# Patient Record
Sex: Female | Born: 1935 | Race: Black or African American | Hispanic: No | Marital: Married | Smoking: Never smoker
Health system: Southern US, Community
[De-identification: ages and names within clinical notes are randomized; demographics above are authoritative.]

## PROBLEM LIST (undated history)

## (undated) DIAGNOSIS — F411 Generalized anxiety disorder: Secondary | ICD-10-CM

## (undated) DIAGNOSIS — Z9889 Other specified postprocedural states: Secondary | ICD-10-CM

## (undated) DIAGNOSIS — K219 Gastro-esophageal reflux disease without esophagitis: Secondary | ICD-10-CM

## (undated) DIAGNOSIS — M1990 Unspecified osteoarthritis, unspecified site: Secondary | ICD-10-CM

## (undated) DIAGNOSIS — I4891 Unspecified atrial fibrillation: Secondary | ICD-10-CM

## (undated) DIAGNOSIS — F329 Major depressive disorder, single episode, unspecified: Secondary | ICD-10-CM

## (undated) DIAGNOSIS — M545 Low back pain, unspecified: Secondary | ICD-10-CM

## (undated) DIAGNOSIS — R112 Nausea with vomiting, unspecified: Secondary | ICD-10-CM

## (undated) DIAGNOSIS — I10 Essential (primary) hypertension: Secondary | ICD-10-CM

## (undated) DIAGNOSIS — E079 Disorder of thyroid, unspecified: Secondary | ICD-10-CM

## (undated) DIAGNOSIS — E7800 Pure hypercholesterolemia, unspecified: Secondary | ICD-10-CM

## (undated) DIAGNOSIS — R1310 Dysphagia, unspecified: Secondary | ICD-10-CM

## (undated) DIAGNOSIS — H9190 Unspecified hearing loss, unspecified ear: Secondary | ICD-10-CM

## (undated) DIAGNOSIS — F419 Anxiety disorder, unspecified: Secondary | ICD-10-CM

## (undated) DIAGNOSIS — M25551 Pain in right hip: Secondary | ICD-10-CM

## (undated) DIAGNOSIS — F32A Depression, unspecified: Secondary | ICD-10-CM

## (undated) DIAGNOSIS — G8929 Other chronic pain: Secondary | ICD-10-CM

## (undated) HISTORY — PX: KNEE ARTHROSCOPY: SHX127

## (undated) HISTORY — PX: ELBOW SURGERY: SHX618

## (undated) HISTORY — PX: ABDOMINAL HYSTERECTOMY: SHX81

---

## 1996-10-27 HISTORY — PX: BREAST LUMPECTOMY: SHX2

## 2001-03-20 ENCOUNTER — Emergency Department (HOSPITAL_COMMUNITY): Admission: EM | Admit: 2001-03-20 | Discharge: 2001-03-20 | Payer: Self-pay | Admitting: Emergency Medicine

## 2001-03-20 ENCOUNTER — Encounter: Payer: Self-pay | Admitting: Emergency Medicine

## 2002-01-13 ENCOUNTER — Encounter: Payer: Self-pay | Admitting: Internal Medicine

## 2002-01-13 ENCOUNTER — Ambulatory Visit (HOSPITAL_COMMUNITY): Admission: RE | Admit: 2002-01-13 | Discharge: 2002-01-13 | Payer: Self-pay | Admitting: Internal Medicine

## 2002-01-18 ENCOUNTER — Ambulatory Visit (HOSPITAL_COMMUNITY): Admission: RE | Admit: 2002-01-18 | Discharge: 2002-01-18 | Payer: Self-pay | Admitting: Internal Medicine

## 2002-01-18 ENCOUNTER — Encounter: Payer: Self-pay | Admitting: Internal Medicine

## 2002-04-13 ENCOUNTER — Ambulatory Visit (HOSPITAL_COMMUNITY): Admission: RE | Admit: 2002-04-13 | Discharge: 2002-04-13 | Payer: Self-pay | Admitting: General Surgery

## 2002-11-09 ENCOUNTER — Ambulatory Visit (HOSPITAL_COMMUNITY): Admission: RE | Admit: 2002-11-09 | Discharge: 2002-11-09 | Payer: Self-pay | Admitting: Internal Medicine

## 2002-11-09 ENCOUNTER — Encounter: Payer: Self-pay | Admitting: Internal Medicine

## 2004-06-18 ENCOUNTER — Ambulatory Visit (HOSPITAL_COMMUNITY): Admission: RE | Admit: 2004-06-18 | Discharge: 2004-06-18 | Payer: Self-pay | Admitting: General Surgery

## 2005-07-01 ENCOUNTER — Ambulatory Visit (HOSPITAL_COMMUNITY): Admission: RE | Admit: 2005-07-01 | Discharge: 2005-07-01 | Payer: Self-pay | Admitting: General Surgery

## 2006-07-27 ENCOUNTER — Ambulatory Visit (HOSPITAL_COMMUNITY): Admission: RE | Admit: 2006-07-27 | Discharge: 2006-07-27 | Payer: Self-pay | Admitting: Internal Medicine

## 2006-10-27 HISTORY — PX: TOTAL KNEE ARTHROPLASTY: SHX125

## 2007-08-06 ENCOUNTER — Ambulatory Visit (HOSPITAL_COMMUNITY): Admission: RE | Admit: 2007-08-06 | Discharge: 2007-08-06 | Payer: Self-pay | Admitting: Internal Medicine

## 2008-07-21 ENCOUNTER — Ambulatory Visit (HOSPITAL_COMMUNITY): Admission: RE | Admit: 2008-07-21 | Discharge: 2008-07-21 | Payer: Self-pay | Admitting: Internal Medicine

## 2008-09-06 ENCOUNTER — Ambulatory Visit (HOSPITAL_COMMUNITY): Admission: RE | Admit: 2008-09-06 | Discharge: 2008-09-06 | Payer: Self-pay | Admitting: Internal Medicine

## 2009-02-01 ENCOUNTER — Inpatient Hospital Stay (HOSPITAL_COMMUNITY): Admission: RE | Admit: 2009-02-01 | Discharge: 2009-02-05 | Payer: Self-pay | Admitting: Orthopedic Surgery

## 2009-02-22 ENCOUNTER — Emergency Department (HOSPITAL_COMMUNITY): Admission: EM | Admit: 2009-02-22 | Discharge: 2009-02-22 | Payer: Self-pay | Admitting: Emergency Medicine

## 2009-04-13 ENCOUNTER — Ambulatory Visit (HOSPITAL_COMMUNITY): Admission: RE | Admit: 2009-04-13 | Discharge: 2009-04-13 | Payer: Self-pay | Admitting: Internal Medicine

## 2009-09-25 ENCOUNTER — Ambulatory Visit (HOSPITAL_COMMUNITY): Admission: RE | Admit: 2009-09-25 | Discharge: 2009-09-25 | Payer: Self-pay | Admitting: Internal Medicine

## 2010-09-23 ENCOUNTER — Ambulatory Visit (HOSPITAL_COMMUNITY)
Admission: RE | Admit: 2010-09-23 | Discharge: 2010-09-23 | Payer: Self-pay | Source: Home / Self Care | Admitting: Internal Medicine

## 2010-11-12 ENCOUNTER — Ambulatory Visit (HOSPITAL_COMMUNITY)
Admission: RE | Admit: 2010-11-12 | Discharge: 2010-11-12 | Payer: Self-pay | Source: Home / Self Care | Attending: Orthopedic Surgery | Admitting: Orthopedic Surgery

## 2010-11-21 ENCOUNTER — Ambulatory Visit (HOSPITAL_COMMUNITY)
Admission: RE | Admit: 2010-11-21 | Discharge: 2010-11-21 | Payer: Self-pay | Source: Home / Self Care | Attending: Orthopedic Surgery | Admitting: Orthopedic Surgery

## 2010-11-21 LAB — BUN: BUN: 24 mg/dL — ABNORMAL HIGH (ref 6–23)

## 2010-11-21 LAB — CREATININE, SERUM
GFR calc Af Amer: 53 mL/min — ABNORMAL LOW (ref >60)
GFR calc non Af Amer: 44 mL/min — ABNORMAL LOW (ref >60)

## 2010-12-04 ENCOUNTER — Ambulatory Visit (HOSPITAL_COMMUNITY)
Admission: RE | Admit: 2010-12-04 | Discharge: 2010-12-04 | Disposition: A | Discharge status: Home / Self Care | Payer: MEDICARE | Source: Ambulatory Visit | Attending: Orthopedic Surgery | Admitting: Orthopedic Surgery

## 2010-12-04 DIAGNOSIS — M6281 Muscle weakness (generalized): Secondary | ICD-10-CM | POA: Insufficient documentation

## 2010-12-04 DIAGNOSIS — M545 Low back pain, unspecified: Secondary | ICD-10-CM | POA: Insufficient documentation

## 2010-12-04 DIAGNOSIS — IMO0001 Reserved for inherently not codable concepts without codable children: Secondary | ICD-10-CM | POA: Insufficient documentation

## 2010-12-04 DIAGNOSIS — R262 Difficulty in walking, not elsewhere classified: Secondary | ICD-10-CM | POA: Insufficient documentation

## 2010-12-04 DIAGNOSIS — I10 Essential (primary) hypertension: Secondary | ICD-10-CM | POA: Insufficient documentation

## 2010-12-10 ENCOUNTER — Ambulatory Visit (HOSPITAL_COMMUNITY)
Admission: RE | Admit: 2010-12-10 | Discharge: 2010-12-10 | Disposition: A | Discharge status: Home / Self Care | Payer: MEDICARE | Source: Ambulatory Visit | Attending: Orthopedic Surgery | Admitting: Orthopedic Surgery

## 2010-12-10 DIAGNOSIS — IMO0001 Reserved for inherently not codable concepts without codable children: Secondary | ICD-10-CM | POA: Insufficient documentation

## 2010-12-10 DIAGNOSIS — M545 Low back pain, unspecified: Secondary | ICD-10-CM | POA: Insufficient documentation

## 2010-12-10 DIAGNOSIS — M6281 Muscle weakness (generalized): Secondary | ICD-10-CM | POA: Insufficient documentation

## 2010-12-12 ENCOUNTER — Ambulatory Visit (HOSPITAL_COMMUNITY)
Admission: RE | Admit: 2010-12-12 | Discharge: 2010-12-12 | Disposition: A | Discharge status: Home / Self Care | Payer: MEDICARE | Source: Ambulatory Visit | Admitting: Physical Therapy

## 2011-01-09 ENCOUNTER — Ambulatory Visit (HOSPITAL_COMMUNITY)
Admission: RE | Admit: 2011-01-09 | Discharge: 2011-01-09 | Disposition: A | Discharge status: Home / Self Care | Payer: MEDICARE | Source: Ambulatory Visit | Attending: Orthopedic Surgery | Admitting: Orthopedic Surgery

## 2011-01-09 DIAGNOSIS — M6281 Muscle weakness (generalized): Secondary | ICD-10-CM | POA: Insufficient documentation

## 2011-01-09 DIAGNOSIS — M545 Low back pain, unspecified: Secondary | ICD-10-CM | POA: Insufficient documentation

## 2011-01-09 DIAGNOSIS — I10 Essential (primary) hypertension: Secondary | ICD-10-CM | POA: Insufficient documentation

## 2011-01-09 DIAGNOSIS — IMO0001 Reserved for inherently not codable concepts without codable children: Secondary | ICD-10-CM | POA: Insufficient documentation

## 2011-01-14 ENCOUNTER — Ambulatory Visit (HOSPITAL_COMMUNITY)
Admission: RE | Admit: 2011-01-14 | Discharge: 2011-01-14 | Disposition: A | Discharge status: Home / Self Care | Payer: MEDICARE | Source: Ambulatory Visit | Attending: *Deleted | Admitting: *Deleted

## 2011-01-16 ENCOUNTER — Ambulatory Visit (HOSPITAL_COMMUNITY)
Admission: RE | Admit: 2011-01-16 | Discharge: 2011-01-16 | Disposition: A | Discharge status: Home / Self Care | Payer: MEDICARE | Source: Ambulatory Visit | Attending: *Deleted | Admitting: *Deleted

## 2011-01-21 ENCOUNTER — Ambulatory Visit (HOSPITAL_COMMUNITY): Payer: Self-pay

## 2011-01-23 ENCOUNTER — Ambulatory Visit (HOSPITAL_COMMUNITY): Payer: Self-pay

## 2011-02-05 LAB — URINALYSIS, ROUTINE W REFLEX MICROSCOPIC
Hgb urine dipstick: NEGATIVE
Ketones, ur: NEGATIVE mg/dL
Nitrite: NEGATIVE
Specific Gravity, Urine: 1.021 (ref 1.005–1.030)
pH: 5.5 (ref 5.0–8.0)

## 2011-02-05 LAB — CROSSMATCH: ABO/RH(D): A POS

## 2011-02-05 LAB — COMPREHENSIVE METABOLIC PANEL
ALT: 9 U/L (ref 0–35)
Alkaline Phosphatase: 114 U/L (ref 39–117)
CO2: 24 mEq/L (ref 19–32)
Calcium: 8.9 mg/dL (ref 8.4–10.5)
Creatinine, Ser: 1.43 mg/dL — ABNORMAL HIGH (ref 0.4–1.2)
GFR calc Af Amer: 42 mL/min — ABNORMAL LOW (ref >60)
Glucose, Bld: 96 mg/dL (ref 70–99)

## 2011-02-05 LAB — CBC
Hemoglobin: 8 g/dL — ABNORMAL LOW (ref 12.0–15.0)
MCHC: 33.5 g/dL (ref 30.0–36.0)
MCHC: 33.5 g/dL (ref 30.0–36.0)
MCV: 90.5 fL (ref 78.0–100.0)
MCV: 90.6 fL (ref 78.0–100.0)
MCV: 92 fL (ref 78.0–100.0)
Platelets: 125 10*3/uL — ABNORMAL LOW (ref 150–400)
Platelets: 194 10*3/uL (ref 150–400)
RBC: 2.22 MIL/uL — ABNORMAL LOW (ref 3.87–5.11)
RBC: 2.6 MIL/uL — ABNORMAL LOW (ref 3.87–5.11)
RDW: 16.2 % — ABNORMAL HIGH (ref 11.5–15.5)
RDW: 16.2 % — ABNORMAL HIGH (ref 11.5–15.5)
WBC: 13.1 10*3/uL — ABNORMAL HIGH (ref 4.0–10.5)
WBC: 13.4 10*3/uL — ABNORMAL HIGH (ref 4.0–10.5)
WBC: 16.5 10*3/uL — ABNORMAL HIGH (ref 4.0–10.5)

## 2011-02-05 LAB — APTT: aPTT: 29 seconds (ref 24–37)

## 2011-02-05 LAB — PROTIME-INR
INR: 1 (ref 0.00–1.49)
INR: 1.7 — ABNORMAL HIGH (ref 0.00–1.49)
INR: 2 — ABNORMAL HIGH (ref 0.00–1.49)
Prothrombin Time: 15.4 seconds — ABNORMAL HIGH (ref 11.6–15.2)
Prothrombin Time: 21.1 seconds — ABNORMAL HIGH (ref 11.6–15.2)

## 2011-02-05 LAB — ABO/RH: ABO/RH(D): A POS

## 2011-02-05 LAB — GLUCOSE, CAPILLARY: Glucose-Capillary: 122 mg/dL — ABNORMAL HIGH (ref 70–99)

## 2011-02-05 LAB — TYPE AND SCREEN

## 2011-03-11 NOTE — Op Note (Signed)
NAME:  BEUNA, BOLDING NO.:  [id number]   MEDICAL RECORD NO.:  [id number]          PATIENT TYPE:  INP   LOCATION:  5002                         FACILITY:  [facility]   PHYSICIAN:  Myrtie Neither, MD      DATE OF BIRTH:  03-01-1921   DATE OF PROCEDURE:  02/01/2009  DATE OF DISCHARGE:                               OPERATIVE REPORT   PREOPERATIVE DIAGNOSIS:  Degenerative joint disease, left knee.   POSTOPERATIVE DIAGNOSIS:  Degenerative joint disease, left knee.   SURGEON:  Myrtie Neither, MD   ANESTHESIA:  General.   PROCEDURE:  Left total knee arthroplasty, Biomet implant.   The patient was taken to the operating room.  After given adequate preop  medications, given general anesthesia and intubated.  Left lower  extremity was prepped with DuraPrep and draped in sterile manner.  Tourniquet and Bovie was used for hemostasis.  Anterior midline incision  made over the left knee going on the tibial tubercle to the quadriceps.  Sharp and blunt dissection was made both medial and laterally down to  the capsule and medial paramedian incision was made into the capsule.  Patella was reflected laterally.  Extensive degenerative joint changes  were noted.  Osteophytes about the patella, femur, and tibia were  resected.  Soft tissue resection was also done and soft tissue release  medially due to her varus deformity.  With the knee in a flexed  position, tibial cutting jig was put in place and tibial plateau soft  surface resected.  Reaming down the femoral canal was then done, and  distal femoral cutting jig was put in place and distal femoral cut was  made.  Sizing of the femur was that of 70 mm.  Appropriate cutting block  was put in place.  Anterior and posterior femoral cuts as well as  chamfer cuts were made.  Other soft tissue resected about the femur was  also done.  Trial component was put in place and found to fit very snug.  Attention was turned back to the tibia.   Tibial surface was measured at  75 mm.  Appropriate cutting jig was put in place and appropriate cut for  the tibia was made.  With the femur and tibial trial components in  place, 2 more millimeters of the tibial surface was necessary to be  resected.  Finally with the femoral trial in place and 75 mm tibial tray  in place with a 12-mm poly, full extension, full flexion, good medial  and lateral stability.  Attention was turned to the patella, which was  sized large, 37 mm but very thin.  Appropriate cutting jig was put in  place.  Trial put in place.  Range of motion, full flexion, full  extension with subluxation of the patella laterally.  Lateral release of  the patella was necessary in order to get the patella to proper track.  All 3 trial components were removed.  Copious irrigation with irrigator  was done.  Cement was mixed.  Tibia and patellar components were  cemented, and the femoral component was press fitted.  Again  after the  cement had fit and excess methyl methacrylate removed, range of motion,  full flexion, full extension, good medial and lateral stability, and  good tracking of the patella.  Final polyethylene was put into place and  locked, which was 12 mm.  Tourniquet was let down, was released.  Hemostasis was obtained with a Bovie.  Further irrigation was done.  Wound closure was then done with some effort to put into  tightening the medial capsule.  A 0 Vicryl for the capsule, 2-0 for the  subcutaneous, skin staples for the skin.  Compressive dressing was  applied.  The patient tolerated the procedure quite well, went to  recovery room in stable and satisfactory condition.      Myrtie Neither, MD  Electronically Signed     Myrtie Neither, MD  Electronically Signed    AC/MEDQ  D:  02/01/2009  T:  02/02/2009  Job:  [id number]

## 2011-03-14 NOTE — Discharge Summary (Signed)
Michaela Tyler, SCHEIDERER NO.:  [id number]   MEDICAL RECORD NO.:  [id number]          PATIENT TYPE:  INP   LOCATION:  5002                         FACILITY:  [facility]   PHYSICIAN:  Myrtie Neither, MD      DATE OF BIRTH:  Oct 17, 1921   DATE OF ADMISSION:  02/01/2009  DATE OF DISCHARGE:  02/05/2009                               DISCHARGE SUMMARY   ADMITTING DIAGNOSIS:  Degenerative joint disease, left knee.   DISCHARGE DIAGNOSIS:  Degenerative joint disease, left knee.   COMPLICATIONS:  None.   INFECTIONS:  None.   OPERATION:  Left total knee arthroplasty done on February 01, 2009.   PERTINENT HISTORY:  This is an 77 year old female followed in the office  for severe degenerative joint disease involving her left knee, treated  with therapeutic injections, anti-inflammatories, pain medication, and  use of cane.  The patient's stability of the left knee progressively  worsened leading to increased loss of function.  Examination of the left  knee genu valgum +2 effusion, crepitus medial and lateral compartment  with some gross instability and valgus laxity.  Negative Homans test.  Neurovascular status was intact.  X-rays revealed slight subluxation of  the femur off of the tibia, sclerosis, osteophytes, and both medial and  lateral compartments.   HOSPITAL COURSE:  The patient underwent preop laboratory of CBC, EKG,  chest x-ray, PT/PTT, UA, and CMET.  The patient's laboratory was found  to be stable enough to undergo surgery.  The patient also had preop  medical evaluation by primary care doctor.   HOSPITAL COURSE:  The patient underwent left total knee arthroplasty on  February 01, 2009, tolerated procedure quite well.  Postop course, the  patient did develop acute blood loss anemia, was given transfusion and  started with physical therapy, partial weightbearing on the left side,  use of CPM, OT therapy, and case management evaluation.  The patient's  pain was brought under  control with the use of Percocet 1-2 q.4 p.r.n.  for pain, partial weightbearing on the left side with the use of walker.  The patient became stable enough to be discharged and was discharged  with home health and PT arranged, INR's.   DISCHARGE MEDICATIONS:  1. Continued on Coumadin 6 mg daily.  2. Robaxin 500 mg b.i.d.  3. Percocet 1-2 q.6 h. p.r.n. and to return to the office in 1 week.   DISCHARGE CONDITION:  The patient is discharged in stable and  satisfactory condition.      Myrtie Neither, MD  Electronically Signed     Myrtie Neither, MD  Electronically Signed    AC/MEDQ  D:  03/14/2009  T:  03/14/2009  Job:  [id number]

## 2011-03-14 NOTE — H&P (Signed)
[facility]  Patient:    Michaela Tyler, Michaela Tyler Visit Number: [id number] MRN: [id number]          Service Type: Attending:  Elpidio Anis, M.D. Dictated by:   Elpidio Anis, M.D. Adm. Date:  04/13/02                           History and Physical  HISTORY OF PRESENT ILLNESS:  The patient is a 77 year old female, with a history of colon polyps in the past.  She has no recent change in bowel habits, no rectal bleeding.  Hemoccults were negative last year but none have been done during this year.  No family history of colon cancer.  The patient is scheduled for colonoscopy.  PAST MEDICAL HISTORY:  1. Hypertension.  2. Gastroesophageal reflux disease.  3. Osteoarthritis.  4. Chronic anxiety disorder.  5. Hyperlipidemia.  MEDICATIONS:  1. Aciphex 20 mg q.d.  2. DynaCirc CR 10 mg q.d.  3. Alprazolam 0.5 mg t.i.d.  4. Arthrotec 75 mg q.d.  5. Zoloft 50 mg 1/2 tablet q.d.  6. Lipitor 10 mg q.d.  7. Lozol 1.25 mg q.d.  PAST SURGICAL HISTORY:  1. Hysterectomy.  2. Knee surgery.  3. Elbow surgery.  PHYSICAL EXAMINATION:  VITAL SIGNS:  Blood pressure 140/88, pulse 92, respirations 20.  Weight 293 pounds.  HEENT:  Unremarkable.  NECK:  Supple without JVD or bruits.  CHEST:  Clear to auscultation.  HEART:  Regular rate and rhythm without murmur, gallop, or rub.  ABDOMEN:  Soft, nontender, obese.  No masses.  RECTAL:  Previously normal, not repeated.  EXTREMITIES:  Decreased range of motion of both knees with crepitus bilaterally.  No effusion.  NEUROLOGIC:  No motor, sensory, or cerebellar deficit.  IMPRESSION:  History of colon polyps.  PLAN:  Colonoscopy. Dictated by:   Elpidio Anis, M.D. Attending:  Elpidio Anis, M.D. DD:  04/13/02 TD:  04/13/02 Job: [id number]

## 2012-02-07 ENCOUNTER — Emergency Department (HOSPITAL_COMMUNITY)
Admission: EM | Admit: 2012-02-07 | Discharge: 2012-02-07 | Disposition: A | Discharge status: Home / Self Care | Payer: Medicare Other | Attending: Emergency Medicine | Admitting: Emergency Medicine

## 2012-02-07 ENCOUNTER — Emergency Department (HOSPITAL_COMMUNITY): Payer: Medicare Other

## 2012-02-07 ENCOUNTER — Encounter (HOSPITAL_COMMUNITY): Payer: Self-pay | Admitting: *Deleted

## 2012-02-07 DIAGNOSIS — IMO0002 Reserved for concepts with insufficient information to code with codable children: Secondary | ICD-10-CM | POA: Insufficient documentation

## 2012-02-07 DIAGNOSIS — W010XXA Fall on same level from slipping, tripping and stumbling without subsequent striking against object, initial encounter: Secondary | ICD-10-CM | POA: Insufficient documentation

## 2012-02-07 DIAGNOSIS — S0083XA Contusion of other part of head, initial encounter: Secondary | ICD-10-CM

## 2012-02-07 DIAGNOSIS — Z23 Encounter for immunization: Secondary | ICD-10-CM | POA: Insufficient documentation

## 2012-02-07 DIAGNOSIS — E7800 Pure hypercholesterolemia, unspecified: Secondary | ICD-10-CM | POA: Insufficient documentation

## 2012-02-07 DIAGNOSIS — R51 Headache: Secondary | ICD-10-CM | POA: Insufficient documentation

## 2012-02-07 DIAGNOSIS — I10 Essential (primary) hypertension: Secondary | ICD-10-CM | POA: Insufficient documentation

## 2012-02-07 DIAGNOSIS — S0003XA Contusion of scalp, initial encounter: Secondary | ICD-10-CM | POA: Insufficient documentation

## 2012-02-07 DIAGNOSIS — W19XXXA Unspecified fall, initial encounter: Secondary | ICD-10-CM

## 2012-02-07 HISTORY — DX: Pure hypercholesterolemia, unspecified: E78.00

## 2012-02-07 HISTORY — DX: Essential (primary) hypertension: I10

## 2012-02-07 MED ORDER — TETANUS-DIPHTH-ACELL PERTUSSIS 5-2.5-18.5 LF-MCG/0.5 IM SUSP
0.5000 mL | Freq: Once | INTRAMUSCULAR | Status: AC
  Administered 2012-02-07: 0.5 mL via INTRAMUSCULAR
  Filled 2012-02-07: qty 0.5

## 2012-02-07 NOTE — ED Notes (Signed)
Pt to CT scan via stretcher.

## 2012-02-07 NOTE — ED Notes (Signed)
Pt states that she hit her foot on the side walk and it caused her to fall. She hit her face on her cheekbone and beside her right eye. Pt has abrasion to the right side of her eye. Pt denies loss of consciousness.

## 2012-02-07 NOTE — ED Provider Notes (Addendum)
History   This chart was scribed for Michaela Jakes, MD by Michaela Tyler. The patient was seen in room APA14/APA14 and the patient's care was started at 4:43 PM     CSN: [id number]  Arrival date & time 02/07/12  1417   First MD Initiated Contact with Patient 02/07/12 1623      Chief Complaint  Patient presents with  . Fall    (Consider location/radiation/quality/duration/timing/severity/associated sxs/prior treatment) HPI  Michaela Tyler is a 78 y.o. female who presents to the Emergency Department complaining of moderate, episodic fall onset today with associated symptoms of headache. The pt states "she fell, broke her glasses, while impacting the ground with the right side of her face". Pt has a hx of allergies, taking Lipitor. Pt denies LOC, neck pain, back pain, abd pain, fever, chills, sore throat, congestion, cough, SOB, nausea, vomiting, diarrhea, dysuria, visual changes since the fall, rash, bleeding problems, taking blood thinners.  PCP is Michaela Tyler.   Past Medical History  Diagnosis Date  . Hypertension   . High cholesterol     Past Surgical History  Procedure Date  . Abdominal hysterectomy   . Total knee arthroplasty   . Elbow surgery       History  Substance Use Topics  . Smoking status: Never Smoker   . Smokeless tobacco: Not on file  . Alcohol Use: No    OB History    Grav Para Term Preterm Abortions TAB SAB Ect Mult Living                  Review of Systems  All other systems reviewed and are negative.    10 Systems reviewed and all are negative for acute change except as noted in the HPI.    Allergies  Review of patient's allergies indicates no known allergies.  Home Medications  No current outpatient prescriptions on file.  BP 182/85  Pulse 74  Temp(Src) 97.6 F (36.4 C) (Oral)  Resp 20  Ht 5\' 9"  (1.753 m)  Wt 250 lb (113.399 kg)  BMI 36.92 kg/m2  SpO2 99%  Physical Exam  Nursing note and vitals reviewed. Constitutional:  She is oriented to person, place, and time. She appears well-developed and well-nourished.  HENT:  Right Ear: Tympanic membrane and external ear normal.  Left Ear: Tympanic membrane and external ear normal.  Nose: Nose normal.  Mouth/Throat: Oropharynx is clear and moist.       Brushing, abrasion 4 CM on the right cheek and lateral to the right eye. No hyphema, Sclera clear. Eyes track well.   Eyes: EOM are normal. Pupils are equal, round, and reactive to light.  Neck: Normal range of motion.  Cardiovascular: Normal rate, regular rhythm and normal heart sounds.  Exam reveals no gallop and no friction rub.   No murmur heard. Pulmonary/Chest: Effort normal and breath sounds normal. She has no wheezes. She has no rales.  Abdominal: Soft. Bowel sounds are normal.  Musculoskeletal: Normal range of motion. She exhibits no edema.  Lymphadenopathy:    She has no cervical adenopathy.  Neurological: She is alert and oriented to person, place, and time.  Skin: Skin is warm and dry.  Psychiatric: She has a normal mood and affect. Her behavior is normal.    ED Course  Procedures (including critical care time)  DIAGNOSTIC STUDIES: Oxygen Saturation is 99% on room air, normal by my interpretation.    COORDINATION OF CARE:     Labs Reviewed - No  data to display Ct Head Wo Contrast  02/07/2012  *RADIOLOGY REPORT*  Clinical Data:  Fall, hit right periorbital area  CT HEAD WITHOUT CONTRAST CT MAXILLOFACIAL WITHOUT CONTRAST  Technique:  Multidetector CT imaging of the head and maxillofacial structures were performed using the standard protocol without intravenous contrast. Multiplanar CT image reconstructions of the maxillofacial structures were also generated.  Comparison:  CT head dated 01/18/2002  CT HEAD  Findings: No evidence of parenchymal hemorrhage or extra-axial fluid collection. No mass lesion, mass effect, or midline shift.  No CT evidence of acute infarction.  Mild subcortical white matter  and periventricular small vessel ischemic changes.  Intracranial atherosclerosis.  Cerebral volume is age appropriate.  No ventriculomegaly.  The visualized paranasal sinuses are essentially clear. The mastoid air cells are unopacified.  No evidence of calvarial fracture.  IMPRESSION: No evidence of acute intracranial abnormality.  Mild small vessel ischemic changes with intracranial atherosclerosis.  CT MAXILLOFACIAL  Findings:   Soft tissue swelling lateral to the right orbit/zygoma (series 4/image 21).  No evidence of maxillofacial fracture.  The bilateral orbits, including the globes and retroconal soft tissues, are within normal limits.  Mild mucosal thickening in the bilateral maxillary sinuses.  The visualized paranasal sinuses and mastoid air cells are otherwise clear.  The cervical spine appears intact to C6-7.  Moderate multilevel degenerative changes.  IMPRESSION:  Soft tissue swelling lateral to the right orbit/zygoma.  Underlying globe/orbit is within normal limits.  No evidence of maxillofacial fracture.  Original Report Authenticated By: Charline Bills, M.D.   Ct Maxillofacial Wo Cm  02/07/2012  *RADIOLOGY REPORT*  Clinical Data:  Fall, hit right periorbital area  CT HEAD WITHOUT CONTRAST CT MAXILLOFACIAL WITHOUT CONTRAST  Technique:  Multidetector CT imaging of the head and maxillofacial structures were performed using the standard protocol without intravenous contrast. Multiplanar CT image reconstructions of the maxillofacial structures were also generated.  Comparison:  CT head dated 01/18/2002  CT HEAD  Findings: No evidence of parenchymal hemorrhage or extra-axial fluid collection. No mass lesion, mass effect, or midline shift.  No CT evidence of acute infarction.  Mild subcortical white matter and periventricular small vessel ischemic changes.  Intracranial atherosclerosis.  Cerebral volume is age appropriate.  No ventriculomegaly.  The visualized paranasal sinuses are essentially clear.  The mastoid air cells are unopacified.  No evidence of calvarial fracture.  IMPRESSION: No evidence of acute intracranial abnormality.  Mild small vessel ischemic changes with intracranial atherosclerosis.  CT MAXILLOFACIAL  Findings:   Soft tissue swelling lateral to the right orbit/zygoma (series 4/image 21).  No evidence of maxillofacial fracture.  The bilateral orbits, including the globes and retroconal soft tissues, are within normal limits.  Mild mucosal thickening in the bilateral maxillary sinuses.  The visualized paranasal sinuses and mastoid air cells are otherwise clear.  The cervical spine appears intact to C6-7.  Moderate multilevel degenerative changes.  IMPRESSION:  Soft tissue swelling lateral to the right orbit/zygoma.  Underlying globe/orbit is within normal limits.  No evidence of maxillofacial fracture.  Original Report Authenticated By: Charline Bills, M.D.     1. Fall   2. Facial contusion     4:50PM- EDP at bedside discusses treatment plan.   MDM   Fall resulting in contusion to right cheek right lateral part of I CT scans of head and face without any significant abnormalities. No other complaints of injury. No loss of consciousness.patient's tetanus is up-to-date.      I personally performed the services described in  this documentation, which was scribed in my presence. The recorded information has been reviewed and considered.     Michaela Jakes, MD 02/07/12 1810  Michaela Jakes, MD 02/07/12 [phone]

## 2012-02-07 NOTE — ED Notes (Signed)
Pt presents with swelling, pain and redness to right cheek bone. Per pt she fell and hit face on the ground. Pt denies LOC. No bleeding noted. Small laceration noted to right cheek bone just below eye. Pt a/ox4. Resp even and unlabored. NAD at this time. Pt sitting supine in stretcher. Stretcher in low locked position. Side rail up for pt safety. Call light within reach. Education on plan of care provided. Pt verbalized understanding. Awaiting MD evaluation and orders. Will continue to monitor.

## 2012-02-07 NOTE — Discharge Instructions (Signed)
Return to the emergency apartment as needed. And wash wounds with soap and water. Return for any evidence of developing infection. Followup with her primary care doctor as needed. CT scans today were all negative.

## 2012-03-08 ENCOUNTER — Encounter (HOSPITAL_COMMUNITY): Payer: Self-pay | Admitting: Pharmacist

## 2012-03-12 MED ORDER — FENTANYL CITRATE 0.05 MG/ML IJ SOLN
INTRAMUSCULAR | Status: AC
  Filled 2012-03-12: qty 2

## 2012-03-16 ENCOUNTER — Encounter (HOSPITAL_COMMUNITY): Payer: Self-pay

## 2012-03-16 ENCOUNTER — Encounter (HOSPITAL_COMMUNITY)
Admission: RE | Admit: 2012-03-16 | Discharge: 2012-03-16 | Disposition: A | Discharge status: Home / Self Care | Payer: Medicare Other | Source: Ambulatory Visit | Attending: Ophthalmology | Admitting: Ophthalmology

## 2012-03-16 HISTORY — DX: Nausea with vomiting, unspecified: R11.2

## 2012-03-16 HISTORY — DX: Depression, unspecified: F32.A

## 2012-03-16 HISTORY — DX: Major depressive disorder, single episode, unspecified: F32.9

## 2012-03-16 HISTORY — DX: Anxiety disorder, unspecified: F41.9

## 2012-03-16 HISTORY — DX: Gastro-esophageal reflux disease without esophagitis: K21.9

## 2012-03-16 HISTORY — DX: Unspecified osteoarthritis, unspecified site: M19.90

## 2012-03-16 HISTORY — DX: Unspecified hearing loss, unspecified ear: H91.90

## 2012-03-16 HISTORY — DX: Nausea with vomiting, unspecified: Z98.890

## 2012-03-16 LAB — BASIC METABOLIC PANEL
GFR calc Af Amer: 41 mL/min — ABNORMAL LOW (ref >90)
GFR calc non Af Amer: 36 mL/min — ABNORMAL LOW (ref >90)
Potassium: 4.3 mEq/L (ref 3.5–5.1)
Sodium: 138 mEq/L (ref 135–145)

## 2012-03-16 LAB — HEMOGLOBIN AND HEMATOCRIT, BLOOD: HCT: 33.2 % — ABNORMAL LOW (ref 36.0–46.0)

## 2012-03-16 MED ORDER — FLURBIPROFEN SODIUM 0.03 % OP SOLN: 1.0000 [drp] | OPHTHALMIC | Status: DC

## 2012-03-16 NOTE — Patient Instructions (Signed)
Your procedure is scheduled on:  Tuesday, 03/23/12  Report to [facility] at   0700 AM.  Call this number if you have problems the morning of surgery: [phone]   Remember:   Do not eat or drink   After Midnight.  Take these medicines the morning of surgery with A SIP OF WATER: Lotrel, xanax, and pain pill if needed   Do not wear jewelry, make-up or nail polish.  Do not wear lotions, powders, or perfumes. You may wear deodorant.  Do not bring valuables to the hospital.  Contacts, dentures or bridgework may not be worn into surgery.     Patients discharged the day of surgery will not be allowed to drive home.  Name and phone number of your driver: driver  Special Instructions: Use eye drops as directed.   Please read over the following fact sheets that you were given: Pain Booklet, Anesthesia Post-op Instructions and Care and Recovery After Surgery    Cataract Surgery  A cataract is a clouding of the lens of the eye. When a lens becomes cloudy, vision is reduced based on the degree and nature of the clouding. Surgery may be needed to improve vision. Surgery removes the cloudy lens and usually replaces it with a substitute lens (intraocular lens, IOL). LET YOUR EYE DOCTOR KNOW ABOUT:  Allergies to food or medicine.   Medicines taken including herbs, eyedrops, over-the-counter medicines, and creams.   Use of steroids (by mouth or creams).   Previous problems with anesthetics or numbing medicine.   History of bleeding problems or blood clots.   Previous surgery.   Other health problems, including diabetes and kidney problems.   Possibility of pregnancy, if this applies.  RISKS AND COMPLICATIONS  Infection.   Inflammation of the eyeball (endophthalmitis) that can spread to both eyes (sympathetic ophthalmia).   Poor wound healing.   If an IOL is inserted, it can later fall out of proper position. This is very uncommon.   Clouding of the part of your eye that holds an IOL in  place. This is called an "after-cataract." These are uncommon, but easily treated.  BEFORE THE PROCEDURE  Do not eat or drink anything except small amounts of water for 8 to 12 before your surgery, or as directed by your caregiver.   Unless you are told otherwise, continue any eyedrops you have been prescribed.   Talk to your primary caregiver about all other medicines that you take (both prescription and non-prescription). In some cases, you may need to stop or change medicines near the time of your surgery. This is most important if you are taking blood-thinning medicine.Do not stop medicines unless you are told to do so.   Arrange for someone to drive you to and from the procedure.   Do not put contact lenses in either eye on the day of your surgery.  PROCEDURE There is more than one method for safely removing a cataract. Your doctor can explain the differences and help determine which is best for you. Phacoemulsification surgery is the most common form of cataract surgery.  An injection is given behind the eye or eyedrops are given to make this a painless procedure.   A small cut (incision) is made on the edge of the clear, dome-shaped surface that covers the front of the eye (cornea).   A tiny probe is painlessly inserted into the eye. This device gives off ultrasound waves that soften and break up the cloudy center of the lens. This  makes it easier for the cloudy lens to be removed by suction.   An IOL may be implanted.   The normal lens of the eye is covered by a clear capsule. Part of that capsule is intentionally left in the eye to support the IOL.   Your surgeon may or may not use stitches to close the incision.  There are other forms of cataract surgery that require a larger incision and stiches to close the eye. This approach is taken in cases where the doctor feels that the cataract cannot be easily removed using phacoemulsification. AFTER THE PROCEDURE  When an IOL is  implanted, it does not need care. It becomes a permanent part of your eye and cannot be seen or felt.   Your doctor will schedule follow-up exams to check on your progress.   Review your other medicines with your doctor to see which can be resumed after surgery.   Use eyedrops or take medicine as prescribed by your doctor.  Document Released: 10/02/2011 Document Reviewed: 09/29/2011 [facility] Patient Information 2012 [place], [place].  PATIENT INSTRUCTIONS POST-ANESTHESIA  IMMEDIATELY FOLLOWING SURGERY:  Do not drive or operate machinery for the first twenty four hours after surgery.  Do not make any important decisions for twenty four hours after surgery or while taking narcotic pain medications or sedatives.  If you develop intractable nausea and vomiting or a severe headache please notify your doctor immediately.  FOLLOW-UP:  Please make an appointment with your surgeon as instructed. You do not need to follow up with anesthesia unless specifically instructed to do so.  WOUND CARE INSTRUCTIONS (if applicable):  Keep a dry clean dressing on the anesthesia/puncture wound site if there is drainage.  Once the wound has quit draining you may leave it open to air.  Generally you should leave the bandage intact for twenty four hours unless there is drainage.  If the epidural site drains for more than 36-48 hours please call the anesthesia department.  QUESTIONS?:  Please feel free to call your physician or the hospital operator if you have any questions, and they will be happy to assist you.

## 2012-03-23 ENCOUNTER — Encounter (HOSPITAL_COMMUNITY): Payer: Self-pay | Admitting: Anesthesiology

## 2012-03-23 ENCOUNTER — Encounter (HOSPITAL_COMMUNITY)
Admission: RE | Disposition: A | Discharge status: Home / Self Care | Payer: Self-pay | Source: Ambulatory Visit | Attending: Ophthalmology

## 2012-03-23 ENCOUNTER — Ambulatory Visit (HOSPITAL_COMMUNITY): Payer: Medicare Other | Admitting: Anesthesiology

## 2012-03-23 ENCOUNTER — Ambulatory Visit (HOSPITAL_COMMUNITY)
Admission: RE | Admit: 2012-03-23 | Discharge: 2012-03-23 | Disposition: A | Discharge status: Home / Self Care | Payer: Medicare Other | Source: Ambulatory Visit | Attending: Ophthalmology | Admitting: Ophthalmology

## 2012-03-23 ENCOUNTER — Encounter (HOSPITAL_COMMUNITY): Payer: Self-pay | Admitting: *Deleted

## 2012-03-23 DIAGNOSIS — Z79899 Other long term (current) drug therapy: Secondary | ICD-10-CM | POA: Insufficient documentation

## 2012-03-23 DIAGNOSIS — I10 Essential (primary) hypertension: Secondary | ICD-10-CM | POA: Insufficient documentation

## 2012-03-23 DIAGNOSIS — H2510 Age-related nuclear cataract, unspecified eye: Secondary | ICD-10-CM | POA: Insufficient documentation

## 2012-03-23 DIAGNOSIS — Z01812 Encounter for preprocedural laboratory examination: Secondary | ICD-10-CM | POA: Insufficient documentation

## 2012-03-23 HISTORY — PX: CATARACT EXTRACTION W/PHACO: SHX586

## 2012-03-23 SURGERY — PHACOEMULSIFICATION, CATARACT, WITH IOL INSERTION
Anesthesia: Monitor Anesthesia Care | Site: Eye | Laterality: Right | Wound class: Clean

## 2012-03-23 MED ORDER — LACTATED RINGERS IV SOLN: INTRAVENOUS | Status: DC

## 2012-03-23 MED ORDER — FLURBIPROFEN SODIUM 0.03 % OP SOLN
OPHTHALMIC | Status: AC
  Administered 2012-03-23: 1 [drp] via OPHTHALMIC
  Filled 2012-03-23: qty 2.5

## 2012-03-23 MED ORDER — CYCLOPENTOLATE-PHENYLEPHRINE 0.2-1 % OP SOLN
1.0000 [drp] | OPHTHALMIC | Status: AC
  Administered 2012-03-23 (×3): 1 [drp] via OPHTHALMIC

## 2012-03-23 MED ORDER — ONDANSETRON HCL 4 MG/2ML IJ SOLN: 4.0000 mg | Freq: Once | INTRAMUSCULAR | Status: DC

## 2012-03-23 MED ORDER — PHENYLEPHRINE HCL 2.5 % OP SOLN
OPHTHALMIC | Status: AC
  Administered 2012-03-23: 1 [drp] via OPHTHALMIC
  Filled 2012-03-23: qty 2

## 2012-03-23 MED ORDER — TETRACAINE 0.5 % OP SOLN OPTIME - NO CHARGE
OPHTHALMIC | Status: DC | PRN
  Administered 2012-03-23: 2 [drp] via OPHTHALMIC

## 2012-03-23 MED ORDER — MIDAZOLAM HCL 2 MG/2ML IJ SOLN
INTRAMUSCULAR | Status: AC
  Administered 2012-03-23: 2 mg via INTRAVENOUS
  Filled 2012-03-23: qty 2

## 2012-03-23 MED ORDER — LACTATED RINGERS IV SOLN
INTRAVENOUS | Status: DC
  Administered 2012-03-23: 08:00:00 via INTRAVENOUS

## 2012-03-23 MED ORDER — PHENYLEPHRINE HCL 2.5 % OP SOLN
1.0000 [drp] | OPHTHALMIC | Status: AC
  Administered 2012-03-23 (×3): 1 [drp] via OPHTHALMIC

## 2012-03-23 MED ORDER — CYCLOPENTOLATE-PHENYLEPHRINE 0.2-1 % OP SOLN
OPHTHALMIC | Status: AC
  Administered 2012-03-23: 1 [drp] via OPHTHALMIC
  Filled 2012-03-23: qty 2

## 2012-03-23 MED ORDER — ONDANSETRON HCL 4 MG/2ML IJ SOLN
INTRAMUSCULAR | Status: AC
  Filled 2012-03-23: qty 2

## 2012-03-23 MED ORDER — BSS IO SOLN
INTRAOCULAR | Status: DC | PRN
  Administered 2012-03-23: 15 mL via INTRAOCULAR

## 2012-03-23 MED ORDER — FLURBIPROFEN SODIUM 0.03 % OP SOLN
1.0000 [drp] | OPHTHALMIC | Status: AC
  Administered 2012-03-23 (×3): 1 [drp] via OPHTHALMIC

## 2012-03-23 MED ORDER — TETRACAINE HCL 0.5 % OP SOLN
1.0000 [drp] | OPHTHALMIC | Status: AC
  Administered 2012-03-23 (×3): 1 [drp] via OPHTHALMIC

## 2012-03-23 MED ORDER — ONDANSETRON HCL 4 MG/2ML IJ SOLN
4.0000 mg | Freq: Once | INTRAMUSCULAR | Status: AC
  Administered 2012-03-23: 4 mg via INTRAVENOUS

## 2012-03-23 MED ORDER — TETRACAINE HCL 0.5 % OP SOLN
OPHTHALMIC | Status: AC
  Administered 2012-03-23: 1 [drp] via OPHTHALMIC
  Filled 2012-03-23: qty 2

## 2012-03-23 MED ORDER — MIDAZOLAM HCL 2 MG/2ML IJ SOLN: 1.0000 mg | INTRAMUSCULAR | Status: DC | PRN

## 2012-03-23 MED ORDER — EPINEPHRINE HCL 1 MG/ML IJ SOLN
INTRAOCULAR | Status: DC | PRN
  Administered 2012-03-23: 08:00:00

## 2012-03-23 MED ORDER — PROVISC 10 MG/ML IO SOLN
INTRAOCULAR | Status: DC | PRN
  Administered 2012-03-23: 8.5 mg via INTRAOCULAR

## 2012-03-23 MED ORDER — MIDAZOLAM HCL 2 MG/2ML IJ SOLN
1.0000 mg | INTRAMUSCULAR | Status: AC | PRN
  Administered 2012-03-23: 2 mg via INTRAVENOUS
  Administered 2012-03-23: 1 mg via INTRAVENOUS
  Administered 2012-03-23: 2 mg via INTRAVENOUS

## 2012-03-23 SURGICAL SUPPLY — 22 items
CAPSULAR TENSION RING-AMO (OPHTHALMIC RELATED) IMPLANT
CLOTH BEACON ORANGE TIMEOUT ST (SAFETY) ×1 IMPLANT
EYE SHIELD UNIVERSAL CLEAR (GAUZE/BANDAGES/DRESSINGS) ×2 IMPLANT
GLOVE BIO SURGEON STRL SZ 6.5 (GLOVE) ×1 IMPLANT
GLOVE ECLIPSE 6.5 STRL STRAW (GLOVE) IMPLANT
GLOVE ECLIPSE 7.0 STRL STRAW (GLOVE) IMPLANT
GLOVE EXAM NITRILE LRG STRL (GLOVE) IMPLANT
GLOVE EXAM NITRILE MD LF STRL (GLOVE) ×1 IMPLANT
GLOVE SKINSENSE NS SZ6.5 (GLOVE)
GLOVE SKINSENSE STRL SZ6.5 (GLOVE) IMPLANT
HEALON 5 0.6 ML (INTRAOCULAR LENS) IMPLANT
KIT VITRECTOMY (OPHTHALMIC RELATED) IMPLANT
PAD ARMBOARD 7.5X6 YLW CONV (MISCELLANEOUS) ×1 IMPLANT
PROC W NO LENS (INTRAOCULAR LENS)
PROC W SPEC LENS (INTRAOCULAR LENS)
PROCESS W NO LENS (INTRAOCULAR LENS) IMPLANT
PROCESS W SPEC LENS (INTRAOCULAR LENS) IMPLANT
RING MALYGIN (MISCELLANEOUS) IMPLANT
SIGHTPATH CAT PROC W REG LENS (Ophthalmic Related) ×2 IMPLANT
TAPE CLOTH SOFT 2X10 (GAUZE/BANDAGES/DRESSINGS) ×1 IMPLANT
VISCOELASTIC ADDITIONAL (OPHTHALMIC RELATED) IMPLANT
WATER STERILE IRR 250ML POUR (IV SOLUTION) ×1 IMPLANT

## 2012-03-23 NOTE — Anesthesia Postprocedure Evaluation (Signed)
  Anesthesia Post-op Note  Patient: Michaela Tyler  Procedure(s) Performed: Procedure(s) (LRB): CATARACT EXTRACTION PHACO AND INTRAOCULAR LENS PLACEMENT (IOC) (Right)  Patient Location: Short Stay  Anesthesia Type: MAC  Level of Consciousness: awake  Airway and Oxygen Therapy: Patient Spontanous Breathing  Post-op Pain: none  Post-op Assessment: Post-op Vital signs reviewed, Patient's Cardiovascular Status Stable, Respiratory Function Stable, Patent Airway and No signs of Nausea or vomiting  Post-op Vital Signs: Reviewed and stable  Complications: No apparent anesthesia complications

## 2012-03-23 NOTE — Transfer of Care (Signed)
Immediate Anesthesia Transfer of Care Note  Patient: Michaela Tyler  Procedure(s) Performed: Procedure(s) (LRB): CATARACT EXTRACTION PHACO AND INTRAOCULAR LENS PLACEMENT (IOC) (Right)  Patient Location: PACU and Short Stay  Anesthesia Type: MAC  Level of Consciousness: awake, alert  and oriented  Airway & Oxygen Therapy: Patient Spontanous Breathing  Post-op Assessment: Report given to PACU RN  Post vital signs: Reviewed and stable  Complications: No apparent anesthesia complications

## 2012-03-23 NOTE — Anesthesia Preprocedure Evaluation (Signed)
Anesthesia Evaluation  Patient identified by MRN, date of birth, ID band Patient awake    Reviewed: Allergy & Precautions, H&P , NPO status , Patient's Chart, lab work & pertinent test results  History of Anesthesia Complications (+) PONV  Airway Mallampati: II      Dental  (+) Edentulous Upper and Edentulous Lower   Pulmonary neg pulmonary ROS,  breath sounds clear to auscultation        Cardiovascular hypertension, Pt. on medications Rhythm:Regular     Neuro/Psych PSYCHIATRIC DISORDERS Anxiety Depression    GI/Hepatic GERD-  Medicated and Controlled,  Endo/Other    Renal/GU      Musculoskeletal   Abdominal   Peds  Hematology   Anesthesia Other Findings   Reproductive/Obstetrics                           Anesthesia Physical Anesthesia Plan  ASA: II  Anesthesia Plan: MAC   Post-op Pain Management:    Induction: Intravenous  Airway Management Planned: Nasal Cannula  Additional Equipment:   Intra-op Plan:   Post-operative Plan:   Informed Consent: I have reviewed the patients History and Physical, chart, labs and discussed the procedure including the risks, benefits and alternatives for the proposed anesthesia with the patient or authorized representative who has indicated his/her understanding and acceptance.     Plan Discussed with:   Anesthesia Plan Comments:         Anesthesia Quick Evaluation  

## 2012-03-23 NOTE — Discharge Instructions (Signed)
Michaela Tyler  03/23/2012           [facility] Instructions [address]  [address]      1. Avoid closing eyes tightly. One often closes the eye tightly when laughing, talking, sneezing, coughing or if they feel irritated. At these times, you should be careful not to close your eyes tightly.  2. Instill eye drops as instructed. To instill drops in your eye, open it, look up and have someone gently pull the lower lid down and instill a couple of drops inside the lower lid.  3. Do not touch upper lid.  4. Take Advil or Tylenol for pain.  5. You may use either eye for near work, such as reading or sewing and you may watch television.  6. You may have your hair done at the beauty parlor at any time.  7. Wear dark glasses with or without your own glasses if you are in bright light.  8. Call our office at [phone] or [phone] if you have sharp pain in your eye or unusual symptoms.  9. Do not be concerned because vision in the operative eye is not good. It will not be good, no matter how successful the operation, until you get a special lens for it. Your old glasses will not be suited to the new eye that was operated on and you will not be ready for a new lens for about a month.  10. Follow up at the [facility] office today @ 2:30-3:00pm.    I have received a copy of the above instructions and will follow them.

## 2012-03-23 NOTE — Op Note (Signed)
Patient brought to the operating room and prepped and draped in the usual manner.  Lid speculum inserted in right eye.  Stab incision made at the twelve o'clock position.  Provisc instilled in the anterior chamber.   A 2.4 mm. Stab incision was made temporally.  An anterior capsulotomy was done with a bent 25 gauge needle.  The nucleus was hydrodissected.  The Phaco tip was inserted in the anterior chamber and the nucleus was emulsified.  CDE was 12.73.  The cortical material was then removed with the I and A tip.  Posterior capsule was the polished.  The anterior chamber was deepened with Provisc.  A 19.5 Diopter Rayner 570C IOL was then inserted in the capsular bag.  Provisc was then removed with the I and A tip.  The wound was then hydrated.  Patient sent to the Recovery Room in good condition with follow up in my office.

## 2012-03-23 NOTE — Brief Op Note (Signed)
The patient was re examined and there is no change in the patients condition since the original H and P. 

## 2012-03-25 ENCOUNTER — Encounter (HOSPITAL_COMMUNITY): Payer: Self-pay | Admitting: Ophthalmology

## 2012-04-16 ENCOUNTER — Encounter (HOSPITAL_COMMUNITY): Payer: Self-pay | Admitting: Pharmacy Technician

## 2012-04-16 ENCOUNTER — Encounter (HOSPITAL_COMMUNITY)
Admission: RE | Admit: 2012-04-16 | Discharge: 2012-04-16 | Payer: Medicare Other | Source: Ambulatory Visit | Admitting: Ophthalmology

## 2012-04-19 MED ORDER — ONDANSETRON HCL 4 MG/2ML IJ SOLN: 4.0000 mg | Freq: Once | INTRAMUSCULAR | Status: AC | PRN

## 2012-04-19 MED ORDER — FENTANYL CITRATE 0.05 MG/ML IJ SOLN: 25.0000 ug | INTRAMUSCULAR | Status: DC | PRN

## 2012-04-20 ENCOUNTER — Ambulatory Visit (HOSPITAL_COMMUNITY): Payer: Medicare Other | Admitting: Anesthesiology

## 2012-04-20 ENCOUNTER — Encounter (HOSPITAL_COMMUNITY): Payer: Self-pay | Admitting: Anesthesiology

## 2012-04-20 ENCOUNTER — Ambulatory Visit (HOSPITAL_COMMUNITY)
Admission: RE | Admit: 2012-04-20 | Discharge: 2012-04-20 | Disposition: A | Discharge status: Home / Self Care | Payer: Medicare Other | Source: Ambulatory Visit | Attending: Ophthalmology | Admitting: Ophthalmology

## 2012-04-20 ENCOUNTER — Encounter (HOSPITAL_COMMUNITY): Payer: Self-pay | Admitting: *Deleted

## 2012-04-20 ENCOUNTER — Encounter (HOSPITAL_COMMUNITY)
Admission: RE | Disposition: A | Discharge status: Home / Self Care | Payer: Self-pay | Source: Ambulatory Visit | Attending: Ophthalmology

## 2012-04-20 DIAGNOSIS — I10 Essential (primary) hypertension: Secondary | ICD-10-CM | POA: Insufficient documentation

## 2012-04-20 DIAGNOSIS — H2510 Age-related nuclear cataract, unspecified eye: Secondary | ICD-10-CM | POA: Insufficient documentation

## 2012-04-20 DIAGNOSIS — Z79899 Other long term (current) drug therapy: Secondary | ICD-10-CM | POA: Insufficient documentation

## 2012-04-20 HISTORY — PX: CATARACT EXTRACTION W/PHACO: SHX586

## 2012-04-20 SURGERY — PHACOEMULSIFICATION, CATARACT, WITH IOL INSERTION
Anesthesia: Monitor Anesthesia Care | Site: Eye | Laterality: Left | Wound class: Clean

## 2012-04-20 MED ORDER — EPINEPHRINE HCL 1 MG/ML IJ SOLN
INTRAOCULAR | Status: DC | PRN
  Administered 2012-04-20: 09:00:00

## 2012-04-20 MED ORDER — LACTATED RINGERS IV SOLN
INTRAVENOUS | Status: DC
  Administered 2012-04-20: 08:00:00 via INTRAVENOUS

## 2012-04-20 MED ORDER — PHENYLEPHRINE HCL 2.5 % OP SOLN
OPHTHALMIC | Status: AC
  Administered 2012-04-20: 1 [drp] via OPHTHALMIC
  Filled 2012-04-20: qty 2

## 2012-04-20 MED ORDER — MIDAZOLAM HCL 2 MG/2ML IJ SOLN
INTRAMUSCULAR | Status: AC
  Administered 2012-04-20: 2 mg via INTRAVENOUS
  Filled 2012-04-20: qty 2

## 2012-04-20 MED ORDER — PHENYLEPHRINE HCL 2.5 % OP SOLN
1.0000 [drp] | OPHTHALMIC | Status: AC
  Administered 2012-04-20 (×3): 1 [drp] via OPHTHALMIC

## 2012-04-20 MED ORDER — TETRACAINE HCL 0.5 % OP SOLN
OPHTHALMIC | Status: AC
  Administered 2012-04-20: 1 [drp] via OPHTHALMIC
  Filled 2012-04-20: qty 2

## 2012-04-20 MED ORDER — PROVISC 10 MG/ML IO SOLN
INTRAOCULAR | Status: DC | PRN
  Administered 2012-04-20: 8.5 mg via INTRAOCULAR

## 2012-04-20 MED ORDER — FLURBIPROFEN SODIUM 0.03 % OP SOLN
1.0000 [drp] | OPHTHALMIC | Status: AC | PRN
  Administered 2012-04-20 (×3): 1 [drp] via OPHTHALMIC

## 2012-04-20 MED ORDER — MIDAZOLAM HCL 2 MG/2ML IJ SOLN
INTRAMUSCULAR | Status: AC
  Filled 2012-04-20: qty 2

## 2012-04-20 MED ORDER — BSS IO SOLN
INTRAOCULAR | Status: DC | PRN
  Administered 2012-04-20: 15 mL via INTRAOCULAR

## 2012-04-20 MED ORDER — FLURBIPROFEN SODIUM 0.03 % OP SOLN
OPHTHALMIC | Status: AC
  Administered 2012-04-20: 1 [drp] via OPHTHALMIC
  Filled 2012-04-20: qty 2.5

## 2012-04-20 MED ORDER — CYCLOPENTOLATE-PHENYLEPHRINE 0.2-1 % OP SOLN
1.0000 [drp] | OPHTHALMIC | Status: AC
  Administered 2012-04-20 (×3): 1 [drp] via OPHTHALMIC

## 2012-04-20 MED ORDER — EPINEPHRINE HCL 1 MG/ML IJ SOLN
INTRAMUSCULAR | Status: AC
  Filled 2012-04-20: qty 1

## 2012-04-20 MED ORDER — MIDAZOLAM HCL 2 MG/2ML IJ SOLN
1.0000 mg | INTRAMUSCULAR | Status: DC | PRN
  Administered 2012-04-20 (×2): 2 mg via INTRAVENOUS

## 2012-04-20 MED ORDER — TETRACAINE HCL 0.5 % OP SOLN
1.0000 [drp] | OPHTHALMIC | Status: AC
  Administered 2012-04-20 (×3): 1 [drp] via OPHTHALMIC

## 2012-04-20 MED ORDER — CYCLOPENTOLATE-PHENYLEPHRINE 0.2-1 % OP SOLN
OPHTHALMIC | Status: AC
  Administered 2012-04-20: 1 [drp] via OPHTHALMIC
  Filled 2012-04-20: qty 2

## 2012-04-20 SURGICAL SUPPLY — 23 items

## 2012-04-20 NOTE — Discharge Instructions (Signed)
Michaela Tyler  04/20/2012           [facility] Instructions [address]- [place] [address]      1. Avoid closing eyes tightly. One often closes the eye tightly when laughing, talking, sneezing, coughing or if they feel irritated. At these times, you should be careful not to close your eyes tightly.  2. Instill eye drops as instructed. To instill drops in your eye, open it, look up and have someone gently pull the lower lid down and instill a couple of drops inside the lower lid.  3. Do not touch upper lid.  4. Take Advil or Tylenol for pain.  5. You may use either eye for near work, such as reading or sewing and you may watch television.  6. You may have your hair done at the beauty parlor at any time.  7. Wear dark glasses with or without your own glasses if you are in bright light.  8. Call our office at [phone] or [phone] if you have sharp pain in your eye or unusual symptoms.  9. Do not be concerned because vision in the operative eye is not good. It will not be good, no matter how successful the operation, until you get a special lens for it. Your old glasses will not be suited to the new eye that was operated on and you will not be ready for a new lens for about a month.  10. Follow up at the [facility] office.    I have received a copy of the above instructions and will follow them.

## 2012-04-20 NOTE — Op Note (Signed)
Patient brought to the operating room and prepped and draped in the usual manner.  Lid speculum inserted in left eye.  Stab incision made at the twelve o'clock position.  Provisc instilled in the anterior chamber.   A 2.4 mm. Stab incision was made temporally.  An anterior capsulotomy was done with a bent 25 gauge needle.  The nucleus was hydrodissected.  The Phaco tip was inserted in the anterior chamber and the nucleus was emulsified.  CDE was 7.70.  The cortical material was then removed with the I and A tip.  Posterior capsule was the polished.  The anterior chamber was deepened with Provisc.  A 21.0 Diopter Rayner 570C IOL was then inserted in the capsular bag.  Provisc was then removed with the I and A tip.  The wound was then hydrated.  Patient sent to the Recovery Room in good condition with follow up in my office.

## 2012-04-20 NOTE — Anesthesia Postprocedure Evaluation (Signed)
  Anesthesia Post-op Note  Patient: Michaela Tyler  Procedure(s) Performed: Procedure(s) (LRB): CATARACT EXTRACTION PHACO AND INTRAOCULAR LENS PLACEMENT (IOC) (Left)  Patient Location: PACU and Short Stay  Anesthesia Type: MAC  Level of Consciousness: awake, alert , oriented and patient cooperative  Airway and Oxygen Therapy: Patient Spontanous Breathing  Post-op Pain: none  Post-op Assessment: Post-op Vital signs reviewed, Patient's Cardiovascular Status Stable, Respiratory Function Stable, Patent Airway, No signs of Nausea or vomiting and Pain level controlled  Post-op Vital Signs: Reviewed and stable  Complications: No apparent anesthesia complications

## 2012-04-20 NOTE — Transfer of Care (Signed)
Immediate Anesthesia Transfer of Care Note  Patient: Michaela Tyler  Procedure(s) Performed: Procedure(s) (LRB): CATARACT EXTRACTION PHACO AND INTRAOCULAR LENS PLACEMENT (IOC) (Left)  Patient Location: PACU and Short Stay  Anesthesia Type: MAC  Level of Consciousness: awake, alert , oriented and patient cooperative  Airway & Oxygen Therapy: Patient Spontanous Breathing  Post-op Assessment: Report given to PACU RN, Post -op Vital signs reviewed and stable and Patient moving all extremities  Post vital signs: Reviewed and stable  Complications: No apparent anesthesia complications

## 2012-04-20 NOTE — H&P (Signed)
The patient was re examined and there is no change in the patients condition since the original H and P. 

## 2012-04-20 NOTE — Anesthesia Preprocedure Evaluation (Signed)
Anesthesia Evaluation  Patient identified by MRN, date of birth, ID band Patient awake    Reviewed: Allergy & Precautions, H&P , NPO status , Patient's Chart, lab work & pertinent test results  History of Anesthesia Complications (+) PONV  Airway Mallampati: II      Dental  (+) Edentulous Upper and Edentulous Lower   Pulmonary neg pulmonary ROS,  breath sounds clear to auscultation        Cardiovascular hypertension, Pt. on medications Rhythm:Regular     Neuro/Psych PSYCHIATRIC DISORDERS Anxiety Depression    GI/Hepatic GERD-  Medicated and Controlled,  Endo/Other    Renal/GU      Musculoskeletal   Abdominal   Peds  Hematology   Anesthesia Other Findings   Reproductive/Obstetrics                           Anesthesia Physical Anesthesia Plan  ASA: II  Anesthesia Plan: MAC   Post-op Pain Management:    Induction: Intravenous  Airway Management Planned: Nasal Cannula  Additional Equipment:   Intra-op Plan:   Post-operative Plan:   Informed Consent: I have reviewed the patients History and Physical, chart, labs and discussed the procedure including the risks, benefits and alternatives for the proposed anesthesia with the patient or authorized representative who has indicated his/her understanding and acceptance.     Plan Discussed with:   Anesthesia Plan Comments:         Anesthesia Quick Evaluation

## 2012-04-21 ENCOUNTER — Encounter (HOSPITAL_COMMUNITY): Payer: Self-pay | Admitting: Ophthalmology

## 2012-07-23 ENCOUNTER — Other Ambulatory Visit (HOSPITAL_COMMUNITY): Payer: Self-pay | Admitting: Internal Medicine

## 2012-07-23 DIAGNOSIS — Z139 Encounter for screening, unspecified: Secondary | ICD-10-CM

## 2012-07-27 ENCOUNTER — Ambulatory Visit (HOSPITAL_COMMUNITY)
Admission: RE | Admit: 2012-07-27 | Discharge: 2012-07-27 | Disposition: A | Discharge status: Home / Self Care | Payer: Medicare Other | Source: Ambulatory Visit | Attending: Internal Medicine | Admitting: Internal Medicine

## 2012-07-27 DIAGNOSIS — Z139 Encounter for screening, unspecified: Secondary | ICD-10-CM

## 2012-07-27 DIAGNOSIS — Z1231 Encounter for screening mammogram for malignant neoplasm of breast: Secondary | ICD-10-CM | POA: Insufficient documentation

## 2012-12-24 ENCOUNTER — Other Ambulatory Visit: Payer: Self-pay

## 2012-12-24 ENCOUNTER — Ambulatory Visit (HOSPITAL_COMMUNITY)
Admission: RE | Admit: 2012-12-24 | Discharge: 2012-12-24 | Disposition: A | Discharge status: Home / Self Care | Payer: Medicare Other | Source: Ambulatory Visit | Attending: Internal Medicine | Admitting: Internal Medicine

## 2013-01-12 ENCOUNTER — Encounter: Payer: Self-pay | Admitting: Cardiology

## 2013-01-12 ENCOUNTER — Ambulatory Visit (INDEPENDENT_AMBULATORY_CARE_PROVIDER_SITE_OTHER): Payer: Medicare Other | Admitting: Cardiology

## 2013-01-12 VITALS — BP 138/80 | HR 93 | Ht 68.0 in | Wt 260.0 lb

## 2013-01-12 DIAGNOSIS — I4891 Unspecified atrial fibrillation: Secondary | ICD-10-CM

## 2013-01-12 DIAGNOSIS — I447 Left bundle-branch block, unspecified: Secondary | ICD-10-CM

## 2013-01-12 DIAGNOSIS — Z9181 History of falling: Secondary | ICD-10-CM | POA: Insufficient documentation

## 2013-01-12 DIAGNOSIS — I482 Chronic atrial fibrillation, unspecified: Secondary | ICD-10-CM | POA: Insufficient documentation

## 2013-01-12 NOTE — Progress Notes (Signed)
HPI Michaela Tyler is a very pleasant yet anxious 79 year old married black female referred today by Dr. Felecia Shelling for atrial fibrillation,  palpitations, and left bundle-branch block.  He states that she will get anxious sometimes at night. She is not sure what triggers it. She will take a Xanax which seems to help. The anxiety is associated with a feeling that she can't get a good breath and her heart is speeding up. She denies any chest pain, presyncope or syncope. She has fallen 13 times in the past year. She is not on Coumadin. She is a left bundle branch block on her last couple EKGs.  She uses a cane. She lives with her husband independently.  Past Medical History  Diagnosis Date  . Hypertension   . High cholesterol   . GERD (gastroesophageal reflux disease)   . Arthritis   . Anxiety   . Depression   . Cataract   . Hearing reduced   . PONV (postoperative nausea and vomiting)   . H/O partial mastectomy   . Cancer     left breast    Current Outpatient Prescriptions  Medication Sig Dispense Refill  . ALPRAZolam (XANAX) 0.5 MG tablet Take 0.5 mg by mouth 3 (three) times daily as needed. For anxiety      . amLODipine (NORVASC) 5 MG tablet Take 5 mg by mouth daily.      Marland Kitchen aspirin EC 81 MG tablet Take 81 mg by mouth daily.      Marland Kitchen atorvastatin (LIPITOR) 40 MG tablet Take 40 mg by mouth daily.      . ferrous sulfate 325 (65 FE) MG tablet Take 325 mg by mouth daily with breakfast.      . HYDROcodone-acetaminophen (VICODIN) 5-500 MG per tablet Take 1 tablet by mouth daily. For arthritis pain       . losartan-hydrochlorothiazide (HYZAAR) 50-12.5 MG per tablet Take 1 tablet by mouth daily.       No current facility-administered medications for this visit.    No Known Allergies  No family history on file.  History   Social History  . Marital Status: Married    Spouse Name: N/A    Number of Children: N/A  . Years of Education: N/A   Occupational History  . Not on file.   Social  History Main Topics  . Smoking status: Never Smoker   . Smokeless tobacco: Not on file  . Alcohol Use: No  . Drug Use: No  . Sexually Active:    Other Topics Concern  . Not on file   Social History Narrative  . No narrative on file    ROS ALL NEGATIVE EXCEPT THOSE NOTED IN HPI  PE  General Appearance: well developed, well nourished in no acute distress, morbidly obese HEENT: symmetrical face, PERRLA, dentures Neck: no JVD, thyromegaly, or adenopathy, trachea midline Chest: symmetric without deformity Cardiac: PMI non-displaced, irregular rate and rhythm, normal S1, S2, no gallop or murmur Lung: clear to ausculation and percussion Vascular: Decreased pulses in lower extremities Abdominal: nondistended, nontender, good bowel sounds, no HSM, no bruits Extremities: no cyanosis, clubbing or edema, no sign of DVT, no varicosities  Skin: normal color, no rashes Neuro: alert and oriented x 3, non-focal Pysch: Anxious  EKG outside EKG reviewed from February 28 which is a left bundle branch block and a heart rate of 100-105.  BMET    Component Value Date/Time   NA 138 03/16/2012 1030   K 4.3 03/16/2012 1030   CL  103 03/16/2012 1030   CO2 23 03/16/2012 1030   GLUCOSE 109* 03/16/2012 1030   BUN 21 03/16/2012 1030   CREATININE 1.29* 03/16/2012 1030   CALCIUM 9.8 03/16/2012 1030   GFRNONAA 36* 03/16/2012 1030   GFRAA 41* 03/16/2012 1030    Lipid Panel  No results found for this basename: chol, trig, hdl, cholhdl, vldl, ldlcalc    CBC    Component Value Date/Time   WBC 16.5* 02/03/2009 0500   RBC 2.22* 02/03/2009 0500   HGB 10.5* 03/16/2012 1030   HCT 33.2* 03/16/2012 1030   PLT 114* 02/03/2009 0500   MCV 90.5 02/03/2009 0500   MCHC 35.1 02/03/2009 0500   RDW 16.2* 02/03/2009 0500

## 2013-01-12 NOTE — Patient Instructions (Addendum)
Your physician recommends that you schedule a follow-up appointment in: PRN  YOUR PHYSICIAN RECOMMENDS THAT YOU DO NOT GO TO THE EMERGENCY ROOM FOR ANXIETY

## 2013-01-12 NOTE — Assessment & Plan Note (Signed)
Her rate today is in the 80s. Because of her left bundle and at age, I do not want to put her on A-V node blocking drugs. I've encouraged her to take Xanax as needed for her anxiety episodes. This seems to help her symptoms of palpitations and heart speeding up. She is at high risk of falls and has fallen numerous times in the past. She is not a Coumadin candidate. Continue aspirin.  I've encouraged her not to go the emergency room or call 911 for her anxiety spells.

## 2013-11-03 ENCOUNTER — Other Ambulatory Visit (HOSPITAL_COMMUNITY): Payer: Self-pay | Admitting: Internal Medicine

## 2013-11-03 DIAGNOSIS — Z139 Encounter for screening, unspecified: Secondary | ICD-10-CM

## 2013-11-08 ENCOUNTER — Ambulatory Visit (HOSPITAL_COMMUNITY): Payer: Medicare Other

## 2013-11-11 ENCOUNTER — Ambulatory Visit (HOSPITAL_COMMUNITY)
Admission: RE | Admit: 2013-11-11 | Discharge: 2013-11-11 | Disposition: A | Discharge status: Home / Self Care | Payer: Medicare Other | Source: Ambulatory Visit | Attending: Internal Medicine | Admitting: Internal Medicine

## 2013-11-11 DIAGNOSIS — Z1231 Encounter for screening mammogram for malignant neoplasm of breast: Secondary | ICD-10-CM | POA: Insufficient documentation

## 2013-11-11 DIAGNOSIS — Z139 Encounter for screening, unspecified: Secondary | ICD-10-CM

## 2014-10-31 DIAGNOSIS — Z961 Presence of intraocular lens: Secondary | ICD-10-CM | POA: Diagnosis not present

## 2014-10-31 DIAGNOSIS — H26493 Other secondary cataract, bilateral: Secondary | ICD-10-CM | POA: Diagnosis not present

## 2014-10-31 DIAGNOSIS — H3531 Nonexudative age-related macular degeneration: Secondary | ICD-10-CM | POA: Diagnosis not present

## 2014-11-07 ENCOUNTER — Ambulatory Visit (HOSPITAL_COMMUNITY)
Admission: RE | Admit: 2014-11-07 | Discharge: 2014-11-07 | Disposition: A | Discharge status: Home / Self Care | Payer: Medicare Other | Source: Ambulatory Visit | Attending: Ophthalmology | Admitting: Ophthalmology

## 2014-11-07 ENCOUNTER — Encounter (HOSPITAL_COMMUNITY): Payer: Self-pay | Admitting: *Deleted

## 2014-11-07 ENCOUNTER — Encounter (HOSPITAL_COMMUNITY)
Admission: RE | Disposition: A | Discharge status: Home / Self Care | Payer: Self-pay | Source: Ambulatory Visit | Attending: Ophthalmology

## 2014-11-07 DIAGNOSIS — H2640 Unspecified secondary cataract: Secondary | ICD-10-CM | POA: Insufficient documentation

## 2014-11-07 DIAGNOSIS — H26492 Other secondary cataract, left eye: Secondary | ICD-10-CM | POA: Diagnosis not present

## 2014-11-07 HISTORY — PX: YAG LASER APPLICATION: SHX6189

## 2014-11-07 SURGERY — TREATMENT, USING YAG LASER
Anesthesia: LOCAL | Laterality: Left

## 2014-11-07 MED ORDER — TROPICAMIDE 1 % OP SOLN
OPHTHALMIC | Status: AC
  Filled 2014-11-07: qty 3

## 2014-11-07 MED ORDER — TROPICAMIDE 1 % OP SOLN
1.0000 [drp] | OPHTHALMIC | Status: AC
  Administered 2014-11-07 (×3): 1 [drp] via OPHTHALMIC

## 2014-11-07 NOTE — H&P (Signed)
The patient was re examined and there is no change in the patients condition since the original H and P. 

## 2014-11-07 NOTE — Discharge Instructions (Signed)
Michaela Tyler  11/07/2014     Instructions    Activity: No Restrictions.   Diet: Resume Diet you were on at home.   Pain Medication: Tylenol if Needed.   CONTACT YOUR DOCTOR IF YOU HAVE PAIN, REDNESS IN YOUR EYE, OR DECREASED VISION.   Follow-up: today between 1:00-2:00  with Michaela LericheMark [name], MD.   Dr. Nile Tyler: [phone]       If you find that you cannot contact your physician, but feel that your signs and   Symptoms warrant a physician's attention, call the Emergency Room at   [phone] ext.[phone].

## 2014-11-07 NOTE — Op Note (Signed)
Michaela Tyler [name], MD  Procedure: Yag Capsulotomy  Yag Laser Self Test Completedyes. Procedure: Posterior Capsulotomy, Eye Protection Worn by Staff yes. Laser In Use Sign on Door yes.  Laser: Nd:YAG Spot Size: Fixed Burst Mode: III Power Setting: 3.4 mJ/burst Number of shots: 35 Total energy delivered: 117 mJ   The patient tolerated the procedure without difficulty. No complications were encountered.   The patient was discharged home with the instructions to continue all her current glaucoma medications, if any.   Patient instructed to go to office at 0100 for intraocular pressure check.  Patient verbalizes understanding of discharge instructions Yes.  .    Pre-Operative Diagnosis: After-Cataract, obscuring vision, 366.53 OS Post-Operative Diagnosis: After-Cataract, obscuring vision, 366.53 OS

## 2014-11-08 ENCOUNTER — Encounter (HOSPITAL_COMMUNITY): Payer: Self-pay | Admitting: Ophthalmology

## 2014-11-14 DIAGNOSIS — F419 Anxiety disorder, unspecified: Secondary | ICD-10-CM | POA: Diagnosis not present

## 2014-11-14 DIAGNOSIS — E039 Hypothyroidism, unspecified: Secondary | ICD-10-CM | POA: Diagnosis not present

## 2014-11-14 DIAGNOSIS — I10 Essential (primary) hypertension: Secondary | ICD-10-CM | POA: Diagnosis not present

## 2014-11-14 DIAGNOSIS — K219 Gastro-esophageal reflux disease without esophagitis: Secondary | ICD-10-CM | POA: Diagnosis not present

## 2014-11-15 DIAGNOSIS — I10 Essential (primary) hypertension: Secondary | ICD-10-CM | POA: Diagnosis not present

## 2014-11-15 DIAGNOSIS — F419 Anxiety disorder, unspecified: Secondary | ICD-10-CM | POA: Diagnosis not present

## 2014-11-15 DIAGNOSIS — N182 Chronic kidney disease, stage 2 (mild): Secondary | ICD-10-CM | POA: Diagnosis not present

## 2014-11-20 NOTE — Discharge Instructions (Signed)
Michaela Tyler  11/20/2014     Instructions    Activity: No Restrictions.   Diet: Resume Diet you were on at home.   Pain Medication: Tylenol if Needed.   CONTACT YOUR DOCTOR IF YOU HAVE PAIN, REDNESS IN YOUR EYE, OR DECREASED VISION.   Follow-up: today between 2:00-3:00 with [name], MD.   Dr. Nile RiggsShapiro: [phone]      If you find that you cannot contact your physician, but feel that your signs and   Symptoms warrant a physician's attention, call the Emergency Room at   [phone] [phone].

## 2014-11-21 ENCOUNTER — Ambulatory Visit (HOSPITAL_COMMUNITY)
Admission: RE | Admit: 2014-11-21 | Discharge: 2014-11-21 | Disposition: A | Discharge status: Home / Self Care | Payer: Medicare Other | Source: Ambulatory Visit | Attending: Ophthalmology | Admitting: Ophthalmology

## 2014-11-21 ENCOUNTER — Encounter (HOSPITAL_COMMUNITY): Payer: Self-pay | Admitting: *Deleted

## 2014-11-21 ENCOUNTER — Encounter (HOSPITAL_COMMUNITY)
Admission: RE | Disposition: A | Discharge status: Home / Self Care | Payer: Self-pay | Source: Ambulatory Visit | Attending: Ophthalmology

## 2014-11-21 DIAGNOSIS — H26491 Other secondary cataract, right eye: Secondary | ICD-10-CM | POA: Insufficient documentation

## 2014-11-21 HISTORY — PX: YAG LASER APPLICATION: SHX6189

## 2014-11-21 SURGERY — TREATMENT, USING YAG LASER
Anesthesia: LOCAL | Laterality: Right

## 2014-11-21 MED ORDER — TROPICAMIDE 1 % OP SOLN
1.0000 [drp] | OPHTHALMIC | Status: AC
  Administered 2014-11-21 (×3): 1 [drp] via OPHTHALMIC

## 2014-11-21 MED ORDER — TROPICAMIDE 1 % OP SOLN
OPHTHALMIC | Status: AC
  Filled 2014-11-21: qty 3

## 2014-11-21 NOTE — H&P (Signed)
The patient was re examined and there is no change in the patients condition since the original H and P. 

## 2014-11-21 NOTE — Op Note (Signed)
Jacolyn Joaquin [name], MD  Procedure: Yag Capsulotomy  Yag Laser Self Test Completedyes. Procedure: Posterior Capsulotomy, Eye Protection Worn by Staff yes. Laser In Use Sign on Door yes.  Laser: Nd:YAG Spot Size: Fixed Burst Mode: III Power Setting: 3.4 mJ/burst Number of shots: 24 Total energy delivered: 79.95 mJ   The patient tolerated the procedure without difficulty. No complications were encountered.   The patient was discharged home with the instructions to continue all her current glaucoma medications, if any.   Patient instructed to go to office at 0100 for intraocular pressure check.  Patient verbalizes understanding of discharge instructions Yes.  .    Pre-Operative Diagnosis: After-Cataract, obscuring vision, 366.53 OD Post-Operative Diagnosis: After-Cataract, obscuring vision, 366.53 OD

## 2015-01-02 ENCOUNTER — Other Ambulatory Visit (HOSPITAL_COMMUNITY): Payer: Self-pay | Admitting: Internal Medicine

## 2015-01-02 DIAGNOSIS — Z1231 Encounter for screening mammogram for malignant neoplasm of breast: Secondary | ICD-10-CM

## 2015-01-08 ENCOUNTER — Ambulatory Visit (HOSPITAL_COMMUNITY)
Admission: RE | Admit: 2015-01-08 | Discharge: 2015-01-08 | Disposition: A | Discharge status: Home / Self Care | Payer: Medicare Other | Source: Ambulatory Visit | Attending: Internal Medicine | Admitting: Internal Medicine

## 2015-01-08 DIAGNOSIS — Z1231 Encounter for screening mammogram for malignant neoplasm of breast: Secondary | ICD-10-CM

## 2015-01-11 DIAGNOSIS — F419 Anxiety disorder, unspecified: Secondary | ICD-10-CM | POA: Diagnosis not present

## 2015-01-11 DIAGNOSIS — I10 Essential (primary) hypertension: Secondary | ICD-10-CM | POA: Diagnosis not present

## 2015-01-11 DIAGNOSIS — H6090 Unspecified otitis externa, unspecified ear: Secondary | ICD-10-CM | POA: Diagnosis not present

## 2015-01-11 DIAGNOSIS — J309 Allergic rhinitis, unspecified: Secondary | ICD-10-CM | POA: Diagnosis not present

## 2015-02-13 DIAGNOSIS — F329 Major depressive disorder, single episode, unspecified: Secondary | ICD-10-CM | POA: Diagnosis not present

## 2015-02-13 DIAGNOSIS — E039 Hypothyroidism, unspecified: Secondary | ICD-10-CM | POA: Diagnosis not present

## 2015-02-13 DIAGNOSIS — I10 Essential (primary) hypertension: Secondary | ICD-10-CM | POA: Diagnosis not present

## 2015-03-22 DIAGNOSIS — E669 Obesity, unspecified: Secondary | ICD-10-CM | POA: Diagnosis not present

## 2015-03-22 DIAGNOSIS — Z0001 Encounter for general adult medical examination with abnormal findings: Secondary | ICD-10-CM | POA: Diagnosis not present

## 2015-03-22 DIAGNOSIS — N182 Chronic kidney disease, stage 2 (mild): Secondary | ICD-10-CM | POA: Diagnosis not present

## 2015-03-22 DIAGNOSIS — I10 Essential (primary) hypertension: Secondary | ICD-10-CM | POA: Diagnosis not present

## 2015-04-04 DIAGNOSIS — I10 Essential (primary) hypertension: Secondary | ICD-10-CM | POA: Diagnosis not present

## 2015-04-04 DIAGNOSIS — F419 Anxiety disorder, unspecified: Secondary | ICD-10-CM | POA: Diagnosis not present

## 2015-04-04 DIAGNOSIS — N182 Chronic kidney disease, stage 2 (mild): Secondary | ICD-10-CM | POA: Diagnosis not present

## 2015-05-01 DIAGNOSIS — I10 Essential (primary) hypertension: Secondary | ICD-10-CM | POA: Diagnosis not present

## 2015-05-01 DIAGNOSIS — F329 Major depressive disorder, single episode, unspecified: Secondary | ICD-10-CM | POA: Diagnosis not present

## 2015-05-01 DIAGNOSIS — E039 Hypothyroidism, unspecified: Secondary | ICD-10-CM | POA: Diagnosis not present

## 2015-05-31 ENCOUNTER — Ambulatory Visit (HOSPITAL_COMMUNITY)
Admission: RE | Admit: 2015-05-31 | Discharge: 2015-05-31 | Disposition: A | Discharge status: Home / Self Care | Payer: Medicare Other | Source: Ambulatory Visit | Attending: Internal Medicine | Admitting: Internal Medicine

## 2015-05-31 ENCOUNTER — Other Ambulatory Visit (HOSPITAL_COMMUNITY): Payer: Self-pay | Admitting: Internal Medicine

## 2015-05-31 DIAGNOSIS — Z1231 Encounter for screening mammogram for malignant neoplasm of breast: Secondary | ICD-10-CM

## 2015-07-03 DIAGNOSIS — N182 Chronic kidney disease, stage 2 (mild): Secondary | ICD-10-CM | POA: Diagnosis not present

## 2015-07-03 DIAGNOSIS — Z23 Encounter for immunization: Secondary | ICD-10-CM | POA: Diagnosis not present

## 2015-07-03 DIAGNOSIS — M1990 Unspecified osteoarthritis, unspecified site: Secondary | ICD-10-CM | POA: Diagnosis not present

## 2015-07-03 DIAGNOSIS — I10 Essential (primary) hypertension: Secondary | ICD-10-CM | POA: Diagnosis not present

## 2015-07-13 ENCOUNTER — Encounter (HOSPITAL_COMMUNITY): Payer: Self-pay | Admitting: Emergency Medicine

## 2015-07-13 ENCOUNTER — Emergency Department (HOSPITAL_COMMUNITY): Payer: Medicare Other

## 2015-07-13 ENCOUNTER — Observation Stay (HOSPITAL_COMMUNITY)
Admission: EM | Admit: 2015-07-13 | Discharge: 2015-07-15 | Disposition: A | Discharge status: Home / Self Care | Payer: Medicare Other | Attending: Internal Medicine | Admitting: Internal Medicine

## 2015-07-13 DIAGNOSIS — R0989 Other specified symptoms and signs involving the circulatory and respiratory systems: Secondary | ICD-10-CM | POA: Diagnosis not present

## 2015-07-13 DIAGNOSIS — I4891 Unspecified atrial fibrillation: Secondary | ICD-10-CM | POA: Diagnosis not present

## 2015-07-13 DIAGNOSIS — E780 Pure hypercholesterolemia: Secondary | ICD-10-CM | POA: Diagnosis not present

## 2015-07-13 DIAGNOSIS — I482 Chronic atrial fibrillation, unspecified: Secondary | ICD-10-CM

## 2015-07-13 DIAGNOSIS — K219 Gastro-esophageal reflux disease without esophagitis: Secondary | ICD-10-CM | POA: Diagnosis not present

## 2015-07-13 DIAGNOSIS — F419 Anxiety disorder, unspecified: Secondary | ICD-10-CM | POA: Insufficient documentation

## 2015-07-13 DIAGNOSIS — M1990 Unspecified osteoarthritis, unspecified site: Secondary | ICD-10-CM | POA: Diagnosis not present

## 2015-07-13 DIAGNOSIS — F329 Major depressive disorder, single episode, unspecified: Secondary | ICD-10-CM | POA: Insufficient documentation

## 2015-07-13 DIAGNOSIS — H9190 Unspecified hearing loss, unspecified ear: Secondary | ICD-10-CM | POA: Diagnosis not present

## 2015-07-13 DIAGNOSIS — Z79899 Other long term (current) drug therapy: Secondary | ICD-10-CM | POA: Insufficient documentation

## 2015-07-13 DIAGNOSIS — Z7982 Long term (current) use of aspirin: Secondary | ICD-10-CM | POA: Diagnosis not present

## 2015-07-13 DIAGNOSIS — R0602 Shortness of breath: Secondary | ICD-10-CM | POA: Diagnosis not present

## 2015-07-13 LAB — CBC WITH DIFFERENTIAL/PLATELET
BASOS PCT: 0 %
Basophils Absolute: 0 10*3/uL (ref 0.0–0.1)
Eosinophils Absolute: 0.1 10*3/uL (ref 0.0–0.7)
Eosinophils Relative: 3 %
HCT: 32 % — ABNORMAL LOW (ref 36.0–46.0)
HEMOGLOBIN: 10.2 g/dL — AB (ref 12.0–15.0)
Lymphocytes Relative: 16 %
Lymphs Abs: 0.8 10*3/uL (ref 0.7–4.0)
MCH: 29.1 pg (ref 26.0–34.0)
MCHC: 31.9 g/dL (ref 30.0–36.0)
MCV: 91.2 fL (ref 78.0–100.0)
MONOS PCT: 7 %
Monocytes Absolute: 0.3 10*3/uL (ref 0.1–1.0)
NEUTROS ABS: 3.9 10*3/uL (ref 1.7–7.7)
NEUTROS PCT: 74 %
Platelets: 180 10*3/uL (ref 150–400)
RBC: 3.51 MIL/uL — AB (ref 3.87–5.11)
RDW: 14.7 % (ref 11.5–15.5)
WBC: 5.2 10*3/uL (ref 4.0–10.5)

## 2015-07-13 LAB — BASIC METABOLIC PANEL
ANION GAP: 6 (ref 5–15)
BUN: 18 mg/dL (ref 6–20)
CALCIUM: 8.5 mg/dL — AB (ref 8.9–10.3)
CO2: 22 mmol/L (ref 22–32)
Chloride: 106 mmol/L (ref 101–111)
Creatinine, Ser: 1.45 mg/dL — ABNORMAL HIGH (ref 0.44–1.00)
GFR calc non Af Amer: 30 mL/min — ABNORMAL LOW (ref >60)
GFR, EST AFRICAN AMERICAN: 35 mL/min — AB (ref >60)
Glucose, Bld: 106 mg/dL — ABNORMAL HIGH (ref 65–99)
Potassium: 3.9 mmol/L (ref 3.5–5.1)
Sodium: 134 mmol/L — ABNORMAL LOW (ref 135–145)

## 2015-07-13 LAB — I-STAT TROPONIN, ED
TROPONIN I, POC: 0.02 ng/mL (ref 0.00–0.08)
Troponin i, poc: 0.01 ng/mL (ref 0.00–0.08)

## 2015-07-13 LAB — D-DIMER, QUANTITATIVE (NOT AT ARMC): D-Dimer, Quant: 12.85 ug/mL-FEU — ABNORMAL HIGH (ref 0.00–0.48)

## 2015-07-13 LAB — TSH: TSH: 2.136 u[IU]/mL (ref 0.350–4.500)

## 2015-07-13 MED ORDER — ALUM & MAG HYDROXIDE-SIMETH 200-200-20 MG/5ML PO SUSP: 30.0000 mL | Freq: Four times a day (QID) | ORAL | Status: DC | PRN

## 2015-07-13 MED ORDER — HYDROCODONE-ACETAMINOPHEN 5-325 MG PO TABS
1.0000 | ORAL_TABLET | Freq: Two times a day (BID) | ORAL | Status: DC | PRN
  Administered 2015-07-14: 1 via ORAL
  Filled 2015-07-13: qty 1

## 2015-07-13 MED ORDER — METOPROLOL SUCCINATE ER 50 MG PO TB24
50.0000 mg | ORAL_TABLET | Freq: Every day | ORAL | Status: DC
  Administered 2015-07-14 – 2015-07-15 (×2): 50 mg via ORAL
  Filled 2015-07-13 (×2): qty 1

## 2015-07-13 MED ORDER — ALPRAZOLAM 0.5 MG PO TABS
0.5000 mg | ORAL_TABLET | Freq: Two times a day (BID) | ORAL | Status: DC
  Administered 2015-07-13 – 2015-07-15 (×4): 0.5 mg via ORAL
  Filled 2015-07-13 (×4): qty 1

## 2015-07-13 MED ORDER — ACETAMINOPHEN 650 MG RE SUPP: 650.0000 mg | Freq: Four times a day (QID) | RECTAL | Status: DC | PRN

## 2015-07-13 MED ORDER — IOHEXOL 350 MG/ML SOLN
80.0000 mL | Freq: Once | INTRAVENOUS | Status: AC | PRN
  Administered 2015-07-13: 100 mL via INTRAVENOUS

## 2015-07-13 MED ORDER — HYDROCHLOROTHIAZIDE 12.5 MG PO CAPS
12.5000 mg | ORAL_CAPSULE | Freq: Every day | ORAL | Status: DC
  Administered 2015-07-14 – 2015-07-15 (×2): 12.5 mg via ORAL
  Filled 2015-07-13 (×2): qty 1

## 2015-07-13 MED ORDER — DILTIAZEM HCL 25 MG/5ML IV SOLN
INTRAVENOUS | Status: AC
  Filled 2015-07-13: qty 5

## 2015-07-13 MED ORDER — SODIUM CHLORIDE 0.9 % IJ SOLN
3.0000 mL | Freq: Two times a day (BID) | INTRAMUSCULAR | Status: DC
  Administered 2015-07-13 – 2015-07-15 (×3): 3 mL via INTRAVENOUS

## 2015-07-13 MED ORDER — ENOXAPARIN SODIUM 40 MG/0.4ML ~~LOC~~ SOLN
40.0000 mg | SUBCUTANEOUS | Status: DC
  Administered 2015-07-13: 40 mg via SUBCUTANEOUS
  Filled 2015-07-13: qty 0.4

## 2015-07-13 MED ORDER — LOSARTAN POTASSIUM-HCTZ 50-12.5 MG PO TABS
1.0000 | ORAL_TABLET | Freq: Every day | ORAL | Status: DC
Start: 2015-07-13 — End: 2015-07-13

## 2015-07-13 MED ORDER — ASPIRIN EC 81 MG PO TBEC
81.0000 mg | DELAYED_RELEASE_TABLET | Freq: Every day | ORAL | Status: DC
  Administered 2015-07-14 – 2015-07-15 (×2): 81 mg via ORAL
  Filled 2015-07-13 (×2): qty 1

## 2015-07-13 MED ORDER — LEVOTHYROXINE SODIUM 88 MCG PO TABS
88.0000 ug | ORAL_TABLET | Freq: Every day | ORAL | Status: DC
  Administered 2015-07-14 – 2015-07-15 (×2): 88 ug via ORAL
  Filled 2015-07-13 (×2): qty 1

## 2015-07-13 MED ORDER — DILTIAZEM HCL 25 MG/5ML IV SOLN
10.0000 mg | Freq: Once | INTRAVENOUS | Status: AC
  Administered 2015-07-13: 10 mg via INTRAVENOUS

## 2015-07-13 MED ORDER — LOSARTAN POTASSIUM 50 MG PO TABS
50.0000 mg | ORAL_TABLET | Freq: Every day | ORAL | Status: DC
  Administered 2015-07-14 – 2015-07-15 (×2): 50 mg via ORAL
  Filled 2015-07-13 (×2): qty 1

## 2015-07-13 MED ORDER — ATORVASTATIN CALCIUM 40 MG PO TABS
40.0000 mg | ORAL_TABLET | Freq: Every day | ORAL | Status: DC
  Administered 2015-07-14: 40 mg via ORAL
  Filled 2015-07-13: qty 1

## 2015-07-13 MED ORDER — ONDANSETRON HCL 4 MG/2ML IJ SOLN: 4.0000 mg | Freq: Four times a day (QID) | INTRAMUSCULAR | Status: DC | PRN

## 2015-07-13 MED ORDER — PANTOPRAZOLE SODIUM 40 MG PO TBEC
40.0000 mg | DELAYED_RELEASE_TABLET | Freq: Every day | ORAL | Status: DC
  Administered 2015-07-14 – 2015-07-15 (×2): 40 mg via ORAL
  Filled 2015-07-13 (×2): qty 1

## 2015-07-13 MED ORDER — ONDANSETRON HCL 4 MG PO TABS: 4.0000 mg | ORAL_TABLET | Freq: Four times a day (QID) | ORAL | Status: DC | PRN

## 2015-07-13 MED ORDER — CITALOPRAM HYDROBROMIDE 20 MG PO TABS
10.0000 mg | ORAL_TABLET | Freq: Every day | ORAL | Status: DC
  Administered 2015-07-14 – 2015-07-15 (×2): via ORAL
  Filled 2015-07-13 (×2): qty 1

## 2015-07-13 MED ORDER — ACETAMINOPHEN 325 MG PO TABS: 650.0000 mg | ORAL_TABLET | Freq: Four times a day (QID) | ORAL | Status: DC | PRN

## 2015-07-13 NOTE — Discharge Instructions (Signed)
You were found to have normal lungs on CT and xray.  Lieklyt the shortness of breath was because your heart rate

## 2015-07-13 NOTE — ED Provider Notes (Signed)
CSN: [id number]     Arrival date & time 07/13/15  1441 History   First MD Initiated Contact with Patient 07/13/15 1502     Chief Complaint  Patient presents with  . Shortness of Breath     (Consider location/radiation/quality/duration/timing/severity/associated sxs/prior Treatment) HPI  Patient is a very pleasant 81 year old female with history of anxiety hypertension depression presenting today with 1-3 weeks of occasional shortness of breath. Patient went to primary care office today to be evaluated for the shortness of breath was found to have a heart rate in the 140s. On arrival here her heart rate is normal. Patient appears to have A. fib on her most recent EKG. Patient is on metoprolol home. She states that she is occasionally having these episodes of shortness of breath no palpitations no chest pain.  Patient has no recent fevers, has been feeling in her normal state of health otherwise.  Past Medical History  Diagnosis Date  . Hypertension   . High cholesterol   . GERD (gastroesophageal reflux disease)   . Arthritis   . Anxiety   . Depression   . Cataract   . Hearing reduced   . PONV (postoperative nausea and vomiting)   . H/O partial mastectomy   . Cancer     left breast   Past Surgical History  Procedure Laterality Date  . Total knee arthroplasty  2008    left, [facility]  . Elbow surgery      left  . Abdominal hysterectomy    . Knee arthroscopy      right  . Mastectomy, partial  1998    left  . Cataract extraction w/phaco  03/23/2012    Procedure: CATARACT EXTRACTION PHACO AND INTRAOCULAR LENS PLACEMENT (IOC);  Surgeon: Loraine Leriche T. Nile Riggs, MD;  Location: [facility];  Service: Ophthalmology;  Laterality: Right;  CDE:12.73  . Cataract extraction w/phaco  04/20/2012    Procedure: CATARACT EXTRACTION PHACO AND INTRAOCULAR LENS PLACEMENT (IOC);  Surgeon: Loraine Leriche T. Nile Riggs, MD;  Location: [facility];  Service: Ophthalmology;  Laterality: Left;  CDE:7.70  . Yag laser application Left  11/07/2014    Procedure: YAG LASER APPLICATION;  Surgeon: Loraine Leriche T. Nile Riggs, MD;  Location: [facility];  Service: Ophthalmology;  Laterality: Left;  left  . Yag laser application Right 11/21/2014    Procedure: YAG LASER APPLICATION;  Surgeon: Loraine Leriche T. Nile Riggs, MD;  Location: [facility];  Service: Ophthalmology;  Laterality: Right;  right   History reviewed. No pertinent family history. Social History  Substance Use Topics  . Smoking status: Never Smoker   . Smokeless tobacco: None  . Alcohol Use: No   OB History    No data available     Review of Systems  Constitutional: Negative for activity change and fatigue.  HENT: Negative for congestion and drooling.   Eyes: Negative for discharge.  Respiratory: Positive for shortness of breath. Negative for cough and chest tightness.   Cardiovascular: Negative for chest pain and palpitations.  Gastrointestinal: Negative for abdominal distention.  Genitourinary: Negative for dysuria and difficulty urinating.  Musculoskeletal: Negative for joint swelling.  Skin: Negative for rash.  Allergic/Immunologic: Negative for immunocompromised state.  Neurological: Negative for seizures and speech difficulty.  Psychiatric/Behavioral: Negative for behavioral problems and agitation.      Allergies  Review of patient's allergies indicates no known allergies.  Home Medications   Prior to Admission medications   Medication Sig Start Date End Date Taking? Authorizing Provider  ALPRAZolam Prudy Feeler) 0.5 MG tablet Take 0.5 mg  by mouth 3 (three) times daily as needed. For anxiety    Historical Provider, MD  amLODipine (NORVASC) 5 MG tablet Take 5 mg by mouth daily.    Historical Provider, MD  aspirin EC 81 MG tablet Take 81 mg by mouth daily.    Historical Provider, MD  atorvastatin (LIPITOR) 40 MG tablet Take 40 mg by mouth daily.    Historical Provider, MD  citalopram (CELEXA) 10 MG tablet Take 10 mg by mouth daily. 09/15/14   Historical Provider, MD  ferrous  sulfate 325 (65 FE) MG tablet Take 325 mg by mouth daily with breakfast.    Historical Provider, MD  HYDROcodone-acetaminophen (NORCO/VICODIN) 5-325 MG per tablet Take 1 tablet by mouth 2 (two) times daily as needed for moderate pain.  09/28/14   Historical Provider, MD  HYDROcodone-acetaminophen (VICODIN) 5-500 MG per tablet Take 1 tablet by mouth daily. For arthritis pain     Historical Provider, MD  levothyroxine (SYNTHROID, LEVOTHROID) 75 MCG tablet Take 75 mcg by mouth daily. 10/24/14   Historical Provider, MD  losartan-hydrochlorothiazide (HYZAAR) 50-12.5 MG per tablet Take 1 tablet by mouth daily.    Historical Provider, MD  metoprolol succinate (TOPROL-XL) 25 MG 24 hr tablet Take 25 mg by mouth daily. 10/24/14   Historical Provider, MD  omeprazole (PRILOSEC) 20 MG capsule Take 20 mg by mouth daily. 10/07/14   Historical Provider, MD   BP 156/93 mmHg  Pulse 108  Temp(Src) 97.7 F (36.5 C) (Oral)  Resp 20  Ht  (1.727 m)  Wt 240 lb (108.863 kg)  BMI 36.50 kg/m2  SpO2 100% Physical Exam  Constitutional: She is oriented to person, place, and time. She appears well-developed and well-nourished.  HENT:  Head: Normocephalic and atraumatic.  Eyes: Conjunctivae are normal. Right eye exhibits no discharge.  Neck: Neck supple.  Cardiovascular: Normal rate and normal heart sounds.   No murmur heard. Pulmonary/Chest: Effort normal and breath sounds normal. She has no wheezes. She has no rales.  Abdominal: Soft. She exhibits no distension. There is no tenderness.  Musculoskeletal: Normal range of motion. She exhibits edema.  Neurological: She is oriented to person, place, and time. No cranial nerve deficit.  Skin: Skin is warm and dry. No rash noted. She is not diaphoretic.  Psychiatric: She has a normal mood and affect. Her behavior is normal.  Nursing note and vitals reviewed.   ED Course  Procedures (including critical care time) Labs Review Labs Reviewed  BASIC METABOLIC PANEL   CBC WITH DIFFERENTIAL/PLATELET  D-DIMER, QUANTITATIVE (NOT AT [facility])  I-STAT TROPOININ, ED    Imaging Review No results found. I have personally reviewed and evaluated these images and lab results as part of my medical decision-making.   EKG Interpretation   Date/Time:  Friday July 13 2015 14:51:58 EDT Ventricular Rate:  105 PR Interval:    QRS Duration: 153 QT Interval:  377 QTC Calculation: 498 R Axis:   23 Text Interpretation:  Atrial flutter with predominant 3:1 AV block LVH  with secondary repolarization abnormality Borderline prolonged QT interval  LBBB neg scarbossa Atrial fibrillation Confirmed by Kandis Mannan  [phone]) on 07/13/2015 3:15:38 PM      MDM   Final diagnoses:  None    Patient is a 81 year old female presenting today with shortness of breath occasionally episodically for the last 1-3 weeks. He should seen by primary care office prior to arrival and had a heart rate measured in the 140s. On arrival here she's had a normal  heart rate. Patient is in A. fib. Since last visit here she is also in A. fib. Patient is on metoprolol home. I would not increase the dose of metoprolol this time. We'll get a chest x-ray to ensure he does not have a concomitant pneumonia. D-dimer is pending.  I think the interventions breath may be due to her going in and out of A. fib with RVR. However would not want to change her dose of metoprolol without consulting her primary care office.   If negative troponin and workup otherwise negative,will hve her foll;ow up with PCP.  3:36 PM According to recent no by Dr. Margo Aye, patient is in chronic A. fib and not a candidate for any more AV nodal blockers nor Coumadin due to recent falls. Therefore would not alter patient's medication rather have her follow up with primary care in the Monday. According to recent notes she's had recent episodes of short of breath which has been characterized as anxiety spells. Question whether in light  of the fact that she had the same thing today with an elevated heart rate that these were intermittent episodes of A. fib with RVR.  Alecxander Mainwaring Randall An, MD 07/13/15 1537

## 2015-07-13 NOTE — ED Notes (Signed)
Report called to [facility], on Dept 300. All questions answered.

## 2015-07-13 NOTE — ED Notes (Signed)
Pt HR remains 140's, Cardizem  given per order

## 2015-07-13 NOTE — H&P (Addendum)
History and Physical  Michaela Tyler WUJ:[id number] DOB: 1921-07-05 DOA: 07/13/2015  Referring physician: Dr Corlis Leak, ED physician PCP: Avon Gully, MD   Chief Complaint: Shortness of breath  HPI: Michaela Tyler is a 81 y.o. female  With a history of hypertension, chronic A. fib not anticoagulated, high cholesterol, arthritis, GERD. Patient presents to her PCPs office with shortness of breath and was found to have a heart rate in the 140s. The patient was directed here but was found to have a normal heart rate. The patient has had these episodes of shortness of breath intermittently and has had these episodes for quite some time. She is found that she's had increased number over the past several weeks. Being anxious or worried increases her episodes while rest decreases the episodes.  Review of Systems:   Pt denies any fevers, chills, nausea, vomiting, diarrhea, constipation, abdominal pain, dyspnea on exertion, orthopnea, cough, wheezing, palpitations, headache, vision changes, lightheadedness, dizziness, diarrhea, constipation, melena, rectal bleeding.  Review of systems are otherwise negative  Past Medical History  Diagnosis Date  . Hypertension   . High cholesterol   . GERD (gastroesophageal reflux disease)   . Arthritis   . Anxiety   . Depression   . Cataract   . Hearing reduced   . PONV (postoperative nausea and vomiting)   . H/O partial mastectomy   . Cancer     left breast   Past Surgical History  Procedure Laterality Date  . Total knee arthroplasty  2008    left, [facility]  . Elbow surgery      left  . Abdominal hysterectomy    . Knee arthroscopy      right  . Mastectomy, partial  1998    left  . Cataract extraction w/phaco  03/23/2012    Procedure: CATARACT EXTRACTION PHACO AND INTRAOCULAR LENS PLACEMENT (IOC);  Surgeon: Loraine Leriche T. Nile Riggs, MD;  Location: [facility];  Service: Ophthalmology;  Laterality: Right;  CDE:12.73  . Cataract extraction w/phaco  04/20/2012   Procedure: CATARACT EXTRACTION PHACO AND INTRAOCULAR LENS PLACEMENT (IOC);  Surgeon: Loraine Leriche T. Nile Riggs, MD;  Location: [facility];  Service: Ophthalmology;  Laterality: Left;  CDE:7.70  . Yag laser application Left 11/07/2014    Procedure: YAG LASER APPLICATION;  Surgeon: Loraine Leriche T. Nile Riggs, MD;  Location: [facility];  Service: Ophthalmology;  Laterality: Left;  left  . Yag laser application Right 11/21/2014    Procedure: YAG LASER APPLICATION;  Surgeon: Loraine Leriche T. Nile Riggs, MD;  Location: [facility];  Service: Ophthalmology;  Laterality: Right;  right   Social History:  reports that she has never smoked. She does not have any smokeless tobacco history on file. She reports that she does not drink alcohol or use illicit drugs. Patient lives at home & is able to participate in activities of daily living  No Known Allergies  Patient unaware of family history  Prior to Admission medications   Medication Sig Start Date End Date Taking? Authorizing Provider  ALPRAZolam Prudy Feeler) 0.5 MG tablet Take 0.5 mg by mouth 2 (two) times daily. For anxiety   Yes Historical Provider, MD  amLODipine (NORVASC) 5 MG tablet Take 5 mg by mouth daily.   Yes Historical Provider, MD  aspirin EC 81 MG tablet Take 81 mg by mouth daily.   Yes Historical Provider, MD  atorvastatin (LIPITOR) 40 MG tablet Take 40 mg by mouth daily.   Yes Historical Provider, MD  cetirizine (ZYRTEC) 10 MG tablet Take 10 mg by mouth daily.  Yes Historical Provider, MD  citalopram (CELEXA) 10 MG tablet Take 10 mg by mouth daily. 09/15/14  Yes Historical Provider, MD  ferrous sulfate 325 (65 FE) MG tablet Take 325 mg by mouth daily with breakfast.   Yes Historical Provider, MD  HYDROcodone-acetaminophen (NORCO/VICODIN) 5-325 MG per tablet Take 1 tablet by mouth 2 (two) times daily as needed for moderate pain.  09/28/14  Yes Historical Provider, MD  levothyroxine (SYNTHROID, LEVOTHROID) 88 MCG tablet Take 88 mcg by mouth daily. 07/09/15  Yes Historical Provider, MD    losartan-hydrochlorothiazide (HYZAAR) 50-12.5 MG per tablet Take 1 tablet by mouth daily.   Yes Historical Provider, MD  methocarbamol (ROBAXIN) 500 MG tablet Take 500 mg by mouth daily as needed for muscle spasms.   Yes Historical Provider, MD  metoprolol succinate (TOPROL-XL) 25 MG 24 hr tablet Take 25 mg by mouth daily. 10/24/14  Yes Historical Provider, MD  omeprazole (PRILOSEC) 20 MG capsule Take 20 mg by mouth daily. 10/07/14  Yes Historical Provider, MD    Physical Exam: BP 124/105 mmHg  Pulse 141  Temp(Src) 97.7 F (36.5 C) (Oral)  Resp 15  Ht  (1.727 m)  Wt 108.863 kg (240 lb)  BMI 36.50 kg/m2  SpO2 95%  General: Elderly black female. Awake and alert and oriented x3. No acute cardiopulmonary distress.  Eyes: Pupils equal, round, reactive to light. Extraocular muscles are intact. Sclerae anicteric and noninjected.  ENT:  Moist mucosal membranes. No mucosal lesions.  Neck: Neck supple without lymphadenopathy. No carotid bruits. No masses palpated.  Cardiovascular: Irregularly irregular rate with normal S1-S2 sounds. No murmurs, rubs, gallops auscultated. No JVD.  Respiratory: Good respiratory effort with no wheezes, rales, rhonchi. Lungs clear to auscultation bilaterally.  Abdomen: Soft, nontender, nondistended. Active bowel sounds. No masses or hepatosplenomegaly  Skin: Dry, warm to touch. 2+ dorsalis pedis and radial pulses. Musculoskeletal: No calf or leg pain. All major joints not erythematous nontender.  Psychiatric: Intact judgment and insight.  Neurologic: No focal neurological deficits. Cranial nerves II through XII are grossly intact.           Labs on Admission:  Basic Metabolic Panel:  Recent Labs Lab 07/13/15 1520  NA 134*  K 3.9  CL 106  CO2 22  GLUCOSE 106*  BUN 18  CREATININE 1.45*  CALCIUM 8.5*   Liver Function Tests: No results for input(s): AST, ALT, ALKPHOS, BILITOT, PROT, ALBUMIN in the last 168 hours. No results for input(s): LIPASE,  AMYLASE in the last 168 hours. No results for input(s): AMMONIA in the last 168 hours. CBC:  Recent Labs Lab 07/13/15 1520  WBC 5.2  NEUTROABS 3.9  HGB 10.2*  HCT 32.0*  MCV 91.2  PLT 180   Cardiac Enzymes: No results for input(s): CKTOTAL, CKMB, CKMBINDEX, TROPONINI in the last 168 hours.  BNP (last 3 results) No results for input(s): BNP in the last 8760 hours.  ProBNP (last 3 results) No results for input(s): PROBNP in the last 8760 hours.  CBG: No results for input(s): GLUCAP in the last 168 hours.  Radiological Exams on Admission: Dg Chest 2 View  07/13/2015   CLINICAL DATA:  Shortness of breath.  EXAM: CHEST  2 VIEW  COMPARISON:  April 13, 2009.  FINDINGS: Stable cardiomegaly and mild central pulmonary vascular congestion is noted. No pneumothorax is noted. Minimal bilateral pleural effusions are noted. Anterior osteophyte formation is noted in the mid thoracic spine. Possible mild bilateral perihilar edema is noted.  IMPRESSION: Stable mild  cardiomegaly. Mild central pulmonary vascular congestion and possible perihilar edema is noted.   Electronically Signed   By: Lupita Raider, M.D.   On: 07/13/2015 16:05   Ct Angio Chest Pe W/cm &/or Wo Cm  07/13/2015   CLINICAL DATA:  Shortness of breath for 10 days.  Elevated D-dimer.  EXAM: CT ANGIOGRAPHY CHEST WITH CONTRAST  TECHNIQUE: Multidetector CT imaging of the chest was performed using the standard protocol during bolus administration of intravenous contrast. Multiplanar CT image reconstructions and MIPs were obtained to evaluate the vascular anatomy.  CONTRAST:  OMNIPAQUE IOHEXOL 350 MG/ML SOLN  COMPARISON:  None.  FINDINGS: Chest wall: No chest wall mass, supraclavicular or axillary lymphadenopathy. The thyroid gland is grossly normal. The bony thorax is intact. No destructive bone lesions or spinal canal compromise. Moderate degenerative changes involving the thoracic spine.  Mediastinum: The heart is enlarged. Moderate  fluid in the pericardial recesses but no overt pericardial effusion. There is tortuosity, ectasia and calcification of the thoracic aorta but no obvious dissection. The maximum transverse diameter of the ascending aorta is 4.3 cm. Three-vessel coronary artery calcifications are noted the esophagus is grossly normal.  The pulmonary arterial tree is fairly well opacified. No filling defects to suggest pulmonary embolism. The pulmonary arteries are enlarged, consistent with pulmonary hypertension.  Lungs/ pleura: Patchy ground-glass opacity in the lungs is most likely edema. Other possibilities would include partial airspace filling process such as infection, inflammations/alveolitis. No worrisome pulmonary lesions. No pleural effusions. Moderate central vascular congestion.  Upper abdomen: Small left adrenal gland lesion is an indeterminate finding. It measures 28 Hounsfield units and could be a lipid poor adenoma. I do not think this needs further imaging evaluation given the patient's age.  Review of the MIP images confirms the above findings.  IMPRESSION: 1. No CT findings for pulmonary embolism. 2. Tortuosity and ectasia of the thoracic aorta. Recommend annual imaging followup by CTA or MRA. This recommendation follows 2010 ACCF/AHA/AATS/ACR/ASA/SCA/SCAI/SIR/STS/SVM Guidelines for the Diagnosis and Management of Patients with Thoracic Aortic Disease. Circulation. 2010; 121: Z610-R604 3. Patchy ground-glass opacity in the lungs likely reflecting mild edema or alveolitis. There is also central vascular congestion but no pleural effusion.   Electronically Signed   By: Rudie Meyer M.D.   On: 07/13/2015 17:58    EKG: Independently reviewed. Atrial fibrillation with RVR. Left bundle branch block  Assessment/Plan Present on Admission:  . Atrial fibrillation with RVR  This patient was discussed with the ED physician, including pertinent vitals, physical exam findings, labs, and imaging.  We also discussed care  given by the ED provider.  #1 atrial fibrillation with RVR  Currently rate controlled  Admit for observation to telemetry  Stop amlodipine and increase metoprolol XL to 50 daily - will need to be cautious with this medication given her left bundle branch block and cardiology's hesitation to prescribe AV nodal blocking agents, however these frequent and symptomatic episodes necessitating some rate control.  [place] vascular score of 4  Patient not a candidate for chronic anticoagulation. #2 hypertension  Change blood pressure medications as above #3 hypothyroidism  Will obtain TSH to ensure patient within parameters  DVT prophylaxis: Lovenox while in the hospital  Consultants: None  Code Status: Full code  Family Communication: None   Disposition Plan: Observation on telemetry   Levie Heritage, DO [facility] Pager [phone]

## 2015-07-13 NOTE — ED Notes (Signed)
Pt with c/o shortness of breath off and on for 1-2 weeks.

## 2015-07-13 NOTE — ED Notes (Signed)
After pulling IV (was told patient was going to be discharged) patient went into RVR with rate 140's for approximately 30-45 seconds. About 5 mins later patient went back into RVR rate 140's which Dr. Corlis Leak seen, pt remains in RVR at this time.

## 2015-07-14 DIAGNOSIS — I10 Essential (primary) hypertension: Secondary | ICD-10-CM | POA: Diagnosis not present

## 2015-07-14 DIAGNOSIS — I4891 Unspecified atrial fibrillation: Secondary | ICD-10-CM | POA: Diagnosis not present

## 2015-07-14 LAB — BASIC METABOLIC PANEL
ANION GAP: 7 (ref 5–15)
BUN: 18 mg/dL (ref 6–20)
CHLORIDE: 107 mmol/L (ref 101–111)
CO2: 22 mmol/L (ref 22–32)
Calcium: 8.5 mg/dL — ABNORMAL LOW (ref 8.9–10.3)
Creatinine, Ser: 1.42 mg/dL — ABNORMAL HIGH (ref 0.44–1.00)
GFR calc Af Amer: 36 mL/min — ABNORMAL LOW (ref >60)
GFR, EST NON AFRICAN AMERICAN: 31 mL/min — AB (ref >60)
GLUCOSE: 117 mg/dL — AB (ref 65–99)
POTASSIUM: 3.7 mmol/L (ref 3.5–5.1)
Sodium: 136 mmol/L (ref 135–145)

## 2015-07-14 MED ORDER — METOPROLOL TARTRATE 1 MG/ML IV SOLN
5.0000 mg | Freq: Once | INTRAVENOUS | Status: AC
  Administered 2015-07-14: 5 mg via INTRAVENOUS
  Filled 2015-07-14: qty 5

## 2015-07-14 MED ORDER — ENOXAPARIN SODIUM 30 MG/0.3ML ~~LOC~~ SOLN
30.0000 mg | SUBCUTANEOUS | Status: DC
  Administered 2015-07-14: 30 mg via SUBCUTANEOUS
  Filled 2015-07-14: qty 0.3

## 2015-07-14 NOTE — Progress Notes (Signed)
This morning patient's heart rate was sustaining in the 140's with increased blood pressure. Patient asymptomatic. MD notified. Order given for 5 mg of metoprolol IV. After receiving medication, HR is in 70's and BP is improved. Will continue to monitor.

## 2015-07-14 NOTE — Progress Notes (Signed)
Cardiology called for consult as ordered. Dr. Anne Fu stated Cardiology does not do Cardiology consults to [facility] on the weekends. Dr. Felecia Shelling paged with no return call. Consult re-entered into consult book.

## 2015-07-14 NOTE — Progress Notes (Signed)
Subjective: Patient was admitted due to shortness of breath secondary to atrial fibrillation with RVR. Patient is con Cardizem drip. She feels better today.  Objective: Vital signs in last 24 hours: Temp:  [97.7 F (36.5 C)-98.3 F (36.8 C)] 98.3 F (36.8 C) (09/17 0452) Pulse Rate:  [68-144] 77 (09/17 0537) Resp:  [15-29] 20 (09/17 0452) BP: (101-168)/(82-109) 130/82 mmHg (09/17 0537) SpO2:  [89 %-100 %] 96 % (09/17 0452) Weight:  [50.939 kg (112 lb 4.8 oz)-108.863 kg (240 lb)] 50.939 kg (112 lb 4.8 oz) (09/16 2230) Weight change:  Last BM Date: 07/13/15  Intake/Output from previous day:    PHYSICAL EXAM General appearance: alert and no distress Resp: clear to auscultation bilaterally Cardio: irregularly irregular rhythm GI: soft, non-tender; bowel sounds normal; no masses,  no organomegaly Extremities: extremities normal, atraumatic, no cyanosis or edema  Lab Results:  Results for orders placed or performed during the hospital encounter of 07/13/15 (from the past 48 hour(s))  Basic metabolic panel     Status: Abnormal   Collection Time: 07/13/15  3:20 PM  Result Value Ref Range   Sodium 134 (L) 135 - 145 mmol/L   Potassium 3.9 3.5 - 5.1 mmol/L   Chloride 106 101 - 111 mmol/L   CO2 22 22 - 32 mmol/L   Glucose, Bld 106 (H) 65 - 99 mg/dL   BUN 18 6 - 20 mg/dL   Creatinine, Ser 1.45 (H) 0.44 - 1.00 mg/dL   Calcium 8.5 (L) 8.9 - 10.3 mg/dL   GFR calc non Af Amer 30 (L) >60 mL/min   GFR calc Af Amer 35 (L) >60 mL/min    Comment: (NOTE) The eGFR has been calculated using the CKD EPI equation. This calculation has not been validated in all clinical situations. eGFR's persistently <60 mL/min signify possible Chronic Kidney Disease.    Anion gap 6 5 - 15  CBC with Differential/Platelet     Status: Abnormal   Collection Time: 07/13/15  3:20 PM  Result Value Ref Range   WBC 5.2 4.0 - 10.5 K/uL   RBC 3.51 (L) 3.87 - 5.11 MIL/uL   Hemoglobin 10.2 (L) 12.0 - 15.0 g/dL   HCT  32.0 (L) 36.0 - 46.0 %   MCV 91.2 78.0 - 100.0 fL   MCH 29.1 26.0 - 34.0 pg   MCHC 31.9 30.0 - 36.0 g/dL   RDW 14.7 11.5 - 15.5 %   Platelets 180 150 - 400 K/uL   Neutrophils Relative % 74 %   Neutro Abs 3.9 1.7 - 7.7 K/uL   Lymphocytes Relative 16 %   Lymphs Abs 0.8 0.7 - 4.0 K/uL   Monocytes Relative 7 %   Monocytes Absolute 0.3 0.1 - 1.0 K/uL   Eosinophils Relative 3 %   Eosinophils Absolute 0.1 0.0 - 0.7 K/uL   Basophils Relative 0 %   Basophils Absolute 0.0 0.0 - 0.1 K/uL  D-dimer, quantitative (not at [facility])     Status: Abnormal   Collection Time: 07/13/15  3:20 PM  Result Value Ref Range   D-Dimer, Quant 12.85 (H) 0.00 - 0.48 ug/mL-FEU    Comment:        AT THE INHOUSE ESTABLISHED CUTOFF VALUE OF 0.48 ug/mL FEU, THIS ASSAY HAS BEEN DOCUMENTED IN THE LITERATURE TO HAVE A SENSITIVITY AND NEGATIVE PREDICTIVE VALUE OF AT LEAST 98 TO 99%.  THE TEST RESULT SHOULD BE CORRELATED WITH AN ASSESSMENT OF THE CLINICAL PROBABILITY OF DVT / VTE.   TSH  Status: None   Collection Time: 07/13/15  3:20 PM  Result Value Ref Range   TSH 2.136 0.350 - 4.500 uIU/mL  I-stat troponin, ED     Status: None   Collection Time: 07/13/15  3:22 PM  Result Value Ref Range   Troponin i, poc 0.01 0.00 - 0.08 ng/mL   Comment 3            Comment: Due to the release kinetics of cTnI, a negative result within the first hours of the onset of symptoms does not rule out myocardial infarction with certainty. If myocardial infarction is still suspected, repeat the test at appropriate intervals.   I-Stat Troponin, ED (not at [facility])     Status: None   Collection Time: 07/13/15  6:41 PM  Result Value Ref Range   Troponin i, poc 0.02 0.00 - 0.08 ng/mL   Comment 3            Comment: Due to the release kinetics of cTnI, a negative result within the first hours of the onset of symptoms does not rule out myocardial infarction with certainty. If myocardial infarction is still suspected, repeat the test  at appropriate intervals.   Basic metabolic panel     Status: Abnormal   Collection Time: 07/14/15  5:40 AM  Result Value Ref Range   Sodium 136 135 - 145 mmol/L   Potassium 3.7 3.5 - 5.1 mmol/L   Chloride 107 101 - 111 mmol/L   CO2 22 22 - 32 mmol/L   Glucose, Bld 117 (H) 65 - 99 mg/dL   BUN 18 6 - 20 mg/dL   Creatinine, Ser 1.42 (H) 0.44 - 1.00 mg/dL   Calcium 8.5 (L) 8.9 - 10.3 mg/dL   GFR calc non Af Amer 31 (L) >60 mL/min   GFR calc Af Amer 36 (L) >60 mL/min    Comment: (NOTE) The eGFR has been calculated using the CKD EPI equation. This calculation has not been validated in all clinical situations. eGFR's persistently <60 mL/min signify possible Chronic Kidney Disease.    Anion gap 7 5 - 15    ABGS No results for input(s): PHART, PO2ART, TCO2, HCO3 in the last 72 hours.  Invalid input(s): PCO2 CULTURES No results found for this or any previous visit (from the past 240 hour(s)). Studies/Results: Dg Chest 2 View  07/13/2015   CLINICAL DATA:  Shortness of breath.  EXAM: CHEST  2 VIEW  COMPARISON:  April 13, 2009.  FINDINGS: Stable cardiomegaly and mild central pulmonary vascular congestion is noted. No pneumothorax is noted. Minimal bilateral pleural effusions are noted. Anterior osteophyte formation is noted in the mid thoracic spine. Possible mild bilateral perihilar edema is noted.  IMPRESSION: Stable mild cardiomegaly. Mild central pulmonary vascular congestion and possible perihilar edema is noted.   Electronically Signed   By: Marijo Conception, M.D.   On: 07/13/2015 16:05   Ct Angio Chest Pe W/cm &/or Wo Cm  07/13/2015   CLINICAL DATA:  Shortness of breath for 10 days.  Elevated D-dimer.  EXAM: CT ANGIOGRAPHY CHEST WITH CONTRAST  TECHNIQUE: Multidetector CT imaging of the chest was performed using the standard protocol during bolus administration of intravenous contrast. Multiplanar CT image reconstructions and MIPs were obtained to evaluate the vascular anatomy.  CONTRAST:   166m OMNIPAQUE IOHEXOL 350 MG/ML SOLN  COMPARISON:  None.  FINDINGS: Chest wall: No chest wall mass, supraclavicular or axillary lymphadenopathy. The thyroid gland is grossly normal. The bony thorax is intact. No  destructive bone lesions or spinal canal compromise. Moderate degenerative changes involving the thoracic spine.  Mediastinum: The heart is enlarged. Moderate fluid in the pericardial recesses but no overt pericardial effusion. There is tortuosity, ectasia and calcification of the thoracic aorta but no obvious dissection. The maximum transverse diameter of the ascending aorta is 4.3 cm. Three-vessel coronary artery calcifications are noted the esophagus is grossly normal.  The pulmonary arterial tree is fairly well opacified. No filling defects to suggest pulmonary embolism. The pulmonary arteries are enlarged, consistent with pulmonary hypertension.  Lungs/ pleura: Patchy ground-glass opacity in the lungs is most likely edema. Other possibilities would include partial airspace filling process such as infection, inflammations/alveolitis. No worrisome pulmonary lesions. No pleural effusions. Moderate central vascular congestion.  Upper abdomen: Small left adrenal gland lesion is an indeterminate finding. It measures 28 Hounsfield units and could be a lipid poor adenoma. I do not think this needs further imaging evaluation given the patient's age.  Review of the MIP images confirms the above findings.  IMPRESSION: 1. No CT findings for pulmonary embolism. 2. Tortuosity and ectasia of the thoracic aorta. Recommend annual imaging followup by CTA or MRA. This recommendation follows 2010 ACCF/AHA/AATS/ACR/ASA/SCA/SCAI/SIR/STS/SVM Guidelines for the Diagnosis and Management of Patients with Thoracic Aortic Disease. Circulation. 2010; 121: H704-Y925 3. Patchy ground-glass opacity in the lungs likely reflecting mild edema or alveolitis. There is also central vascular congestion but no pleural effusion.    Electronically Signed   By: Marijo Sanes M.D.   On: 07/13/2015 17:58    Medications: I have reviewed the patient's current medications.  Assesment:   Active Problems:   Atrial fibrillation with RVR    Plan:  Medications reviewed Continue cardizem drip Will follow TSH result Echo Cardiology consult      Camala Talwar 07/14/2015, 11:03 AM

## 2015-07-15 ENCOUNTER — Observation Stay (HOSPITAL_BASED_OUTPATIENT_CLINIC_OR_DEPARTMENT_OTHER): Payer: Medicare Other

## 2015-07-15 DIAGNOSIS — I10 Essential (primary) hypertension: Secondary | ICD-10-CM | POA: Diagnosis not present

## 2015-07-15 DIAGNOSIS — I4891 Unspecified atrial fibrillation: Secondary | ICD-10-CM | POA: Diagnosis not present

## 2015-07-15 MED ORDER — METOPROLOL SUCCINATE ER 50 MG PO TB24: 50.0000 mg | ORAL_TABLET | Freq: Every day | ORAL | Status: DC

## 2015-07-15 NOTE — Progress Notes (Signed)
Pt. discharged home today. Pt. Received discharge instructions, prescriptions and care notes. Pt. Verbalized understanding and has no questions or concerns at this time. Pt.'s IV removed with catheter intact,no bleeding or complications. Heart monitor removed and central tele notified. Pt. Left unit in stable condition in wheelchair with staff member. 

## 2015-07-15 NOTE — Discharge Summary (Signed)
Physician Discharge Summary  Patient ID: Michaela Tyler MRN: [id number] DOB/AGE: 79-May-1922 79 y.o. Primary Care Physician:FANTA,TESFAYE, MD Admit date: 07/13/2015 Discharge date: 07/15/2015    Discharge Diagnoses:   Active Problems:   Atrial fibrillation with RVR     Medication List    TAKE these medications        ALPRAZolam 0.5 MG tablet  Commonly known as:  XANAX  Take 0.5 mg by mouth 2 (two) times daily. For anxiety     amLODipine 5 MG tablet  Commonly known as:  NORVASC  Take 5 mg by mouth daily.     aspirin EC 81 MG tablet  Take 81 mg by mouth daily.     atorvastatin 40 MG tablet  Commonly known as:  LIPITOR  Take 40 mg by mouth daily.     cetirizine 10 MG tablet  Commonly known as:  ZYRTEC  Take 10 mg by mouth daily.     citalopram 10 MG tablet  Commonly known as:  CELEXA  Take 10 mg by mouth daily.     ferrous sulfate 325 (65 FE) MG tablet  Take 325 mg by mouth daily with breakfast.     HYDROcodone-acetaminophen 5-325 MG per tablet  Commonly known as:  NORCO/VICODIN  Take 1 tablet by mouth 2 (two) times daily as needed for moderate pain.     levothyroxine 88 MCG tablet  Commonly known as:  SYNTHROID, LEVOTHROID  Take 88 mcg by mouth daily.     losartan-hydrochlorothiazide 50-12.5 MG per tablet  Commonly known as:  HYZAAR  Take 1 tablet by mouth daily.     methocarbamol 500 MG tablet  Commonly known as:  ROBAXIN  Take 500 mg by mouth daily as needed for muscle spasms.     metoprolol succinate 50 MG 24 hr tablet  Commonly known as:  TOPROL-XL  Take 1 tablet (50 mg total) by mouth daily.     omeprazole 20 MG capsule  Commonly known as:  PRILOSEC  Take 20 mg by mouth daily.        Discharged Condition: improved    Consults: cardiology will consult in out patient  Significant Diagnostic Studies: Dg Chest 2 View  07/13/2015   CLINICAL DATA:  Shortness of breath.  EXAM: CHEST  2 VIEW  COMPARISON:  April 13, 2009.  FINDINGS: Stable  cardiomegaly and mild central pulmonary vascular congestion is noted. No pneumothorax is noted. Minimal bilateral pleural effusions are noted. Anterior osteophyte formation is noted in the mid thoracic spine. Possible mild bilateral perihilar edema is noted.  IMPRESSION: Stable mild cardiomegaly. Mild central pulmonary vascular congestion and possible perihilar edema is noted.   Electronically Signed   By: Lupita Raider, M.D.   On: 07/13/2015 16:05   Ct Angio Chest Pe W/cm &/or Wo Cm  07/13/2015   CLINICAL DATA:  Shortness of breath for 10 days.  Elevated D-dimer.  EXAM: CT ANGIOGRAPHY CHEST WITH CONTRAST  TECHNIQUE: Multidetector CT imaging of the chest was performed using the standard protocol during bolus administration of intravenous contrast. Multiplanar CT image reconstructions and MIPs were obtained to evaluate the vascular anatomy.  CONTRAST:  OMNIPAQUE IOHEXOL 350 MG/ML SOLN  COMPARISON:  None.  FINDINGS: Chest wall: No chest wall mass, supraclavicular or axillary lymphadenopathy. The thyroid gland is grossly normal. The bony thorax is intact. No destructive bone lesions or spinal canal compromise. Moderate degenerative changes involving the thoracic spine.  Mediastinum: The heart is enlarged. Moderate fluid in the pericardial  recesses but no overt pericardial effusion. There is tortuosity, ectasia and calcification of the thoracic aorta but no obvious dissection. The maximum transverse diameter of the ascending aorta is 4.3 cm. Three-vessel coronary artery calcifications are noted the esophagus is grossly normal.  The pulmonary arterial tree is fairly well opacified. No filling defects to suggest pulmonary embolism. The pulmonary arteries are enlarged, consistent with pulmonary hypertension.  Lungs/ pleura: Patchy ground-glass opacity in the lungs is most likely edema. Other possibilities would include partial airspace filling process such as infection, inflammations/alveolitis. No worrisome  pulmonary lesions. No pleural effusions. Moderate central vascular congestion.  Upper abdomen: Small left adrenal gland lesion is an indeterminate finding. It measures 28 Hounsfield units and could be a lipid poor adenoma. I do not think this needs further imaging evaluation given the patient's age.  Review of the MIP images confirms the above findings.  IMPRESSION: 1. No CT findings for pulmonary embolism. 2. Tortuosity and ectasia of the thoracic aorta. Recommend annual imaging followup by CTA or MRA. This recommendation follows 2010 ACCF/AHA/AATS/ACR/ASA/SCA/SCAI/SIR/STS/SVM Guidelines for the Diagnosis and Management of Patients with Thoracic Aortic Disease. Circulation. 2010; 121: E454-U981 3. Patchy ground-glass opacity in the lungs likely reflecting mild edema or alveolitis. There is also central vascular congestion but no pleural effusion.   Electronically Signed   By: Rudie Meyer M.D.   On: 07/13/2015 17:58    Lab Results: Basic Metabolic Panel:  Recent Labs  19/14/78 1520 07/14/15 0540  NA 134* 136  K 3.9 3.7  CL 106 107  CO2 22 22  GLUCOSE 106* 117*  BUN 18 18  CREATININE 1.45* 1.42*  CALCIUM 8.5* 8.5*   Liver Function Tests: No results for input(s): AST, ALT, ALKPHOS, BILITOT, PROT, ALBUMIN in the last 72 hours.   CBC:  Recent Labs  07/13/15 1520  WBC 5.2  NEUTROABS 3.9  HGB 10.2*  HCT 32.0*  MCV 91.2  PLT 180    No results found for this or any previous visit (from the past 240 hour(s)).   Hospital Course:   This is a 81 years old female with history of multiple medical illnesses was admitted due to atrial fibrillation with RVR. Patient received one dose bolus cardiazem and her rate was controlled. She was admitted under telemetry and was monitored. Her rate remained controlled. Echo was done. Her metoprolol was increased to 50 mg daily. Patient will be referred to cardiology in out patient.  Discharge Exam: Blood pressure 147/91, pulse 92, temperature 98 F  (36.7 C), temperature source Oral, resp. rate 20, height  (1.778 m), weight 50.939 kg (112 lb 4.8 oz), SpO2 97 %.   Disposition:  Home.        Follow-up Information    Follow up with [facility], MD. Schedule an appointment as soon as possible for a visit in 3 days.   Specialty:  Internal Medicine   Contact information:   [address] [place] [address] [phone]       Follow up with [facility], MD In 1 week.   Specialty:  Internal Medicine   Contact information:   [address] [place] [address] [phone]       Signed: Avon Gully   07/15/2015, 12:42 PM

## 2015-07-17 DIAGNOSIS — E039 Hypothyroidism, unspecified: Secondary | ICD-10-CM | POA: Diagnosis not present

## 2015-07-17 DIAGNOSIS — I4891 Unspecified atrial fibrillation: Secondary | ICD-10-CM | POA: Diagnosis not present

## 2015-07-17 DIAGNOSIS — I10 Essential (primary) hypertension: Secondary | ICD-10-CM | POA: Diagnosis not present

## 2015-09-03 DIAGNOSIS — M160 Bilateral primary osteoarthritis of hip: Secondary | ICD-10-CM | POA: Diagnosis not present

## 2015-09-03 DIAGNOSIS — E039 Hypothyroidism, unspecified: Secondary | ICD-10-CM | POA: Diagnosis not present

## 2015-09-03 DIAGNOSIS — I10 Essential (primary) hypertension: Secondary | ICD-10-CM | POA: Diagnosis not present

## 2015-10-04 IMAGING — CT CT ANGIO CHEST
2 of 6 series · 6 of 36 positions shown · IV contrast (Omnipaque 300)
Comparison: None.

CLINICAL DATA: Shortness of breath for 10 days.  Elevated D-dimer.

EXAM:
CT ANGIOGRAPHY CHEST WITH CONTRAST
TECHNIQUE: Multidetector CT imaging of the chest was performed using the
standard protocol during bolus administration of intravenous
contrast. Multiplanar CT image reconstructions and MIPs were
obtained to evaluate the vascular anatomy.
CONTRAST:  100mL OMNIPAQUE IOHEXOL 350 MG/ML SOLN

[Series 5: pe 3.0 b40f · axial · 0.59mm/px · z∈[-274,-72]mm · 5 of 101 slices shown]
[im 17/101  lung]
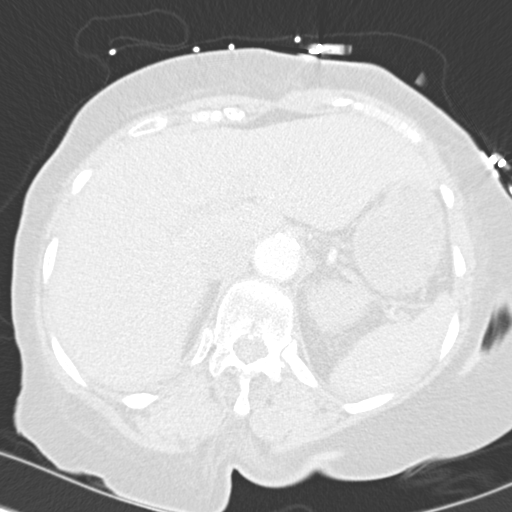
[im 34/101  mediastinal]
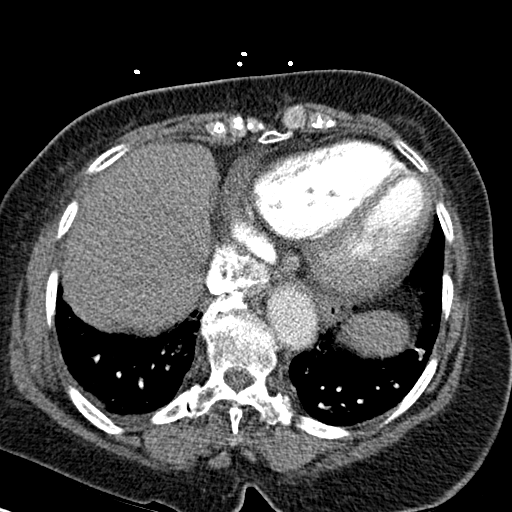
[im 51/101  lung]
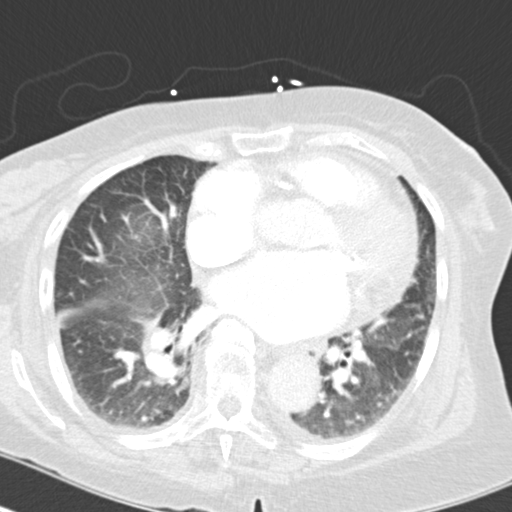
[im 67/101  mediastinal]
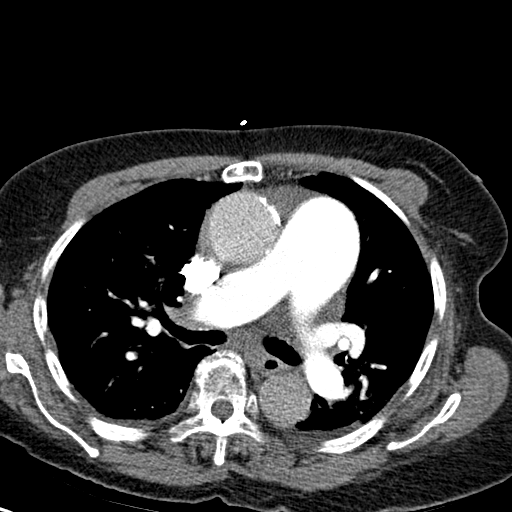
[im 84/101  lung]
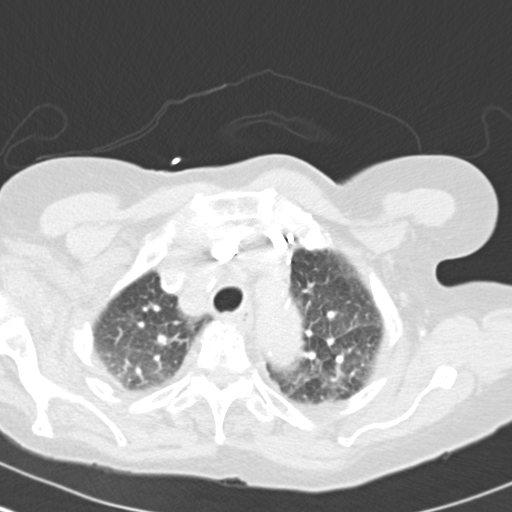

[Series 7: mpr coronal pe 3mm · coronal · 0.56mm/px · 1 of 78 slices shown]
[im 39/78  mediastinal]
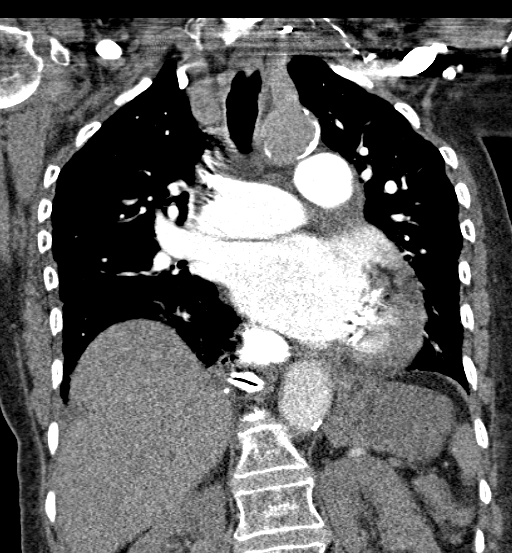

[6 of 36 positions shown; findings below may reference images not displayed]

FINDINGS: Chest wall: No chest wall mass, supraclavicular or axillary
lymphadenopathy. The thyroid gland is grossly normal. The bony
thorax is intact. No destructive bone lesions or spinal canal
compromise. Moderate degenerative changes involving the thoracic
spine.

Mediastinum: The heart is enlarged. Moderate fluid in the
pericardial recesses but no overt pericardial effusion. There is
tortuosity, ectasia and calcification of the thoracic aorta but no
obvious dissection. The maximum transverse diameter of the ascending
aorta is 4.3 cm. Three-vessel coronary artery calcifications are
noted the esophagus is grossly normal.

The pulmonary arterial tree is fairly well opacified. No filling
defects to suggest pulmonary embolism. The pulmonary arteries are
enlarged, consistent with pulmonary hypertension.

Lungs/ pleura: Patchy ground-glass opacity in the lungs is most
likely edema. Other possibilities would include partial airspace
filling process such as infection, inflammations/alveolitis. No
worrisome pulmonary lesions. No pleural effusions. Moderate central
vascular congestion.

Upper abdomen: Small left adrenal gland lesion is an indeterminate
finding. It measures 28 Hounsfield units and could be a lipid poor
adenoma. I do not think this needs further imaging evaluation given
the patient's age.

Review of the MIP images confirms the above findings.
IMPRESSION: 1. No CT findings for pulmonary embolism.
2. Tortuosity and ectasia of the thoracic aorta. Recommend annual
imaging followup by CTA or MRA. This recommendation follows 3262
ACCF/AHA/AATS/ACR/ASA/SCA/HARDISON/SALERS/DON LOLITO/KELECHI Guidelines for the
Diagnosis and Management of Patients with Thoracic Aortic Disease.
Circulation. 3262; 121: e266-e369
3. Patchy ground-glass opacity in the lungs likely reflecting mild
edema or alveolitis. There is also central vascular congestion but
no pleural effusion.

## 2015-10-09 DIAGNOSIS — M170 Bilateral primary osteoarthritis of knee: Secondary | ICD-10-CM | POA: Diagnosis not present

## 2015-10-09 DIAGNOSIS — F330 Major depressive disorder, recurrent, mild: Secondary | ICD-10-CM | POA: Diagnosis not present

## 2015-10-09 DIAGNOSIS — I10 Essential (primary) hypertension: Secondary | ICD-10-CM | POA: Diagnosis not present

## 2015-10-18 DIAGNOSIS — I10 Essential (primary) hypertension: Secondary | ICD-10-CM | POA: Diagnosis not present

## 2015-10-18 DIAGNOSIS — J00 Acute nasopharyngitis [common cold]: Secondary | ICD-10-CM | POA: Diagnosis not present

## 2015-11-27 DIAGNOSIS — M170 Bilateral primary osteoarthritis of knee: Secondary | ICD-10-CM | POA: Diagnosis not present

## 2015-11-27 DIAGNOSIS — I10 Essential (primary) hypertension: Secondary | ICD-10-CM | POA: Diagnosis not present

## 2015-11-27 DIAGNOSIS — E039 Hypothyroidism, unspecified: Secondary | ICD-10-CM | POA: Diagnosis not present

## 2015-12-20 DIAGNOSIS — R000 Tachycardia, unspecified: Secondary | ICD-10-CM | POA: Diagnosis not present

## 2015-12-20 DIAGNOSIS — K219 Gastro-esophageal reflux disease without esophagitis: Secondary | ICD-10-CM | POA: Diagnosis not present

## 2015-12-20 DIAGNOSIS — I10 Essential (primary) hypertension: Secondary | ICD-10-CM | POA: Diagnosis not present

## 2015-12-20 DIAGNOSIS — N182 Chronic kidney disease, stage 2 (mild): Secondary | ICD-10-CM | POA: Diagnosis not present

## 2015-12-20 DIAGNOSIS — R112 Nausea with vomiting, unspecified: Secondary | ICD-10-CM | POA: Diagnosis not present

## 2015-12-28 ENCOUNTER — Other Ambulatory Visit (HOSPITAL_COMMUNITY): Payer: Self-pay | Admitting: Internal Medicine

## 2015-12-28 ENCOUNTER — Ambulatory Visit (HOSPITAL_COMMUNITY)
Admission: RE | Admit: 2015-12-28 | Discharge: 2015-12-28 | Disposition: A | Discharge status: Home / Self Care | Payer: Medicare Other | Source: Ambulatory Visit | Attending: Internal Medicine | Admitting: Internal Medicine

## 2015-12-28 DIAGNOSIS — M25551 Pain in right hip: Secondary | ICD-10-CM

## 2015-12-28 DIAGNOSIS — M25552 Pain in left hip: Secondary | ICD-10-CM | POA: Diagnosis not present

## 2015-12-28 DIAGNOSIS — R600 Localized edema: Secondary | ICD-10-CM | POA: Diagnosis not present

## 2015-12-28 DIAGNOSIS — M1611 Unilateral primary osteoarthritis, right hip: Secondary | ICD-10-CM | POA: Diagnosis not present

## 2015-12-30 ENCOUNTER — Emergency Department (HOSPITAL_COMMUNITY): Payer: Medicare Other

## 2015-12-30 ENCOUNTER — Encounter (HOSPITAL_COMMUNITY): Payer: Self-pay

## 2015-12-30 ENCOUNTER — Inpatient Hospital Stay (HOSPITAL_COMMUNITY)
Admission: EM | Admit: 2015-12-30 | Discharge: 2016-01-08 | DRG: 308 | Disposition: A | Discharge status: Skilled Nursing Facility | Payer: Medicare Other | Attending: Internal Medicine | Admitting: Internal Medicine

## 2015-12-30 DIAGNOSIS — G8929 Other chronic pain: Secondary | ICD-10-CM | POA: Diagnosis not present

## 2015-12-30 DIAGNOSIS — I482 Chronic atrial fibrillation: Secondary | ICD-10-CM | POA: Diagnosis not present

## 2015-12-30 DIAGNOSIS — I447 Left bundle-branch block, unspecified: Secondary | ICD-10-CM | POA: Diagnosis not present

## 2015-12-30 DIAGNOSIS — M1990 Unspecified osteoarthritis, unspecified site: Secondary | ICD-10-CM | POA: Diagnosis present

## 2015-12-30 DIAGNOSIS — I5033 Acute on chronic diastolic (congestive) heart failure: Secondary | ICD-10-CM | POA: Diagnosis not present

## 2015-12-30 DIAGNOSIS — I5022 Chronic systolic (congestive) heart failure: Secondary | ICD-10-CM | POA: Diagnosis not present

## 2015-12-30 DIAGNOSIS — Z9012 Acquired absence of left breast and nipple: Secondary | ICD-10-CM

## 2015-12-30 DIAGNOSIS — I5043 Acute on chronic combined systolic (congestive) and diastolic (congestive) heart failure: Secondary | ICD-10-CM | POA: Diagnosis present

## 2015-12-30 DIAGNOSIS — I959 Hypotension, unspecified: Secondary | ICD-10-CM | POA: Diagnosis present

## 2015-12-30 DIAGNOSIS — K219 Gastro-esophageal reflux disease without esophagitis: Secondary | ICD-10-CM | POA: Diagnosis not present

## 2015-12-30 DIAGNOSIS — R0602 Shortness of breath: Secondary | ICD-10-CM

## 2015-12-30 DIAGNOSIS — E876 Hypokalemia: Secondary | ICD-10-CM | POA: Diagnosis not present

## 2015-12-30 DIAGNOSIS — I480 Paroxysmal atrial fibrillation: Secondary | ICD-10-CM

## 2015-12-30 DIAGNOSIS — Z853 Personal history of malignant neoplasm of breast: Secondary | ICD-10-CM | POA: Diagnosis not present

## 2015-12-30 DIAGNOSIS — Z90710 Acquired absence of both cervix and uterus: Secondary | ICD-10-CM

## 2015-12-30 DIAGNOSIS — M25551 Pain in right hip: Secondary | ICD-10-CM | POA: Diagnosis present

## 2015-12-30 DIAGNOSIS — Z7982 Long term (current) use of aspirin: Secondary | ICD-10-CM

## 2015-12-30 DIAGNOSIS — N179 Acute kidney failure, unspecified: Secondary | ICD-10-CM | POA: Diagnosis not present

## 2015-12-30 DIAGNOSIS — I4891 Unspecified atrial fibrillation: Principal | ICD-10-CM | POA: Diagnosis present

## 2015-12-30 DIAGNOSIS — E669 Obesity, unspecified: Secondary | ICD-10-CM | POA: Diagnosis present

## 2015-12-30 DIAGNOSIS — E039 Hypothyroidism, unspecified: Secondary | ICD-10-CM | POA: Diagnosis present

## 2015-12-30 DIAGNOSIS — I130 Hypertensive heart and chronic kidney disease with heart failure and stage 1 through stage 4 chronic kidney disease, or unspecified chronic kidney disease: Secondary | ICD-10-CM | POA: Diagnosis present

## 2015-12-30 DIAGNOSIS — Z96652 Presence of left artificial knee joint: Secondary | ICD-10-CM | POA: Diagnosis not present

## 2015-12-30 DIAGNOSIS — I9589 Other hypotension: Secondary | ICD-10-CM | POA: Diagnosis not present

## 2015-12-30 DIAGNOSIS — I481 Persistent atrial fibrillation: Secondary | ICD-10-CM | POA: Diagnosis not present

## 2015-12-30 DIAGNOSIS — Z79899 Other long term (current) drug therapy: Secondary | ICD-10-CM | POA: Diagnosis not present

## 2015-12-30 DIAGNOSIS — Z6837 Body mass index (BMI) 37.0-37.9, adult: Secondary | ICD-10-CM

## 2015-12-30 DIAGNOSIS — M7989 Other specified soft tissue disorders: Secondary | ICD-10-CM | POA: Diagnosis not present

## 2015-12-30 DIAGNOSIS — E7800 Pure hypercholesterolemia, unspecified: Secondary | ICD-10-CM | POA: Diagnosis present

## 2015-12-30 DIAGNOSIS — I5023 Acute on chronic systolic (congestive) heart failure: Secondary | ICD-10-CM | POA: Insufficient documentation

## 2015-12-30 DIAGNOSIS — R296 Repeated falls: Secondary | ICD-10-CM | POA: Diagnosis present

## 2015-12-30 DIAGNOSIS — N189 Chronic kidney disease, unspecified: Secondary | ICD-10-CM | POA: Diagnosis not present

## 2015-12-30 DIAGNOSIS — R7989 Other specified abnormal findings of blood chemistry: Secondary | ICD-10-CM

## 2015-12-30 DIAGNOSIS — R945 Abnormal results of liver function studies: Secondary | ICD-10-CM

## 2015-12-30 DIAGNOSIS — J90 Pleural effusion, not elsewhere classified: Secondary | ICD-10-CM

## 2015-12-30 DIAGNOSIS — N178 Other acute kidney failure: Secondary | ICD-10-CM | POA: Diagnosis not present

## 2015-12-30 DIAGNOSIS — R778 Other specified abnormalities of plasma proteins: Secondary | ICD-10-CM | POA: Insufficient documentation

## 2015-12-30 DIAGNOSIS — J811 Chronic pulmonary edema: Secondary | ICD-10-CM | POA: Diagnosis not present

## 2015-12-30 HISTORY — DX: Other chronic pain: G89.29

## 2015-12-30 HISTORY — DX: Low back pain, unspecified: M54.50

## 2015-12-30 HISTORY — DX: Low back pain: M54.5

## 2015-12-30 HISTORY — DX: Other specified postprocedural states: Z98.890

## 2015-12-30 HISTORY — DX: Pain in right hip: M25.551

## 2015-12-30 LAB — CBC WITH DIFFERENTIAL/PLATELET
BASOS ABS: 0 10*3/uL (ref 0.0–0.1)
BASOS PCT: 1 %
Eosinophils Absolute: 0.1 10*3/uL (ref 0.0–0.7)
Eosinophils Relative: 1 %
HEMATOCRIT: 38.3 % (ref 36.0–46.0)
HEMOGLOBIN: 12.4 g/dL (ref 12.0–15.0)
LYMPHS PCT: 19 %
Lymphs Abs: 1.1 10*3/uL (ref 0.7–4.0)
MCH: 30.5 pg (ref 26.0–34.0)
MCHC: 32.4 g/dL (ref 30.0–36.0)
MCV: 94.3 fL (ref 78.0–100.0)
Monocytes Absolute: 0.6 10*3/uL (ref 0.1–1.0)
Monocytes Relative: 10 %
NEUTROS ABS: 3.9 10*3/uL (ref 1.7–7.7)
NEUTROS PCT: 69 %
Platelets: 154 10*3/uL (ref 150–400)
RBC: 4.06 MIL/uL (ref 3.87–5.11)
RDW: 17.3 % — AB (ref 11.5–15.5)
WBC: 5.7 10*3/uL (ref 4.0–10.5)

## 2015-12-30 LAB — BASIC METABOLIC PANEL
Anion gap: 14 (ref 5–15)
BUN: 36 mg/dL — ABNORMAL HIGH (ref 6–20)
CO2: 21 mmol/L — ABNORMAL LOW (ref 22–32)
CREATININE: 2.39 mg/dL — AB (ref 0.44–1.00)
Calcium: 8.5 mg/dL — ABNORMAL LOW (ref 8.9–10.3)
Chloride: 101 mmol/L (ref 101–111)
GFR calc non Af Amer: 17 mL/min — ABNORMAL LOW (ref >60)
GFR, EST AFRICAN AMERICAN: 19 mL/min — AB (ref >60)
Glucose, Bld: 104 mg/dL — ABNORMAL HIGH (ref 65–99)
POTASSIUM: 3.9 mmol/L (ref 3.5–5.1)
SODIUM: 136 mmol/L (ref 135–145)

## 2015-12-30 LAB — BRAIN NATRIURETIC PEPTIDE: B NATRIURETIC PEPTIDE 5: 855 pg/mL — AB (ref 0.0–100.0)

## 2015-12-30 LAB — TROPONIN I: TROPONIN I: 0.04 ng/mL — AB (ref <0.031)

## 2015-12-30 MED ORDER — PANTOPRAZOLE SODIUM 40 MG PO TBEC
40.0000 mg | DELAYED_RELEASE_TABLET | Freq: Every day | ORAL | Status: DC
  Administered 2015-12-31 – 2016-01-08 (×9): 40 mg via ORAL
  Filled 2015-12-30 (×9): qty 1

## 2015-12-30 MED ORDER — LORATADINE 10 MG PO TABS
10.0000 mg | ORAL_TABLET | Freq: Every day | ORAL | Status: DC
  Administered 2015-12-31 – 2016-01-08 (×9): 10 mg via ORAL
  Filled 2015-12-30 (×9): qty 1

## 2015-12-30 MED ORDER — ENOXAPARIN SODIUM 30 MG/0.3ML ~~LOC~~ SOLN
30.0000 mg | SUBCUTANEOUS | Status: DC
  Administered 2015-12-30 – 2016-01-06 (×8): 30 mg via SUBCUTANEOUS
  Filled 2015-12-30 (×8): qty 0.3

## 2015-12-30 MED ORDER — CITALOPRAM HYDROBROMIDE 20 MG PO TABS
10.0000 mg | ORAL_TABLET | Freq: Every day | ORAL | Status: DC
  Administered 2015-12-30 – 2016-01-07 (×9): 10 mg via ORAL
  Filled 2015-12-30 (×9): qty 1

## 2015-12-30 MED ORDER — DILTIAZEM LOAD VIA INFUSION
10.0000 mg | Freq: Once | INTRAVENOUS | Status: AC
  Administered 2015-12-30: 10 mg via INTRAVENOUS
  Filled 2015-12-30: qty 10

## 2015-12-30 MED ORDER — ACETAMINOPHEN 325 MG PO TABS
650.0000 mg | ORAL_TABLET | Freq: Once | ORAL | Status: AC
  Administered 2015-12-30: 650 mg via ORAL
  Filled 2015-12-30: qty 2

## 2015-12-30 MED ORDER — ALPRAZOLAM 0.5 MG PO TABS
0.5000 mg | ORAL_TABLET | Freq: Two times a day (BID) | ORAL | Status: DC
  Administered 2015-12-30 – 2016-01-08 (×18): 0.5 mg via ORAL
  Filled 2015-12-30 (×18): qty 1

## 2015-12-30 MED ORDER — ONDANSETRON HCL 4 MG PO TABS: 4.0000 mg | ORAL_TABLET | Freq: Four times a day (QID) | ORAL | Status: DC | PRN

## 2015-12-30 MED ORDER — ONDANSETRON HCL 4 MG/2ML IJ SOLN: 4.0000 mg | Freq: Four times a day (QID) | INTRAMUSCULAR | Status: DC | PRN

## 2015-12-30 MED ORDER — SODIUM CHLORIDE 0.9 % IV SOLN
250.0000 mL | INTRAVENOUS | Status: DC | PRN
  Administered 2016-01-06: 250 mL via INTRAVENOUS

## 2015-12-30 MED ORDER — SODIUM CHLORIDE 0.9 % IV BOLUS (SEPSIS)
500.0000 mL | Freq: Once | INTRAVENOUS | Status: AC
  Administered 2015-12-30: 500 mL via INTRAVENOUS

## 2015-12-30 MED ORDER — SODIUM CHLORIDE 0.9 % IV SOLN: INTRAVENOUS | Status: DC

## 2015-12-30 MED ORDER — SODIUM CHLORIDE 0.9% FLUSH
3.0000 mL | Freq: Two times a day (BID) | INTRAVENOUS | Status: DC
  Administered 2015-12-30 – 2016-01-07 (×12): 3 mL via INTRAVENOUS

## 2015-12-30 MED ORDER — SODIUM CHLORIDE 0.9% FLUSH: 3.0000 mL | INTRAVENOUS | Status: DC | PRN

## 2015-12-30 MED ORDER — ACETAMINOPHEN 325 MG PO TABS: 650.0000 mg | ORAL_TABLET | Freq: Four times a day (QID) | ORAL | Status: DC | PRN

## 2015-12-30 MED ORDER — ATORVASTATIN CALCIUM 40 MG PO TABS
40.0000 mg | ORAL_TABLET | Freq: Every day | ORAL | Status: DC
  Administered 2015-12-31 – 2016-01-03 (×4): 40 mg via ORAL
  Filled 2015-12-30 (×4): qty 1

## 2015-12-30 MED ORDER — ACETAMINOPHEN 650 MG RE SUPP
650.0000 mg | Freq: Four times a day (QID) | RECTAL | Status: DC | PRN
Start: 2015-12-30 — End: 2016-01-08

## 2015-12-30 MED ORDER — LEVOTHYROXINE SODIUM 100 MCG PO TABS
100.0000 ug | ORAL_TABLET | Freq: Every day | ORAL | Status: DC
  Administered 2015-12-31 – 2016-01-01 (×2): 100 ug via ORAL
  Filled 2015-12-30: qty 1

## 2015-12-30 MED ORDER — DILTIAZEM HCL 100 MG IV SOLR
INTRAVENOUS | Status: AC
  Filled 2015-12-30: qty 100

## 2015-12-30 MED ORDER — HYDROCODONE-ACETAMINOPHEN 5-325 MG PO TABS
1.0000 | ORAL_TABLET | Freq: Two times a day (BID) | ORAL | Status: DC | PRN
  Administered 2016-01-01 – 2016-01-06 (×5): 1 via ORAL
  Filled 2015-12-30 (×6): qty 1

## 2015-12-30 MED ORDER — ASPIRIN EC 81 MG PO TBEC
81.0000 mg | DELAYED_RELEASE_TABLET | Freq: Every day | ORAL | Status: DC
  Administered 2015-12-31 – 2016-01-08 (×9): 81 mg via ORAL
  Filled 2015-12-30 (×9): qty 1

## 2015-12-30 MED ORDER — DILTIAZEM HCL 100 MG IV SOLR
5.0000 mg/h | INTRAVENOUS | Status: DC
  Administered 2015-12-30: 5 mg/h via INTRAVENOUS
  Administered 2015-12-30: 10 mg/h via INTRAVENOUS
  Filled 2015-12-30: qty 100

## 2015-12-30 NOTE — ED Notes (Signed)
Doctor took pt off her losartin and HCTZ 50/12.5 and put her on Losartin 50mg  with lasix 20 mg.  Pt has been taking both.  Lasix started 12/28/2015

## 2015-12-30 NOTE — ED Notes (Signed)
Patient reports of bilateral leg swelling x2 week with episodes of increased shortness of breath although denies at this time. Also complains of right hip pain x1 week. Denies injury. NAD noted in triage.

## 2015-12-30 NOTE — ED Provider Notes (Signed)
CSN: [id number]     Arrival date & time 12/30/15  1745 History   First MD Initiated Contact with Patient 12/30/15 1800     Chief Complaint  Patient presents with  . Leg Swelling    HPI Pt was seen at 1800. Per pt and her family, c/o gradual onset and worsening of persistent pedal edema for the past 2 to 3 weeks. Has been associated with SOB and generalized weakness/fatigue. SOB worsens with ambulation and laying flat. Pt also c/o acute flair of her chronic right hip "pain" for the past several weeks. Pt was evaluated by her PMD this past week for her symptoms, had an XR completed, had her losartan/HCTZ discontinued and changed to: losartan (at a higher dose) and lasix (new). Pt has been mistakenly taking all of these medications. Denies CP/palpitaitons, no cough, no abd pain, no N/V/D, no fevers, no rash, no focal motor weakness, no tingling/numbness in extremities, no injury.     Past Medical History  Diagnosis Date  . Hypertension   . High cholesterol   . GERD (gastroesophageal reflux disease)   . Arthritis   . Anxiety   . Depression   . Cataract   . Hearing reduced   . PONV (postoperative nausea and vomiting)   . H/O partial mastectomy   . Cancer (HCC)     left breast  . Low back pain   . Chronic right hip pain    Past Surgical History  Procedure Laterality Date  . Total knee arthroplasty  2008    left, [facility]  . Elbow surgery      left  . Abdominal hysterectomy    . Knee arthroscopy      right  . Mastectomy, partial  1998    left  . Cataract extraction w/phaco  03/23/2012    Procedure: CATARACT EXTRACTION PHACO AND INTRAOCULAR LENS PLACEMENT (IOC);  Surgeon: [name], MD;  Location: [facility];  Service: Ophthalmology;  Laterality: Right;  CDE:12.73  . Cataract extraction w/phaco  04/20/2012    Procedure: CATARACT EXTRACTION PHACO AND INTRAOCULAR LENS PLACEMENT (IOC);  Surgeon: [name], MD;  Location: [facility];  Service: Ophthalmology;  Laterality: Left;  CDE:7.70   . Yag laser application Left 11/07/2014    Procedure: YAG LASER APPLICATION;  Surgeon: [name], MD;  Location: [facility];  Service: Ophthalmology;  Laterality: Left;  left  . Yag laser application Right 11/21/2014    Procedure: YAG LASER APPLICATION;  Surgeon: [name], MD;  Location: [facility];  Service: Ophthalmology;  Laterality: Right;  right    Social History  Substance Use Topics  . Smoking status: Never Smoker   . Smokeless tobacco: None  . Alcohol Use: No    Review of Systems ROS: Statement: All systems negative except as marked or noted in the HPI; Constitutional: Negative for fever and chills. +generalized weakness. ; ; Eyes: Negative for eye pain, redness and discharge. ; ; ENMT: Negative for ear pain, hoarseness, nasal congestion, sinus pressure and sore throat. ; ; Cardiovascular: Negative for chest pain, palpitations, diaphoresis, +dyspnea and peripheral edema. ; ; Respiratory: Negative for cough, wheezing and stridor. ; ; Gastrointestinal: Negative for nausea, vomiting, diarrhea, abdominal pain, blood in stool, hematemesis, jaundice and rectal bleeding. . ; ; Genitourinary: Negative for dysuria, flank pain and hematuria. ; ; Musculoskeletal: +right hip pain. Negative for back pain and neck pain. Negative for swelling and trauma.; ; Skin: Negative for pruritus, rash, abrasions, blisters, bruising and skin  lesion.; ; Neuro: Negative for headache, lightheadedness and neck stiffness. Negative for altered level of consciousness , altered mental status, extremity weakness, paresthesias, involuntary movement, seizure and syncope.      Allergies  Review of patient's allergies indicates no known allergies.  Home Medications   Prior to Admission medications   Medication Sig Start Date End Date Taking? Authorizing Provider  ALPRAZolam Prudy Feeler) 0.5 MG tablet Take 0.5 mg by mouth 2 (two) times daily. For anxiety   Yes Historical Provider, MD  amLODipine (NORVASC) 5 MG tablet Take  5 mg by mouth daily.   Yes Historical Provider, MD  aspirin EC 81 MG tablet Take 81 mg by mouth daily.   Yes Historical Provider, MD  atorvastatin (LIPITOR) 40 MG tablet Take 40 mg by mouth daily.   Yes Historical Provider, MD  cetirizine (ZYRTEC) 10 MG tablet Take 10 mg by mouth daily.   Yes Historical Provider, MD  citalopram (CELEXA) 10 MG tablet Take 10 mg by mouth at bedtime.  09/15/14  Yes Historical Provider, MD  ferrous sulfate 325 (65 FE) MG tablet Take 325 mg by mouth daily with breakfast.   Yes Historical Provider, MD  furosemide (LASIX) 20 MG tablet Take 20 mg by mouth daily.   Yes Historical Provider, MD  HYDROcodone-acetaminophen (NORCO/VICODIN) 5-325 MG per tablet Take 1 tablet by mouth 2 (two) times daily as needed for moderate pain.  09/28/14  Yes Historical Provider, MD  levothyroxine (SYNTHROID, LEVOTHROID) 100 MCG tablet Take 100 mcg by mouth daily before breakfast.   Yes Historical Provider, MD  losartan (COZAAR) 50 MG tablet Take 50 mg by mouth daily.   Yes Historical Provider, MD  methocarbamol (ROBAXIN) 500 MG tablet Take 500 mg by mouth daily as needed for muscle spasms.   Yes Historical Provider, MD  metoprolol succinate (TOPROL-XL) 50 MG 24 hr tablet Take 1 tablet (50 mg total) by mouth daily. 07/15/15  Yes Avon Gully, MD  omeprazole (PRILOSEC) 20 MG capsule Take 20 mg by mouth daily. 10/07/14  Yes Historical Provider, MD   BP 112/96 mmHg  Pulse 132  Resp 21  Ht 5\' 8"  (1.727 m)  Wt 246 lb (111.585 kg)  BMI 37.41 kg/m2  SpO2 97%   18:57 Orthostatic Vital Signs CS  Orthostatic Lying  - BP- Lying: 112/96 mmHg ; Pulse- Lying: 130  Orthostatic Sitting - BP- Sitting: 100/76 mmHg ; Pulse- Sitting: 127  Orthostatic Standing at 0 minutes - BP- Standing at 0 minutes: 90/80 mmHg ; Pulse- Standing at 0 minutes: 115     Patient Vitals for the past 24 hrs:  BP Temp Temp src Pulse Resp SpO2 Height Weight  12/30/15 2030 101/87 mmHg - - - 24 - - -  12/30/15 2015 - - - - 24  99 % - -  12/30/15 2000 97/80 mmHg - - (!) 126 16 99 % - -  12/30/15 1945 108/85 mmHg - - 108 22 99 % - -  12/30/15 1930 101/75 mmHg - - (!) 128 19 100 % - -  12/30/15 1915 - - - (!) 129 17 (!) 89 % - -  12/30/15 1900 100/76 mmHg - - - 24 - - -  12/30/15 1859 100/76 mmHg 97.9 F (36.6 C) Oral (!) 126 16 100 % - -  12/30/15 1843 112/96 mmHg - - - 21 - - -  12/30/15 1750 (!) 102/49 mmHg - - (!) 132 18 97 % 5\' 8"  (1.727 m) 246 lb (111.585 kg)  Physical Exam  1805: Physical examination:  Nursing notes reviewed; Vital signs and O2 SAT reviewed;  Constitutional: Well developed, Well nourished, In no acute distress; Head:  Normocephalic, atraumatic; Eyes: EOMI, PERRL, No scleral icterus; ENMT: Mouth and pharynx normal, Mucous membranes dry; Neck: Supple, Full range of motion, No lymphadenopathy; Cardiovascular: Irregular rate and rhythm, No gallop; Respiratory: Breath sounds coarse & equal bilaterally, No wheezes.  Speaking full sentences with ease, Normal respiratory effort/excursion; Chest: Nontender, Movement normal; Abdomen: Soft, Nontender, Nondistended, Normal bowel sounds; Genitourinary: No CVA tenderness; Spine:  No midline CS, TS, LS tenderness. +mild TTP right lumbar paraspinal muscles.;; Extremities: Pulses normal, Pelvis stable. +mild right hip tenderness to palp, no deformity, NMS intact distally RLE.  +2-3 pedal edema bilat. No calf asymmetry.; Neuro: AA&Ox3, +HOH, otherwise major CN grossly intact.  No facial droop. Speech clear. No gross focal motor deficits in extremities.; Skin: Color normal, Warm, Dry.   ED Course  Procedures (including critical care time) Labs Review  Imaging Review  I have personally reviewed and evaluated these images and lab results as part of my medical decision-making.   EKG Interpretation   Date/Time:  Sunday December 30 2015 18:42:51 EST Ventricular Rate:  130 PR Interval:  156 QRS Duration: 154 QT Interval:  374 QTC Calculation: 550 R Axis:    -56 Text Interpretation:  Atrial fibrillation Left bundle branch block When  compared with ECG of 07/13/2015 QT has lengthened Confirmed by [facility]  MD,  Nicholos Johns [phone]) on 12/30/2015 6:49:47 PM      MDM  MDM Reviewed: previous chart, nursing note and vitals Reviewed previous: labs and ECG Interpretation: labs, ECG and x-ray Total time providing critical care: 30-74 minutes. This excludes time spent performing separately reportable procedures and services. Consults: admitting MD    CRITICAL CARE Performed by: Laray Anger Total critical care time: 35 minutes Critical care time was exclusive of separately billable procedures and treating other patients. Critical care was necessary to treat or prevent imminent or life-threatening deterioration. Critical care was time spent personally by me on the following activities: development of treatment plan with patient and/or surrogate as well as nursing, discussions with consultants, evaluation of patient's response to treatment, examination of patient, obtaining history from patient or surrogate, ordering and performing treatments and interventions, ordering and review of laboratory studies, ordering and review of radiographic studies, pulse oximetry and re-evaluation of patient's condition.   Results for orders placed or performed during the hospital encounter of 12/30/15  Basic metabolic panel  Result Value Ref Range   Sodium 136 135 - 145 mmol/L   Potassium 3.9 3.5 - 5.1 mmol/L   Chloride 101 101 - 111 mmol/L   CO2 21 (L) 22 - 32 mmol/L   Glucose, Bld 104 (H) 65 - 99 mg/dL   BUN 36 (H) 6 - 20 mg/dL   Creatinine, Ser 6.04 (H) 0.44 - 1.00 mg/dL   Calcium 8.5 (L) 8.9 - 10.3 mg/dL   GFR calc non Af Amer 17 (L) >60 mL/min   GFR calc Af Amer 19 (L) >60 mL/min   Anion gap 14 5 - 15  Brain natriuretic peptide  Result Value Ref Range   B Natriuretic Peptide 855.0 (H) 0.0 - 100.0 pg/mL  Troponin I  Result Value Ref Range   Troponin I  0.04 (H) <0.031 ng/mL  CBC with Differential  Result Value Ref Range   WBC 5.7 4.0 - 10.5 K/uL   RBC 4.06 3.87 - 5.11 MIL/uL   Hemoglobin 12.4  12.0 - 15.0 g/dL   HCT 16.1 09.6 - 04.5 %   MCV 94.3 78.0 - 100.0 fL   MCH 30.5 26.0 - 34.0 pg   MCHC 32.4 30.0 - 36.0 g/dL   RDW 40.9 (H) 81.1 - 91.4 %   Platelets 154 150 - 400 K/uL   Neutrophils Relative % 69 %   Neutro Abs 3.9 1.7 - 7.7 K/uL   Lymphocytes Relative 19 %   Lymphs Abs 1.1 0.7 - 4.0 K/uL   Monocytes Relative 10 %   Monocytes Absolute 0.6 0.1 - 1.0 K/uL   Eosinophils Relative 1 %   Eosinophils Absolute 0.1 0.0 - 0.7 K/uL   Basophils Relative 1 %   Basophils Absolute 0.0 0.0 - 0.1 K/uL   Dg Chest 2 View 12/30/2015  CLINICAL DATA:  Weakness, leg swelling EXAM: CHEST  2 VIEW COMPARISON:  CTA chest dated 07/13/2015 FINDINGS: Cardiomegaly with mild interstitial/perihilar edema. Suspected small right pleural effusion. No pneumothorax. Mild degenerative changes of the visualized thoracolumbar spine. IMPRESSION: Cardiomegaly with mild interstitial edema and suspected small right pleural effusion. Electronically Signed   By: Charline Bills M.D.   On: 12/30/2015 19:23   Dg Hip Unilat With Pelvis 2-3 Views Right 12/28/2015  CLINICAL DATA:  Pain for 2 weeks.  No history of recent trauma EXAM: DG HIP (WITH OR WITHOUT PELVIS) 2-3V RIGHT COMPARISON:  None. FINDINGS: Frontal pelvis as well as frontal and lateral right hip images were obtained. There is no demonstrable fracture or dislocation. There is mild symmetric narrowing of both hip joints. No erosive change. There is degenerative change in each sacroiliac joint and pubic symphysis regions as well. IMPRESSION: Degenerative type/osteoarthritic change in the visualized joint spaces. No fracture or dislocation. Electronically Signed   By: Bretta Bang III M.D.   On: 12/28/2015 13:43   Results for BAILA, ROUSE (MRN [id number]) as of 12/30/2015 21:06  Ref. Range 07/13/2015 15:20 07/14/2015  05:40 12/30/2015 18:30  BUN Latest Ref Range: 6-20 mg/dL 18 18 36 (H)  Creatinine Latest Ref Range: 0.44-1.00 mg/dL 0.86 (H) 5.78 (H) 4.69 (H)    2030:  Monitor afib/RVR, rates 120-130's. IV cardizem bolus and gtt started with slow improvement of HR to 90-100's. Judicious IVF bolus given for hypotension. APAP given for chronic hip pain. Dx and testing d/w pt and family.  Questions answered.  Verb understanding, agreeable to admit.  T/C to [facility] Dr. Sharl Ma, case discussed, including:  HPI, pertinent PM/SHx, VS/PE, dx testing, ED course and treatment:  Agreeable to admit, requests to write temporary orders, obtain stepdown bed to Dr. Letitia Neri service.   Samuel Jester, DO 01/02/16 [phone]

## 2015-12-30 NOTE — H&P (Addendum)
PCP:   FANTA,TESFAYE, MD   Chief Complaint:  Leg swelling  HPI: 82 year old female who   has a past medical history of Hypertension; High cholesterol; GERD (gastroesophageal reflux disease); Arthritis; Anxiety; Depression; Cataract; Hearing reduced; PONV (postoperative nausea and vomiting); H/O partial mastectomy; Cancer (HCC); Low back pain; and Chronic right hip pain. Today presents to the hospital with worsening lower extremity swelling for past 2-3 weeks. Patient also complaining of generalized fatigue. Also complains of chronic right hip pain for past several weeks. She was seen by her primary care physician last week for the symptoms at that time losartan HCTZ was discontinued and changed to higher dose losartan with Lasix. Patient had been mistakenly taking all these medications. She denies cough, no abdominal pain. Also complains of nausea but no diarrhea. Patient has had poor by mouth intake over the past few weeks due to dry heaves and nausea. In the ED patient found to be in acute kidney injury. EKG showed atrial fibrillation, patient started on Cardizem drip  Allergies:  No Known Allergies    Past Medical History  Diagnosis Date  . Hypertension   . High cholesterol   . GERD (gastroesophageal reflux disease)   . Arthritis   . Anxiety   . Depression   . Cataract   . Hearing reduced   . PONV (postoperative nausea and vomiting)   . H/O partial mastectomy   . Cancer (HCC)     left breast  . Low back pain   . Chronic right hip pain     Past Surgical History  Procedure Laterality Date  . Total knee arthroplasty  2008    left, [facility]  . Elbow surgery      left  . Abdominal hysterectomy    . Knee arthroscopy      right  . Mastectomy, partial  1998    left  . Cataract extraction w/phaco  03/23/2012    Procedure: CATARACT EXTRACTION PHACO AND INTRAOCULAR LENS PLACEMENT (IOC);  Surgeon: Loraine Leriche T. Nile Riggs, MD;  Location: [facility];  Service: Ophthalmology;  Laterality: Right;   CDE:12.73  . Cataract extraction w/phaco  04/20/2012    Procedure: CATARACT EXTRACTION PHACO AND INTRAOCULAR LENS PLACEMENT (IOC);  Surgeon: Loraine Leriche T. Nile Riggs, MD;  Location: [facility];  Service: Ophthalmology;  Laterality: Left;  CDE:7.70  . Yag laser application Left 11/07/2014    Procedure: YAG LASER APPLICATION;  Surgeon: Loraine Leriche T. Nile Riggs, MD;  Location: [facility];  Service: Ophthalmology;  Laterality: Left;  left  . Yag laser application Right 11/21/2014    Procedure: YAG LASER APPLICATION;  Surgeon: Loraine Leriche T. Nile Riggs, MD;  Location: [facility];  Service: Ophthalmology;  Laterality: Right;  right    Prior to Admission medications   Medication Sig Start Date End Date Taking? Authorizing Provider  ALPRAZolam Prudy Feeler) 0.5 MG tablet Take 0.5 mg by mouth 2 (two) times daily. For anxiety   Yes Historical Provider, MD  amLODipine (NORVASC) 5 MG tablet Take 5 mg by mouth daily.   Yes Historical Provider, MD  aspirin EC 81 MG tablet Take 81 mg by mouth daily.   Yes Historical Provider, MD  atorvastatin (LIPITOR) 40 MG tablet Take 40 mg by mouth daily.   Yes Historical Provider, MD  cetirizine (ZYRTEC) 10 MG tablet Take 10 mg by mouth daily.   Yes Historical Provider, MD  citalopram (CELEXA) 10 MG tablet Take 10 mg by mouth at bedtime.  09/15/14  Yes Historical Provider, MD  ferrous sulfate 325 (65 FE)  MG tablet Take 325 mg by mouth daily with breakfast.   Yes Historical Provider, MD  furosemide (LASIX) 20 MG tablet Take 20 mg by mouth daily.   Yes Historical Provider, MD  HYDROcodone-acetaminophen (NORCO/VICODIN) 5-325 MG per tablet Take 1 tablet by mouth 2 (two) times daily as needed for moderate pain.  09/28/14  Yes Historical Provider, MD  levothyroxine (SYNTHROID, LEVOTHROID) 100 MCG tablet Take 100 mcg by mouth daily before breakfast.   Yes Historical Provider, MD  losartan (COZAAR) 50 MG tablet Take 50 mg by mouth daily.   Yes Historical Provider, MD  methocarbamol (ROBAXIN) 500 MG tablet Take 500 mg by mouth  daily as needed for muscle spasms.   Yes Historical Provider, MD  metoprolol succinate (TOPROL-XL) 50 MG 24 hr tablet Take 1 tablet (50 mg total) by mouth daily. 07/15/15  Yes Avon Gully, MD  omeprazole (PRILOSEC) 20 MG capsule Take 20 mg by mouth daily. 10/07/14  Yes Historical Provider, MD    Social History:  reports that she has never smoked. She does not have any smokeless tobacco history on file. She reports that she does not drink alcohol or use illicit drugs.    Filed Weights   12/30/15 1750  Weight: 111.585 kg (246 lb)    All the positives are listed in BOLD  Review of Systems:  HEENT: Headache, blurred vision, runny nose, sore throat Neck: Hypothyroidism, hyperthyroidism,,lymphadenopathy Chest : Shortness of breath, history of COPD, Asthma Heart : Chest pain, history of coronary arterey disease GI:  Nausea, vomiting, diarrhea, constipation, GERD GU: Dysuria, urgency, frequency of urination, hematuria Neuro: Stroke, seizures, syncope Psych: Depression, anxiety, hallucinations   Physical Exam: Blood pressure 101/87, pulse 126, temperature 97.9 F (36.6 C), temperature source Oral, resp. rate 24, height  (1.727 m), weight 111.585 kg (246 lb), SpO2 99 %. Constitutional:   Patient is a well-developed and well-nourished female in no acute distress and cooperative with exam. Head: Normocephalic and atraumatic Mouth: Mucus membranes moist Eyes: PERRL, EOMI, conjunctivae normal Neck: Supple, No Thyromegaly Cardiovascular: RRR, S1 normal, S2 normal Pulmonary/Chest: CTAB, no wheezes, rales, or rhonchi Abdominal: Soft. Non-tender, non-distended, bowel sounds are normal, no masses, organomegaly, or guarding present.  Neurological: A&O x3, Strength is normal and symmetric bilaterally, cranial nerve II-XII are grossly intact, no focal motor deficit, sensory intact to light touch bilaterally.  Extremities : No Cyanosis, Clubbing. Bilateral 2+ pitting edema  Labs on  Admission:  Basic Metabolic Panel:  Recent Labs Lab 12/30/15 1830  NA 136  K 3.9  CL 101  CO2 21*  GLUCOSE 104*  BUN 36*  CREATININE 2.39*  CALCIUM 8.5*   CBC:  Recent Labs Lab 12/30/15 1830  WBC 5.7  NEUTROABS 3.9  HGB 12.4  HCT 38.3  MCV 94.3  PLT 154   Cardiac Enzymes:  Recent Labs Lab 12/30/15 1830  TROPONINI 0.04*    BNP (last 3 results)  Recent Labs  12/30/15 1830  BNP 855.0*     Radiological Exams on Admission: Dg Chest 2 View  12/30/2015  CLINICAL DATA:  Weakness, leg swelling EXAM: CHEST  2 VIEW COMPARISON:  CTA chest dated 07/13/2015 FINDINGS: Cardiomegaly with mild interstitial/perihilar edema. Suspected small right pleural effusion. No pneumothorax. Mild degenerative changes of the visualized thoracolumbar spine. IMPRESSION: Cardiomegaly with mild interstitial edema and suspected small right pleural effusion. Electronically Signed   By: Charline Bills M.D.   On: 12/30/2015 19:23    EKG: Independently reviewed. Atrial fibrillation   Assessment/Plan Active Problems:  Atrial fibrillation (HCC)   AKI (acute kidney injury) (HCC)      Atrial fibrillation Continue Cardizem infusion, will admit the patient to stepdown unit. Continue aspirin Chad2 VASC score is 5, patient currently on aspirin. As per cardiology note from 2014 patient not a Coumadin candidate due to high risk of falls  Acute kidney injury Patient has worsening of BUN/creatinine after being on both HCTZ/Cozaar and Lasix. Her baseline creatinine is around 1.4 Will start gentle IV hydration. Follow BMP in a.m.  Elevated troponin Patient has mild elevation of 0.04, she has no chest pain at this time. We'll cycle troponin every 6 hours 3  Chronic systolic heart failure Currently patient doesn't appear to be in acute exacerbation. Will hold Lasix and she has been started on gentle IV hydration as above. Consider restarting Lasix in a.m. if patient starts developing shortness  of breath  Chronic hip pain X-ray of the hip shows no acute fracture, shows degenerative joint disease Continue with Vicodin when necessary for pain  DVT prophylaxis Lovenox  Code status: Full code  Family discussion: Admission, patients condition and plan of care including tests being ordered have been discussed with the patient and her husband at bedside* who indicate understanding and agree with the plan and Code Status.   Time Spent on Admission: 60 min  Lonnie Rosado S [facility] Pager: [phone] 12/30/2015, 8:59 PM  If 7PM-7AM, please contact night-coverage  [email or web address]  Password [email or web address]

## 2015-12-30 NOTE — ED Notes (Signed)
Room air pulse ox 90%.  Placed on 2 liters nasal cannula.

## 2015-12-31 DIAGNOSIS — I5033 Acute on chronic diastolic (congestive) heart failure: Secondary | ICD-10-CM

## 2015-12-31 DIAGNOSIS — N179 Acute kidney failure, unspecified: Secondary | ICD-10-CM | POA: Diagnosis not present

## 2015-12-31 DIAGNOSIS — I482 Chronic atrial fibrillation: Secondary | ICD-10-CM | POA: Diagnosis not present

## 2015-12-31 DIAGNOSIS — R7989 Other specified abnormal findings of blood chemistry: Secondary | ICD-10-CM | POA: Diagnosis not present

## 2015-12-31 LAB — COMPREHENSIVE METABOLIC PANEL
ALT: 38 U/L (ref 14–54)
ANION GAP: 11 (ref 5–15)
AST: 46 U/L — ABNORMAL HIGH (ref 15–41)
Albumin: 3.3 g/dL — ABNORMAL LOW (ref 3.5–5.0)
Alkaline Phosphatase: 129 U/L — ABNORMAL HIGH (ref 38–126)
BUN: 35 mg/dL — ABNORMAL HIGH (ref 6–20)
CHLORIDE: 101 mmol/L (ref 101–111)
CO2: 23 mmol/L (ref 22–32)
CREATININE: 2.19 mg/dL — AB (ref 0.44–1.00)
Calcium: 8.3 mg/dL — ABNORMAL LOW (ref 8.9–10.3)
GFR, EST AFRICAN AMERICAN: 21 mL/min — AB (ref >60)
GFR, EST NON AFRICAN AMERICAN: 18 mL/min — AB (ref >60)
Glucose, Bld: 97 mg/dL (ref 65–99)
POTASSIUM: 3.4 mmol/L — AB (ref 3.5–5.1)
SODIUM: 135 mmol/L (ref 135–145)
Total Bilirubin: 1.8 mg/dL — ABNORMAL HIGH (ref 0.3–1.2)
Total Protein: 5.9 g/dL — ABNORMAL LOW (ref 6.5–8.1)

## 2015-12-31 LAB — URINALYSIS, ROUTINE W REFLEX MICROSCOPIC
BILIRUBIN URINE: NEGATIVE
GLUCOSE, UA: NEGATIVE mg/dL
HGB URINE DIPSTICK: NEGATIVE
Ketones, ur: NEGATIVE mg/dL
Leukocytes, UA: NEGATIVE
Nitrite: NEGATIVE
PROTEIN: NEGATIVE mg/dL
SPECIFIC GRAVITY, URINE: 1.02 (ref 1.005–1.030)
pH: 5.5 (ref 5.0–8.0)

## 2015-12-31 LAB — CBC
HCT: 37.2 % (ref 36.0–46.0)
Hemoglobin: 12.1 g/dL (ref 12.0–15.0)
MCH: 30.7 pg (ref 26.0–34.0)
MCHC: 32.5 g/dL (ref 30.0–36.0)
MCV: 94.4 fL (ref 78.0–100.0)
PLATELETS: 159 10*3/uL (ref 150–400)
RBC: 3.94 MIL/uL (ref 3.87–5.11)
RDW: 17.4 % — AB (ref 11.5–15.5)
WBC: 5.3 10*3/uL (ref 4.0–10.5)

## 2015-12-31 LAB — TROPONIN I
TROPONIN I: 0.04 ng/mL — AB (ref <0.031)
TROPONIN I: 0.03 ng/mL (ref <0.031)
Troponin I: 0.03 ng/mL (ref <0.031)

## 2015-12-31 LAB — SODIUM, URINE, RANDOM: SODIUM UR: 47 mmol/L

## 2015-12-31 LAB — MRSA PCR SCREENING: MRSA BY PCR: NEGATIVE

## 2015-12-31 LAB — MAGNESIUM: MAGNESIUM: 1.4 mg/dL — AB (ref 1.7–2.4)

## 2015-12-31 LAB — CREATININE, URINE, RANDOM: CREATININE, URINE: 205.22 mg/dL

## 2015-12-31 LAB — TSH: TSH: 6.674 u[IU]/mL — AB (ref 0.350–4.500)

## 2015-12-31 MED ORDER — POTASSIUM CHLORIDE CRYS ER 20 MEQ PO TBCR
40.0000 meq | EXTENDED_RELEASE_TABLET | Freq: Once | ORAL | Status: AC
  Administered 2015-12-31: 40 meq via ORAL
  Filled 2015-12-31: qty 2

## 2015-12-31 MED ORDER — METOPROLOL TARTRATE 25 MG PO TABS
25.0000 mg | ORAL_TABLET | Freq: Four times a day (QID) | ORAL | Status: DC
  Administered 2015-12-31 (×3): 25 mg via ORAL
  Filled 2015-12-31 (×3): qty 1

## 2015-12-31 MED ORDER — ALPRAZOLAM 0.25 MG PO TABS
0.2500 mg | ORAL_TABLET | Freq: Once | ORAL | Status: AC
  Administered 2015-12-31: 0.25 mg via ORAL
  Filled 2015-12-31: qty 1

## 2015-12-31 MED ORDER — METOPROLOL TARTRATE 25 MG PO TABS
37.5000 mg | ORAL_TABLET | Freq: Four times a day (QID) | ORAL | Status: DC
  Administered 2016-01-01 – 2016-01-02 (×8): 37.5 mg via ORAL
  Filled 2015-12-31 (×9): qty 2

## 2015-12-31 NOTE — Progress Notes (Signed)
Patient up to chair at this time. Tolerating well. Husband at bedside.

## 2015-12-31 NOTE — Care Management Note (Signed)
Case Management Note  Patient Details  Name: Michaela Tyler MRN: [id number] Date of Birth: 1921-06-16  Subjective/Objective:                  Pt admitted for a-fib. Pt is from home, lives with husband and is ind with ADL's. Pt has no HH or DME prior to admission. Pt plans to return home with self care at DC. Pt's husband in the room during assessment.   Action/Plan: NO CM needs anticipated. Will cont to follow.   Expected Discharge Date:       01/01/2016           Expected Discharge Plan:  Home/Self Care  In-House Referral:  NA  Discharge planning Services  CM Consult  Post Acute Care Choice:  NA Choice offered to:  NA  DME Arranged:    DME Agency:     HH Arranged:    HH Agency:     Status of Service:  In process, will continue to follow  Medicare Important Message Given:    Date Medicare IM Given:    Medicare IM give by:    Date Additional Medicare IM Given:    Additional Medicare Important Message give by:     If discussed at Long Length of Stay Meetings, dates discussed:    Additional Comments:  Malcolm MetroChildress, Nevaeha Finerty Demske, RN 12/31/2015, 2:50 PM

## 2015-12-31 NOTE — Progress Notes (Addendum)
Patient anxious and having periods of confusion, but easily reoriented. Husband at bedside. Patient request an extra Xanax. Dr. Felecia ShellingFanta notified of anxiety and confusion. Will give a 0.25 dose of Xanax now.

## 2015-12-31 NOTE — Consult Note (Signed)
Primary cardiologist: Formerly Dr Valera Castle Consulting cardiologist: Dr Dina Rich  Requesting physician: Dr Avon Gully  Clinical Summary Ms. Erler is a 82 y.o.female history of afib, LBBB, anxiety, history of multiple falls has not been on anticoagulation, HTN, admitted with leg swelling and SOB. Recently changed from HCTZ to lasix by her pcp, however appears she continued to take both diuretics at the same time. In ER found to be in afib with RVR, was started on dilt gtt.    ER vitals: p 132 bp 102/49 97% RA Wt 246 lbs, however follow up weights 251 x2.  Hgb 12.4, Plt 154, trop I 0.04-->0.04..>0.03, BNP 855, K 3.9, Cr 2.39 (baseline 1.3-1.4), K 3.9 CXR cardiomegaly, mild edema EKG: narrow complex regular tachycardia 06/2015 echo LVEF 50-55%, abnormal diastolic function, mild MR   No Known Allergies  Medications Scheduled Medications: . ALPRAZolam  0.5 mg Oral BID  . aspirin EC  81 mg Oral Daily  . atorvastatin  40 mg Oral Daily  . citalopram  10 mg Oral QHS  . enoxaparin (LOVENOX) injection  30 mg Subcutaneous Q24H  . levothyroxine  100 mcg Oral QAC breakfast  . loratadine  10 mg Oral Daily  . pantoprazole  40 mg Oral Daily  . sodium chloride flush  3 mL Intravenous Q12H     Infusions: . sodium chloride 75 mL/hr at 12/30/15 2230  . diltiazem (CARDIZEM) infusion 10 mg/hr (12/31/15 0630)     PRN Medications:  sodium chloride, acetaminophen **OR** acetaminophen, HYDROcodone-acetaminophen, ondansetron **OR** ondansetron (ZOFRAN) IV, sodium chloride flush   Past Medical History  Diagnosis Date  . Hypertension   . High cholesterol   . GERD (gastroesophageal reflux disease)   . Arthritis   . Anxiety   . Depression   . Cataract   . Hearing reduced   . PONV (postoperative nausea and vomiting)   . H/O partial mastectomy   . Cancer (HCC)     left breast  . Low back pain   . Chronic right hip pain     Past Surgical History  Procedure Laterality Date    . Total knee arthroplasty  2008    left, [facility]  . Elbow surgery      left  . Abdominal hysterectomy    . Knee arthroscopy      right  . Mastectomy, partial  1998    left  . Cataract extraction w/phaco  03/23/2012    Procedure: CATARACT EXTRACTION PHACO AND INTRAOCULAR LENS PLACEMENT (IOC);  Surgeon: Loraine Leriche T. Nile Riggs, MD;  Location: [facility];  Service: Ophthalmology;  Laterality: Right;  CDE:12.73  . Cataract extraction w/phaco  04/20/2012    Procedure: CATARACT EXTRACTION PHACO AND INTRAOCULAR LENS PLACEMENT (IOC);  Surgeon: Loraine Leriche T. Nile Riggs, MD;  Location: [facility];  Service: Ophthalmology;  Laterality: Left;  CDE:7.70  . Yag laser application Left 11/07/2014    Procedure: YAG LASER APPLICATION;  Surgeon: Loraine Leriche T. Nile Riggs, MD;  Location: [facility];  Service: Ophthalmology;  Laterality: Left;  left  . Yag laser application Right 11/21/2014    Procedure: YAG LASER APPLICATION;  Surgeon: Loraine Leriche T. Nile Riggs, MD;  Location: [facility];  Service: Ophthalmology;  Laterality: Right;  right    History reviewed. No pertinent family history.  Social History Ms. Riches reports that she has never smoked. She does not have any smokeless tobacco history on file. Ms. Tooley reports that she does not drink alcohol.  Review of Systems CONSTITUTIONAL: No weight loss, fever, chills, weakness or  fatigue.  HEENT: Eyes: No visual loss, blurred vision, double vision or yellow sclerae. No hearing loss, sneezing, congestion, runny nose or sore throat.  SKIN: No rash or itching.  CARDIOVASCULAR: No chest pain, no palpitations.  RESPIRATORY:+ SOB GASTROINTESTINAL: No anorexia, nausea, vomiting or diarrhea. No abdominal pain or blood.  GENITOURINARY: no polyuria, no dysuria NEUROLOGICAL: No headache, dizziness, syncope, paralysis, ataxia, numbness or tingling in the extremities. No change in bowel or bladder control.  MUSCULOSKELETAL: No muscle, back pain, joint pain or stiffness.  HEMATOLOGIC: No anemia, bleeding or bruising.   LYMPHATICS: No enlarged nodes. No history of splenectomy.  PSYCHIATRIC: No history of depression or anxiety.      Physical Examination Blood pressure 94/83, pulse 123, temperature 97.7 F (36.5 C), temperature source Oral, resp. rate 21, height 5\' 8"  (1.727 m), weight 252 lb 13.9 oz (114.7 kg), SpO2 94 %.  Intake/Output Summary (Last 24 hours) at 12/31/15 0848 Last data filed at 12/31/15 0700  Gross per 24 hour  Intake    710 ml  Output      0 ml  Net    710 ml    HEENT: scera clear, throat clear  Cardiovascular: irreg, rate 100, no m/r/g, +elevated JVD  Respiratory: CTAB  GI: abdomen soft, NT, ND  MSK: 1+ bilateral LE edema  Neuro: no focal deficits  Psych: appropriate affect   Lab Results  Basic Metabolic Panel:  Recent Labs Lab 12/30/15 1830 12/31/15 0419  NA 136 135  K 3.9 3.4*  CL 101 101  CO2 21* 23  GLUCOSE 104* 97  BUN 36* 35*  CREATININE 2.39* 2.19*  CALCIUM 8.5* 8.3*    Liver Function Tests:  Recent Labs Lab 12/31/15 0419  AST 46*  ALT 38  ALKPHOS 129*  BILITOT 1.8*  PROT 5.9*  ALBUMIN 3.3*    CBC:  Recent Labs Lab 12/30/15 1830 12/31/15 0419  WBC 5.7 5.3  NEUTROABS 3.9  --   HGB 12.4 12.1  HCT 38.3 37.2  MCV 94.3 94.4  PLT 154 159    Cardiac Enzymes:  Recent Labs Lab 12/30/15 1830 12/30/15 2311 12/31/15 0419  TROPONINI 0.04* 0.04* 0.03    BNP: Invalid input(s): POCBNP   ECG   Imaging   Impression/Recommendations 1. Afib with RVR - off dilt gtt, will start oral lopressro 25mg  po q6hrs with old parameters - has been deemed too high risk for anticoag in the past due to frequent falls,continue ASA  2. AKI - Cr elevated on admission, downtrend overnight. History mixed as she reports taking both lasix and HCTZ by mistake suggesting she may be prerenal, however appears volume overloaded by exam - hold diuretics today, hold ARB. F/u urine studies including FeUrea. Gentle IVFs today is ok  3. Hypokalemia -  40mEq of KCl po today  4. Acute on chronic diastolic HF - appears volume overloaded by exam, BNP is elevated with some pulm edema on CXR. Denies any significant SOB. Etiology of her AKI is unclear at this time, Cr trending down with gentle IVFs. We will continue gently IVFs today, hold on any diuretics today. As her heart rates better controlled her failure should improve as well.    Dina RichJonathan Maurisa Tesmer, M.D.

## 2015-12-31 NOTE — Care Management Obs Status (Signed)
MEDICARE OBSERVATION STATUS NOTIFICATION   Patient Details  Name: Michaela Tyler MRN: [id number] Date of Birth: 1921-09-17   Medicare Observation Status Notification Given:  Yes    Malcolm MetroChildress, Nalayah Hitt Demske, RN 12/31/2015, 2:50 PM

## 2015-12-31 NOTE — Progress Notes (Signed)
Patients heart rate running between 102-130 Afib at this time. PO lopressor was given at 1530. Dr. Janna Archondiego on call and notified. No new orders at this time. Will give Lopressor time to get into her system.

## 2015-12-31 NOTE — Progress Notes (Signed)
Subjective: Patient was admitted yesterday due to atrial fibrillation and worsening acute renal failure. Patient also has bilateral leg edema. Complains of right hip and feeling sick and weak. No chest pain.  Objective: Vital signs in last 24 hours: Temp:  [96.6 F (35.9 C)-97.9 F (36.6 C)] 96.6 F (35.9 C) (03/06 0400) Pulse Rate:  [45-139] 123 (03/06 0700) Resp:  [16-29] 21 (03/06 0700) BP: (75-128)/(35-111) 94/83 mmHg (03/06 0700) SpO2:  [87 %-100 %] 94 % (03/06 0700) Weight:  [111.585 kg (246 lb)-114.7 kg (252 lb 13.9 oz)] 114.7 kg (252 lb 13.9 oz) (03/06 0500) Weight change:  Last BM Date: 12/28/15  Intake/Output from previous day: 03/05 0701 - 03/06 0700 In: 710 [I.V.:710] Out: -   PHYSICAL EXAM General appearance: alert, cooperative and moderately obese Resp: diminished breath sounds bilaterally and rhonchi bilaterally Cardio: regularly irregular rhythm GI: soft, non-tender; bowel sounds normal; no masses,  no organomegaly Extremities: edema 3+++  Lab Results:  Results for orders placed or performed during the hospital encounter of 12/30/15 (from the past 48 hour(s))  Basic metabolic panel     Status: Abnormal   Collection Time: 12/30/15  6:30 PM  Result Value Ref Range   Sodium 136 135 - 145 mmol/L   Potassium 3.9 3.5 - 5.1 mmol/L   Chloride 101 101 - 111 mmol/L   CO2 21 (L) 22 - 32 mmol/L   Glucose, Bld 104 (H) 65 - 99 mg/dL   BUN 36 (H) 6 - 20 mg/dL   Creatinine, Ser 2.39 (H) 0.44 - 1.00 mg/dL   Calcium 8.5 (L) 8.9 - 10.3 mg/dL   GFR calc non Af Amer 17 (L) >60 mL/min   GFR calc Af Amer 19 (L) >60 mL/min    Comment: (NOTE) The eGFR has been calculated using the CKD EPI equation. This calculation has not been validated in all clinical situations. eGFR's persistently <60 mL/min signify possible Chronic Kidney Disease.    Anion gap 14 5 - 15  Brain natriuretic peptide     Status: Abnormal   Collection Time: 12/30/15  6:30 PM  Result Value Ref Range   B  Natriuretic Peptide 855.0 (H) 0.0 - 100.0 pg/mL  Troponin I     Status: Abnormal   Collection Time: 12/30/15  6:30 PM  Result Value Ref Range   Troponin I 0.04 (H) <0.031 ng/mL    Comment:        PERSISTENTLY INCREASED TROPONIN VALUES IN THE RANGE OF 0.04-0.49 ng/mL CAN BE SEEN IN:       -UNSTABLE ANGINA       -CONGESTIVE HEART FAILURE       -MYOCARDITIS       -CHEST TRAUMA       -ARRYHTHMIAS       -LATE PRESENTING MYOCARDIAL INFARCTION       -COPD   CLINICAL FOLLOW-UP RECOMMENDED.   CBC with Differential     Status: Abnormal   Collection Time: 12/30/15  6:30 PM  Result Value Ref Range   WBC 5.7 4.0 - 10.5 K/uL   RBC 4.06 3.87 - 5.11 MIL/uL   Hemoglobin 12.4 12.0 - 15.0 g/dL   HCT 38.3 36.0 - 46.0 %   MCV 94.3 78.0 - 100.0 fL   MCH 30.5 26.0 - 34.0 pg   MCHC 32.4 30.0 - 36.0 g/dL   RDW 17.3 (H) 11.5 - 15.5 %   Platelets 154 150 - 400 K/uL   Neutrophils Relative % 69 %   Neutro Abs 3.9  1.7 - 7.7 K/uL   Lymphocytes Relative 19 %   Lymphs Abs 1.1 0.7 - 4.0 K/uL   Monocytes Relative 10 %   Monocytes Absolute 0.6 0.1 - 1.0 K/uL   Eosinophils Relative 1 %   Eosinophils Absolute 0.1 0.0 - 0.7 K/uL   Basophils Relative 1 %   Basophils Absolute 0.0 0.0 - 0.1 K/uL  MRSA PCR Screening     Status: None   Collection Time: 12/30/15 10:38 PM  Result Value Ref Range   MRSA by PCR NEGATIVE NEGATIVE    Comment:        The GeneXpert MRSA Assay (FDA approved for NASAL specimens only), is one component of a comprehensive MRSA colonization surveillance program. It is not intended to diagnose MRSA infection nor to guide or monitor treatment for MRSA infections.   Troponin I     Status: Abnormal   Collection Time: 12/30/15 11:11 PM  Result Value Ref Range   Troponin I 0.04 (H) <0.031 ng/mL    Comment:        PERSISTENTLY INCREASED TROPONIN VALUES IN THE RANGE OF 0.04-0.49 ng/mL CAN BE SEEN IN:       -UNSTABLE ANGINA       -CONGESTIVE HEART FAILURE       -MYOCARDITIS        -CHEST TRAUMA       -ARRYHTHMIAS       -LATE PRESENTING MYOCARDIAL INFARCTION       -COPD   CLINICAL FOLLOW-UP RECOMMENDED.   Troponin I     Status: None   Collection Time: 12/31/15  4:19 AM  Result Value Ref Range   Troponin I 0.03 <0.031 ng/mL    Comment:        NO INDICATION OF MYOCARDIAL INJURY.   CBC     Status: Abnormal   Collection Time: 12/31/15  4:19 AM  Result Value Ref Range   WBC 5.3 4.0 - 10.5 K/uL   RBC 3.94 3.87 - 5.11 MIL/uL   Hemoglobin 12.1 12.0 - 15.0 g/dL   HCT 37.2 36.0 - 46.0 %   MCV 94.4 78.0 - 100.0 fL   MCH 30.7 26.0 - 34.0 pg   MCHC 32.5 30.0 - 36.0 g/dL   RDW 17.4 (H) 11.5 - 15.5 %   Platelets 159 150 - 400 K/uL  Comprehensive metabolic panel     Status: Abnormal   Collection Time: 12/31/15  4:19 AM  Result Value Ref Range   Sodium 135 135 - 145 mmol/L   Potassium 3.4 (L) 3.5 - 5.1 mmol/L   Chloride 101 101 - 111 mmol/L   CO2 23 22 - 32 mmol/L   Glucose, Bld 97 65 - 99 mg/dL   BUN 35 (H) 6 - 20 mg/dL   Creatinine, Ser 2.19 (H) 0.44 - 1.00 mg/dL   Calcium 8.3 (L) 8.9 - 10.3 mg/dL   Total Protein 5.9 (L) 6.5 - 8.1 g/dL   Albumin 3.3 (L) 3.5 - 5.0 g/dL   AST 46 (H) 15 - 41 U/L   ALT 38 14 - 54 U/L   Alkaline Phosphatase 129 (H) 38 - 126 U/L   Total Bilirubin 1.8 (H) 0.3 - 1.2 mg/dL   GFR calc non Af Amer 18 (L) >60 mL/min   GFR calc Af Amer 21 (L) >60 mL/min    Comment: (NOTE) The eGFR has been calculated using the CKD EPI equation. This calculation has not been validated in all clinical situations. eGFR's persistently <60 mL/min signify possible  Chronic Kidney Disease.    Anion gap 11 5 - 15    ABGS No results for input(s): PHART, PO2ART, TCO2, HCO3 in the last 72 hours.  Invalid input(s): PCO2 CULTURES Recent Results (from the past 240 hour(s))  MRSA PCR Screening     Status: None   Collection Time: 12/30/15 10:38 PM  Result Value Ref Range Status   MRSA by PCR NEGATIVE NEGATIVE Final    Comment:        The GeneXpert MRSA  Assay (FDA approved for NASAL specimens only), is one component of a comprehensive MRSA colonization surveillance program. It is not intended to diagnose MRSA infection nor to guide or monitor treatment for MRSA infections.    Studies/Results: Dg Chest 2 View  12/30/2015  CLINICAL DATA:  Weakness, leg swelling EXAM: CHEST  2 VIEW COMPARISON:  CTA chest dated 07/13/2015 FINDINGS: Cardiomegaly with mild interstitial/perihilar edema. Suspected small right pleural effusion. No pneumothorax. Mild degenerative changes of the visualized thoracolumbar spine. IMPRESSION: Cardiomegaly with mild interstitial edema and suspected small right pleural effusion. Electronically Signed   By: Julian Hy M.D.   On: 12/30/2015 19:23    Medications: I have reviewed the patient's current medications.  Assesment:   Active Problems:   Atrial fibrillation (HCC)   AKI (acute kidney injury) ([place]) hypertension DJD Hypothyroidism   Plan: Medications reviewed Will continue Cardizem drip Will monitor BMP Cardiology consult       Nyko Gell 12/31/2015, 8:14 AM

## 2016-01-01 ENCOUNTER — Observation Stay (HOSPITAL_COMMUNITY): Payer: Medicare Other

## 2016-01-01 ENCOUNTER — Observation Stay (HOSPITAL_BASED_OUTPATIENT_CLINIC_OR_DEPARTMENT_OTHER): Payer: Medicare Other

## 2016-01-01 DIAGNOSIS — R0602 Shortness of breath: Secondary | ICD-10-CM | POA: Diagnosis not present

## 2016-01-01 DIAGNOSIS — I447 Left bundle-branch block, unspecified: Secondary | ICD-10-CM

## 2016-01-01 LAB — COMPREHENSIVE METABOLIC PANEL
ALBUMIN: 3.6 g/dL (ref 3.5–5.0)
ALK PHOS: 144 U/L — AB (ref 38–126)
ALT: 62 U/L — AB (ref 14–54)
ANION GAP: 11 (ref 5–15)
AST: 80 U/L — ABNORMAL HIGH (ref 15–41)
BILIRUBIN TOTAL: 2 mg/dL — AB (ref 0.3–1.2)
BUN: 35 mg/dL — ABNORMAL HIGH (ref 6–20)
CALCIUM: 8.7 mg/dL — AB (ref 8.9–10.3)
CO2: 23 mmol/L (ref 22–32)
CREATININE: 2.2 mg/dL — AB (ref 0.44–1.00)
Chloride: 101 mmol/L (ref 101–111)
GFR calc non Af Amer: 18 mL/min — ABNORMAL LOW (ref >60)
GFR, EST AFRICAN AMERICAN: 21 mL/min — AB (ref >60)
GLUCOSE: 99 mg/dL (ref 65–99)
Potassium: 4 mmol/L (ref 3.5–5.1)
Sodium: 135 mmol/L (ref 135–145)
TOTAL PROTEIN: 6.6 g/dL (ref 6.5–8.1)

## 2016-01-01 LAB — URINE CULTURE: Culture: NO GROWTH

## 2016-01-01 LAB — UREA NITROGEN, URINE: Urea Nitrogen, Ur: 589 mg/dL

## 2016-01-01 MED ORDER — MAGNESIUM SULFATE 2 GM/50ML IV SOLN
2.0000 g | Freq: Once | INTRAVENOUS | Status: AC
Start: 2016-01-01 — End: 2016-01-01
  Administered 2016-01-01: 2 g via INTRAVENOUS
  Filled 2016-01-01: qty 50

## 2016-01-01 MED ORDER — AMIODARONE HCL IN DEXTROSE 360-4.14 MG/200ML-% IV SOLN
30.0000 mg/h | INTRAVENOUS | Status: DC
  Administered 2016-01-01 – 2016-01-02 (×2): 30 mg/h via INTRAVENOUS
  Filled 2016-01-01 (×3): qty 200

## 2016-01-01 MED ORDER — AMIODARONE IV BOLUS ONLY 150 MG/100ML
150.0000 mg | Freq: Once | INTRAVENOUS | Status: AC
  Administered 2016-01-01: 150 mg via INTRAVENOUS
  Filled 2016-01-01: qty 100

## 2016-01-01 MED ORDER — FUROSEMIDE 10 MG/ML IJ SOLN
40.0000 mg | Freq: Once | INTRAMUSCULAR | Status: AC
  Administered 2016-01-01: 40 mg via INTRAVENOUS
  Filled 2016-01-01: qty 4

## 2016-01-01 MED ORDER — LEVOTHYROXINE SODIUM 25 MCG PO TABS
125.0000 ug | ORAL_TABLET | Freq: Every day | ORAL | Status: DC
  Administered 2016-01-02 – 2016-01-08 (×7): 125 ug via ORAL
  Filled 2016-01-01 (×9): qty 1

## 2016-01-01 MED ORDER — AMIODARONE HCL IN DEXTROSE 360-4.14 MG/200ML-% IV SOLN
60.0000 mg/h | INTRAVENOUS | Status: DC
  Administered 2016-01-01: 60 mg/h via INTRAVENOUS

## 2016-01-01 MED ORDER — MAGNESIUM OXIDE 400 (241.3 MG) MG PO TABS
400.0000 mg | ORAL_TABLET | Freq: Two times a day (BID) | ORAL | Status: DC
  Administered 2016-01-01 – 2016-01-08 (×15): 400 mg via ORAL
  Filled 2016-01-01 (×15): qty 1

## 2016-01-01 NOTE — Progress Notes (Signed)
*  PRELIMINARY RESULTS* Echocardiogram 2D Echocardiogram has been performed.  Jeryl Columbialliott, Miquel Stacks 01/01/2016, 12:52 PM

## 2016-01-01 NOTE — Progress Notes (Signed)
Primary Cardiologist: Dina Rich MD  Cardiology Specific Problem List: 1. Atrial fib with RVR 2. Acute on Chronic Diastolic CHF  Subjective:    States she is feeling better. No complaints of pain.   Objective:   Temp:  [96.7 F (35.9 C)-97.7 F (36.5 C)] 96.7 F (35.9 C) (03/07 0739) Pulse Rate:  [38-132] 123 (03/07 0600) Resp:  [16-35] 35 (03/07 0600) BP: (68-134)/(47-104) 99/82 mmHg (03/07 0600) SpO2:  [88 %-100 %] 97 % (03/07 0600) Weight:  [251 lb 12.3 oz (114.2 kg)] 251 lb 12.3 oz (114.2 kg) (03/07 0500) Last BM Date: 12/28/15  Filed Weights   12/30/15 2248 12/31/15 0500 01/01/16 0500  Weight: 251 lb 5.2 oz (114 kg) 252 lb 13.9 oz (114.7 kg) 251 lb 12.3 oz (114.2 kg)    Intake/Output Summary (Last 24 hours) at 01/01/16 0851 Last data filed at 01/01/16 0300  Gross per 24 hour  Intake    720 ml  Output    225 ml  Net    495 ml    Telemetry: Atrial fib with rates between 98 and 128 bpm.   Exam:  General: No acute distress.  HEENT: Conjunctiva and lids normal, oropharynx clear.  Lungs: Inspiratory crackles, occasional coughing.   Cardiac: No elevated JVP or bruits. IRRR tachycardic,  no gallop or rub.   Abdomen: Normoactive bowel sounds, nontender, nondistended.  Extremities: Mild  pitting edema, distal pulses full.  Neuropsychiatric: Alert and oriented x3, affect appropriate.  Echocardiogram 07/15/2015 Left ventricle: The cavity size was normal. Systolic function was normal. The estimated ejection fraction was in the range of 50% to 55%. Wall motion was normal; there were no regional wall motion abnormalities. Doppler parameters are consistent with high ventricular filling pressure. - Ventricular septum: Septal motion showed &quot;bounce&quot;. - Aortic valve: Noncoronary cusp mobility was moderately restricted. Transvalvular velocity was minimally increased. There was no stenosis. Peak velocity (S): 209 cm/s. Mean gradient  (S): 10 mm Hg. - Mitral valve: Moderately calcified annulus. Mildly thickened leaflets . There was mild regurgitation. - Left atrium: The atrium was moderately dilated. - Right atrium: The atrium was moderately dilated. - Pulmonary arteries: Systolic pressure was mildly to moderately increased. PA peak pressure: 48 mm Hg (S). - Pericardium, extracardiac: A trivial pericardial effusion was identified.  Lab Results:  Basic Metabolic Panel:  Recent Labs Lab 12/30/15 1830 12/31/15 0419 12/31/15 1012  NA 136 135  --   K 3.9 3.4*  --   CL 101 101  --   CO2 21* 23  --   GLUCOSE 104* 97  --   BUN 36* 35*  --   CREATININE 2.39* 2.19*  --   CALCIUM 8.5* 8.3*  --   MG  --   --  1.4*    Liver Function Tests:  Recent Labs Lab 12/31/15 0419  AST 46*  ALT 38  ALKPHOS 129*  BILITOT 1.8*  PROT 5.9*  ALBUMIN 3.3*    CBC:  Recent Labs Lab 12/30/15 1830 12/31/15 0419  WBC 5.7 5.3  HGB 12.4 12.1  HCT 38.3 37.2  MCV 94.3 94.4  PLT 154 159    Cardiac Enzymes:  Recent Labs Lab 12/30/15 2311 12/31/15 0419 12/31/15 1012  TROPONINI 0.04* 0.03 0.03    Radiology: Dg Chest 2 View  12/30/2015  CLINICAL DATA:  Weakness, leg swelling EXAM: CHEST  2 VIEW COMPARISON:  CTA chest dated 07/13/2015 FINDINGS: Cardiomegaly with mild interstitial/perihilar edema. Suspected small right pleural effusion. No pneumothorax. Mild degenerative  changes of the visualized thoracolumbar spine. IMPRESSION: Cardiomegaly with mild interstitial edema and suspected small right pleural effusion. Electronically Signed   By: Charline BillsSriyesh  Krishnan M.D.   On: 12/30/2015 19:23     Medications:   Scheduled Medications: . ALPRAZolam  0.5 mg Oral BID  . aspirin EC  81 mg Oral Daily  . atorvastatin  40 mg Oral Daily  . citalopram  10 mg Oral QHS  . enoxaparin (LOVENOX) injection  30 mg Subcutaneous Q24H  . [START ON 01/02/2016] levothyroxine  125 mcg Oral QAC breakfast  . loratadine  10 mg Oral Daily   . magnesium oxide  400 mg Oral BID  . magnesium sulfate 1 - 4 g bolus IVPB  2 g Intravenous Once  . metoprolol tartrate  37.5 mg Oral Q6H  . pantoprazole  40 mg Oral Daily  . sodium chloride flush  3 mL Intravenous Q12H      PRN Medications: sodium chloride, acetaminophen **OR** acetaminophen, HYDROcodone-acetaminophen, ondansetron **OR** ondansetron (ZOFRAN) IV, sodium chloride flush   Assessment and Plan:   1. Atrial fib with RVR: Continue rapid HR up to 128 bpm, with avg HR 100 bpm. She states she is breathing better and is without painBP is soft.  She is no longer on IV fluid hydration. Would repeat limited echo for LV function to assist in management.   2. Hypomagnesemia: Was started on po magnesium 400 mg BID by PCP. Mg 1.4. Will give on IV infusion this am.   3. Pulmonary Edema: Subjective breathing status is improved, but continues to have crackles on assessment. She is positive 1.2 liters on I/O. Diuretics are on hold. Creatinine slowly improving.   Bettey MareKathryn M. Lawrence NP [facility]  01/01/2016, 8:51 AM   Attending Note Patient seen and discussed with NP Lyman BishopLawrence, I agree with her documentation above. Rates remain elevated on oral lopressor, increased to 37.5mg  q6 hrs. Soft bp's, do not have room to further titrate lopressor. Would avoid digoxin given her age and poor renal function We will try amiodarone IV today, note mildly elevated LFTs at baseline, will follow.  Volume status remains an issue. History of taking both HCTZ and lasix at home by mistake would increase risk of her being prerenal as the cause of her AKI, however exam supports volume overload. Diuretics have been on hold, she received gentle IVFs with fairly flat Cr. We will repeat CXR, f/u FeUrea for evaluation of prerenal azotemia. Pending results will likely dose some diuretics today. With volume overload and increased Cr also agree with repeat echo.   Dominga FerryJ Harkirat Orozco MD

## 2016-01-01 NOTE — Progress Notes (Signed)
Subjective: Patient feels slightly better. Her heart rate is running in the range 120-130. CMP result is pending.  Objective: Vital signs in last 24 hours: Temp:  [96.7 F (35.9 C)-97.7 F (36.5 C)] 96.7 F (35.9 C) (03/07 0739) Pulse Rate:  [38-132] 123 (03/07 0600) Resp:  [16-35] 35 (03/07 0600) BP: (68-134)/(47-104) 99/82 mmHg (03/07 0600) SpO2:  [88 %-100 %] 97 % (03/07 0600) Weight:  [114.2 kg (251 lb 12.3 oz)] 114.2 kg (251 lb 12.3 oz) (03/07 0500) Weight change: 2.615 kg (5 lb 12.3 oz) Last BM Date: 12/28/15  Intake/Output from previous day: 03/06 0701 - 03/07 0700 In: 720 [P.O.:240; I.V.:480] Out: 225 [Urine:225]  PHYSICAL EXAM General appearance: alert, cooperative and moderately obese Resp: diminished breath sounds bilaterally and rhonchi bilaterally Cardio: regularly irregular rhythm GI: soft, non-tender; bowel sounds normal; no masses,  no organomegaly Extremities: edema 3+++  Lab Results:  Results for orders placed or performed during the hospital encounter of 12/30/15 (from the past 48 hour(s))  Basic metabolic panel     Status: Abnormal   Collection Time: 12/30/15  6:30 PM  Result Value Ref Range   Sodium 136 135 - 145 mmol/L   Potassium 3.9 3.5 - 5.1 mmol/L   Chloride 101 101 - 111 mmol/L   CO2 21 (L) 22 - 32 mmol/L   Glucose, Bld 104 (H) 65 - 99 mg/dL   BUN 36 (H) 6 - 20 mg/dL   Creatinine, Ser 2.39 (H) 0.44 - 1.00 mg/dL   Calcium 8.5 (L) 8.9 - 10.3 mg/dL   GFR calc non Af Amer 17 (L) >60 mL/min   GFR calc Af Amer 19 (L) >60 mL/min    Comment: (NOTE) The eGFR has been calculated using the CKD EPI equation. This calculation has not been validated in all clinical situations. eGFR's persistently <60 mL/min signify possible Chronic Kidney Disease.    Anion gap 14 5 - 15  Brain natriuretic peptide     Status: Abnormal   Collection Time: 12/30/15  6:30 PM  Result Value Ref Range   B Natriuretic Peptide 855.0 (H) 0.0 - 100.0 pg/mL  Troponin I      Status: Abnormal   Collection Time: 12/30/15  6:30 PM  Result Value Ref Range   Troponin I 0.04 (H) <0.031 ng/mL    Comment:        PERSISTENTLY INCREASED TROPONIN VALUES IN THE RANGE OF 0.04-0.49 ng/mL CAN BE SEEN IN:       -UNSTABLE ANGINA       -CONGESTIVE HEART FAILURE       -MYOCARDITIS       -CHEST TRAUMA       -ARRYHTHMIAS       -LATE PRESENTING MYOCARDIAL INFARCTION       -COPD   CLINICAL FOLLOW-UP RECOMMENDED.   CBC with Differential     Status: Abnormal   Collection Time: 12/30/15  6:30 PM  Result Value Ref Range   WBC 5.7 4.0 - 10.5 K/uL   RBC 4.06 3.87 - 5.11 MIL/uL   Hemoglobin 12.4 12.0 - 15.0 g/dL   HCT 38.3 36.0 - 46.0 %   MCV 94.3 78.0 - 100.0 fL   MCH 30.5 26.0 - 34.0 pg   MCHC 32.4 30.0 - 36.0 g/dL   RDW 17.3 (H) 11.5 - 15.5 %   Platelets 154 150 - 400 K/uL   Neutrophils Relative % 69 %   Neutro Abs 3.9 1.7 - 7.7 K/uL   Lymphocytes Relative 19 %  Lymphs Abs 1.1 0.7 - 4.0 K/uL   Monocytes Relative 10 %   Monocytes Absolute 0.6 0.1 - 1.0 K/uL   Eosinophils Relative 1 %   Eosinophils Absolute 0.1 0.0 - 0.7 K/uL   Basophils Relative 1 %   Basophils Absolute 0.0 0.0 - 0.1 K/uL  MRSA PCR Screening     Status: None   Collection Time: 12/30/15 10:38 PM  Result Value Ref Range   MRSA by PCR NEGATIVE NEGATIVE    Comment:        The GeneXpert MRSA Assay (FDA approved for NASAL specimens only), is one component of a comprehensive MRSA colonization surveillance program. It is not intended to diagnose MRSA infection nor to guide or monitor treatment for MRSA infections.   Troponin I     Status: Abnormal   Collection Time: 12/30/15 11:11 PM  Result Value Ref Range   Troponin I 0.04 (H) <0.031 ng/mL    Comment:        PERSISTENTLY INCREASED TROPONIN VALUES IN THE RANGE OF 0.04-0.49 ng/mL CAN BE SEEN IN:       -UNSTABLE ANGINA       -CONGESTIVE HEART FAILURE       -MYOCARDITIS       -CHEST TRAUMA       -ARRYHTHMIAS       -LATE PRESENTING  MYOCARDIAL INFARCTION       -COPD   CLINICAL FOLLOW-UP RECOMMENDED.   Troponin I     Status: None   Collection Time: 12/31/15  4:19 AM  Result Value Ref Range   Troponin I 0.03 <0.031 ng/mL    Comment:        NO INDICATION OF MYOCARDIAL INJURY.   CBC     Status: Abnormal   Collection Time: 12/31/15  4:19 AM  Result Value Ref Range   WBC 5.3 4.0 - 10.5 K/uL   RBC 3.94 3.87 - 5.11 MIL/uL   Hemoglobin 12.1 12.0 - 15.0 g/dL   HCT 37.2 36.0 - 46.0 %   MCV 94.4 78.0 - 100.0 fL   MCH 30.7 26.0 - 34.0 pg   MCHC 32.5 30.0 - 36.0 g/dL   RDW 17.4 (H) 11.5 - 15.5 %   Platelets 159 150 - 400 K/uL  Comprehensive metabolic panel     Status: Abnormal   Collection Time: 12/31/15  4:19 AM  Result Value Ref Range   Sodium 135 135 - 145 mmol/L   Potassium 3.4 (L) 3.5 - 5.1 mmol/L   Chloride 101 101 - 111 mmol/L   CO2 23 22 - 32 mmol/L   Glucose, Bld 97 65 - 99 mg/dL   BUN 35 (H) 6 - 20 mg/dL   Creatinine, Ser 2.19 (H) 0.44 - 1.00 mg/dL   Calcium 8.3 (L) 8.9 - 10.3 mg/dL   Total Protein 5.9 (L) 6.5 - 8.1 g/dL   Albumin 3.3 (L) 3.5 - 5.0 g/dL   AST 46 (H) 15 - 41 U/L   ALT 38 14 - 54 U/L   Alkaline Phosphatase 129 (H) 38 - 126 U/L   Total Bilirubin 1.8 (H) 0.3 - 1.2 mg/dL   GFR calc non Af Amer 18 (L) >60 mL/min   GFR calc Af Amer 21 (L) >60 mL/min    Comment: (NOTE) The eGFR has been calculated using the CKD EPI equation. This calculation has not been validated in all clinical situations. eGFR's persistently <60 mL/min signify possible Chronic Kidney Disease.    Anion gap 11 5 - 15  Urinalysis, Routine w reflex microscopic     Status: None   Collection Time: 12/31/15  8:55 AM  Result Value Ref Range   Color, Urine YELLOW YELLOW   APPearance CLEAR CLEAR   Specific Gravity, Urine 1.020 1.005 - 1.030   pH 5.5 5.0 - 8.0   Glucose, UA NEGATIVE NEGATIVE mg/dL   Hgb urine dipstick NEGATIVE NEGATIVE   Bilirubin Urine NEGATIVE NEGATIVE   Ketones, ur NEGATIVE NEGATIVE mg/dL   Protein,  ur NEGATIVE NEGATIVE mg/dL   Nitrite NEGATIVE NEGATIVE   Leukocytes, UA NEGATIVE NEGATIVE    Comment: MICROSCOPIC NOT DONE ON URINES WITH NEGATIVE PROTEIN, BLOOD, LEUKOCYTES, NITRITE, OR GLUCOSE <1000 mg/dL.  Sodium, urine, random     Status: None   Collection Time: 12/31/15  8:55 AM  Result Value Ref Range   Sodium, Ur 47 mmol/L  Creatinine, urine, random     Status: None   Collection Time: 12/31/15  8:55 AM  Result Value Ref Range   Creatinine, Urine 205.22 mg/dL  Troponin I     Status: None   Collection Time: 12/31/15 10:12 AM  Result Value Ref Range   Troponin I 0.03 <0.031 ng/mL    Comment:        NO INDICATION OF MYOCARDIAL INJURY.   Magnesium     Status: Abnormal   Collection Time: 12/31/15 10:12 AM  Result Value Ref Range   Magnesium 1.4 (L) 1.7 - 2.4 mg/dL  TSH     Status: Abnormal   Collection Time: 12/31/15 10:12 AM  Result Value Ref Range   TSH 6.674 (H) 0.350 - 4.500 uIU/mL    ABGS No results for input(s): PHART, PO2ART, TCO2, HCO3 in the last 72 hours.  Invalid input(s): PCO2 CULTURES Recent Results (from the past 240 hour(s))  MRSA PCR Screening     Status: None   Collection Time: 12/30/15 10:38 PM  Result Value Ref Range Status   MRSA by PCR NEGATIVE NEGATIVE Final    Comment:        The GeneXpert MRSA Assay (FDA approved for NASAL specimens only), is one component of a comprehensive MRSA colonization surveillance program. It is not intended to diagnose MRSA infection nor to guide or monitor treatment for MRSA infections.    Studies/Results: Dg Chest 2 View  12/30/2015  CLINICAL DATA:  Weakness, leg swelling EXAM: CHEST  2 VIEW COMPARISON:  CTA chest dated 07/13/2015 FINDINGS: Cardiomegaly with mild interstitial/perihilar edema. Suspected small right pleural effusion. No pneumothorax. Mild degenerative changes of the visualized thoracolumbar spine. IMPRESSION: Cardiomegaly with mild interstitial edema and suspected small right pleural effusion.  Electronically Signed   By: Julian Hy M.D.   On: 12/30/2015 19:23    Medications: I have reviewed the patient's current medications.  Assesment:   Active Problems:   Atrial fibrillation (HCC)   AKI (acute kidney injury) ([place]) hypertension DJD Hypothyroidism   Plan: Medications reviewed cardiology consult appreciated  Will adjust dose of levothyroxine Will start on magnesium oxide 400 mg po BID Will follow CMP result        Michaela Tyler 01/01/2016, 8:11 AM

## 2016-01-01 NOTE — Progress Notes (Signed)
Pharmacist Heart Failure Core Measure Documentation  Assessment: Michaela Tyler  Rationale: Heart failure patients with left ventricular systolic dysfunction (LVSD) and an EF < 40% should be prescribed an angiotensin converting enzyme inhibitor (ACEI) or angiotensin receptor blocker (ARB) at discharge unless a contraindication is documented in the medical record.  This patient is not currently on an ACEI or ARB for HF.  This note is being placed in the record in order to provide documentation that a contraindication to the use of these agents is present for this encounter.  ACE Inhibitor or Angiotensin Receptor Blocker is contraindicated (specify all that apply)  []   ACEI allergy AND ARB allergy []   Angioedema []   Moderate or severe aortic stenosis []   Hyperkalemia []   Hypotension []   Renal artery stenosis [x]   Worsening renal function, preexisting renal disease or dysfunction   [name], [name] A 01/01/2016 1:49 PM

## 2016-01-02 DIAGNOSIS — Z7982 Long term (current) use of aspirin: Secondary | ICD-10-CM | POA: Diagnosis not present

## 2016-01-02 DIAGNOSIS — I10 Essential (primary) hypertension: Secondary | ICD-10-CM | POA: Diagnosis not present

## 2016-01-02 DIAGNOSIS — Z90710 Acquired absence of both cervix and uterus: Secondary | ICD-10-CM | POA: Diagnosis not present

## 2016-01-02 DIAGNOSIS — I9589 Other hypotension: Secondary | ICD-10-CM | POA: Diagnosis not present

## 2016-01-02 DIAGNOSIS — E7800 Pure hypercholesterolemia, unspecified: Secondary | ICD-10-CM | POA: Diagnosis present

## 2016-01-02 DIAGNOSIS — I5022 Chronic systolic (congestive) heart failure: Secondary | ICD-10-CM | POA: Diagnosis not present

## 2016-01-02 DIAGNOSIS — R488 Other symbolic dysfunctions: Secondary | ICD-10-CM | POA: Diagnosis not present

## 2016-01-02 DIAGNOSIS — Z96652 Presence of left artificial knee joint: Secondary | ICD-10-CM | POA: Diagnosis present

## 2016-01-02 DIAGNOSIS — E785 Hyperlipidemia, unspecified: Secondary | ICD-10-CM | POA: Diagnosis not present

## 2016-01-02 DIAGNOSIS — M7989 Other specified soft tissue disorders: Secondary | ICD-10-CM | POA: Diagnosis present

## 2016-01-02 DIAGNOSIS — I447 Left bundle-branch block, unspecified: Secondary | ICD-10-CM | POA: Diagnosis present

## 2016-01-02 DIAGNOSIS — N189 Chronic kidney disease, unspecified: Secondary | ICD-10-CM | POA: Diagnosis present

## 2016-01-02 DIAGNOSIS — E876 Hypokalemia: Secondary | ICD-10-CM | POA: Diagnosis present

## 2016-01-02 DIAGNOSIS — N178 Other acute kidney failure: Secondary | ICD-10-CM | POA: Diagnosis not present

## 2016-01-02 DIAGNOSIS — I4891 Unspecified atrial fibrillation: Secondary | ICD-10-CM | POA: Diagnosis not present

## 2016-01-02 DIAGNOSIS — M25551 Pain in right hip: Secondary | ICD-10-CM | POA: Diagnosis present

## 2016-01-02 DIAGNOSIS — N179 Acute kidney failure, unspecified: Secondary | ICD-10-CM | POA: Diagnosis not present

## 2016-01-02 DIAGNOSIS — K219 Gastro-esophageal reflux disease without esophagitis: Secondary | ICD-10-CM | POA: Diagnosis not present

## 2016-01-02 DIAGNOSIS — I959 Hypotension, unspecified: Secondary | ICD-10-CM | POA: Diagnosis not present

## 2016-01-02 DIAGNOSIS — E039 Hypothyroidism, unspecified: Secondary | ICD-10-CM | POA: Diagnosis not present

## 2016-01-02 DIAGNOSIS — G8929 Other chronic pain: Secondary | ICD-10-CM | POA: Diagnosis present

## 2016-01-02 DIAGNOSIS — I482 Chronic atrial fibrillation: Secondary | ICD-10-CM | POA: Diagnosis not present

## 2016-01-02 DIAGNOSIS — E669 Obesity, unspecified: Secondary | ICD-10-CM | POA: Diagnosis present

## 2016-01-02 DIAGNOSIS — R7989 Other specified abnormal findings of blood chemistry: Secondary | ICD-10-CM | POA: Diagnosis not present

## 2016-01-02 DIAGNOSIS — R278 Other lack of coordination: Secondary | ICD-10-CM | POA: Diagnosis not present

## 2016-01-02 DIAGNOSIS — M6281 Muscle weakness (generalized): Secondary | ICD-10-CM | POA: Diagnosis not present

## 2016-01-02 DIAGNOSIS — Z6837 Body mass index (BMI) 37.0-37.9, adult: Secondary | ICD-10-CM | POA: Diagnosis not present

## 2016-01-02 DIAGNOSIS — I5033 Acute on chronic diastolic (congestive) heart failure: Secondary | ICD-10-CM | POA: Diagnosis not present

## 2016-01-02 DIAGNOSIS — I130 Hypertensive heart and chronic kidney disease with heart failure and stage 1 through stage 4 chronic kidney disease, or unspecified chronic kidney disease: Secondary | ICD-10-CM | POA: Diagnosis present

## 2016-01-02 DIAGNOSIS — Z853 Personal history of malignant neoplasm of breast: Secondary | ICD-10-CM | POA: Diagnosis not present

## 2016-01-02 DIAGNOSIS — M1990 Unspecified osteoarthritis, unspecified site: Secondary | ICD-10-CM | POA: Diagnosis not present

## 2016-01-02 DIAGNOSIS — Z9012 Acquired absence of left breast and nipple: Secondary | ICD-10-CM | POA: Diagnosis not present

## 2016-01-02 DIAGNOSIS — R296 Repeated falls: Secondary | ICD-10-CM | POA: Diagnosis present

## 2016-01-02 DIAGNOSIS — Z79899 Other long term (current) drug therapy: Secondary | ICD-10-CM | POA: Diagnosis not present

## 2016-01-02 DIAGNOSIS — I5043 Acute on chronic combined systolic (congestive) and diastolic (congestive) heart failure: Secondary | ICD-10-CM | POA: Diagnosis present

## 2016-01-02 DIAGNOSIS — I481 Persistent atrial fibrillation: Secondary | ICD-10-CM | POA: Diagnosis not present

## 2016-01-02 LAB — BASIC METABOLIC PANEL
ANION GAP: 14 (ref 5–15)
BUN: 37 mg/dL — ABNORMAL HIGH (ref 6–20)
CALCIUM: 8.6 mg/dL — AB (ref 8.9–10.3)
CO2: 19 mmol/L — ABNORMAL LOW (ref 22–32)
Chloride: 102 mmol/L (ref 101–111)
Creatinine, Ser: 2.31 mg/dL — ABNORMAL HIGH (ref 0.44–1.00)
GFR, EST AFRICAN AMERICAN: 20 mL/min — AB (ref >60)
GFR, EST NON AFRICAN AMERICAN: 17 mL/min — AB (ref >60)
Glucose, Bld: 69 mg/dL (ref 65–99)
POTASSIUM: 4.1 mmol/L (ref 3.5–5.1)
SODIUM: 135 mmol/L (ref 135–145)

## 2016-01-02 MED ORDER — AMIODARONE HCL 200 MG PO TABS
200.0000 mg | ORAL_TABLET | Freq: Two times a day (BID) | ORAL | Status: DC
  Administered 2016-01-02 – 2016-01-03 (×3): 200 mg via ORAL
  Filled 2016-01-02 (×3): qty 1

## 2016-01-02 NOTE — Progress Notes (Signed)
Patient ID: Michaela Tyler, female   DOB: 01/20/1921, 82 y.o.   MRN: [id number]     Subjective:    Denies any SOB  Objective:   Temp:  [96.4 F (35.8 C)-97.8 F (36.6 C)] 96.4 F (35.8 C) (03/08 0700) Pulse Rate:  [39-76] 64 (03/08 0500) Resp:  [13-27] 17 (03/08 0500) BP: (75-109)/(56-92) 109/74 mmHg (03/08 0500) SpO2:  [89 %-100 %] 99 % (03/08 0500) Weight:  [247 lb 2.2 oz (112.1 kg)] 247 lb 2.2 oz (112.1 kg) (03/08 0500) Last BM Date: 01/01/16  Filed Weights   12/31/15 0500 01/01/16 0500 01/02/16 0500  Weight: 252 lb 13.9 oz (114.7 kg) 251 lb 12.3 oz (114.2 kg) 247 lb 2.2 oz (112.1 kg)    Intake/Output Summary (Last 24 hours) at 01/02/16 0850 Last data filed at 01/02/16 0500  Gross per 24 hour  Intake  320.4 ml  Output      0 ml  Net  320.4 ml    Telemetry: afib, regular rate  Exam:  General: NAD  HEENT: sclera clear, throat clear  Resp: CTAB  Cardiac:irreg, no m/r/g, no jvd  GI: abdomen soft, NT, ND  MSK: 1+bilateral edema  Neuro: no focal deficits  Psych: appropriate affect  Lab Results:  Basic Metabolic Panel:  Recent Labs Lab 12/31/15 0419 12/31/15 1012 01/01/16 0829 01/02/16 0455  NA 135  --  135 135  K 3.4*  --  4.0 4.1  CL 101  --  101 102  CO2 23  --  23 19*  GLUCOSE 97  --  99 69  BUN 35*  --  35* 37*  CREATININE 2.19*  --  2.20* 2.31*  CALCIUM 8.3*  --  8.7* 8.6*  MG  --  1.4*  --   --     Liver Function Tests:  Recent Labs Lab 12/31/15 0419 01/01/16 0829  AST 46* 80*  ALT 38 62*  ALKPHOS 129* 144*  BILITOT 1.8* 2.0*  PROT 5.9* 6.6  ALBUMIN 3.3* 3.6    CBC:  Recent Labs Lab 12/30/15 1830 12/31/15 0419  WBC 5.7 5.3  HGB 12.4 12.1  HCT 38.3 37.2  MCV 94.3 94.4  PLT 154 159    Cardiac Enzymes:  Recent Labs Lab 12/30/15 2311 12/31/15 0419 12/31/15 1012  TROPONINI 0.04* 0.03 0.03    BNP: No results for input(s): PROBNP in the last 8760 hours.  Coagulation: No results for input(s): INR in the  last 168 hours.  ECG:   Medications:   Scheduled Medications: . ALPRAZolam  0.5 mg Oral BID  . aspirin EC  81 mg Oral Daily  . atorvastatin  40 mg Oral Daily  . citalopram  10 mg Oral QHS  . enoxaparin (LOVENOX) injection  30 mg Subcutaneous Q24H  . levothyroxine  125 mcg Oral QAC breakfast  . loratadine  10 mg Oral Daily  . magnesium oxide  400 mg Oral BID  . metoprolol tartrate  37.5 mg Oral Q6H  . pantoprazole  40 mg Oral Daily  . sodium chloride flush  3 mL Intravenous Q12H     Infusions: . amiodarone 30 mg/hr (01/02/16 0600)     PRN Medications:  sodium chloride, acetaminophen **OR** acetaminophen, HYDROcodone-acetaminophen, ondansetron **OR** ondansetron (ZOFRAN) IV, sodium chloride flush     Assessment/Plan    1. Afib with RVR - unable to rate control with lopressor due to soft bp's, was not able to titrate further. Kept on 37.5mg  po q 6 hrs, bps somewhat improved today. -  started on amiodarone yesterday with IV load. Note LFTs were mildly elevated prior to amio 80/62.  - amio stopped early this AM due to low heart rates. She remains in afib however rates much better controlled than previous. We will start amio  bid. - she has not been on anticoag historically due to frequent falls  2. Acute on chronic diastolic HF - 12/2015 echo LVEF 45-50%, cannot determine diastolic function but severe biatrial enlargement suggets, dilated IVC suggesting elevated RA pressure and hypervolemia - collection of data suggests she is volume overloaded based on repeat CXR yesterday, exam, and dilated IVC on echo. She has an AKI and Fe Urea is <35%, the only date against her being volume overloaded. Dosed with IV lasix once yesterday, outputs not recorded. Volume status appears improved by exam. Uptrend in Cr and BUN, will hold on diuretics today.    Recommend monitoring at least one more day now changed to oral amio. A regular bed would be ok.     Dina Rich, M.D.

## 2016-01-02 NOTE — Progress Notes (Signed)
Patient was unable to void this am per RN report at 1230. MD notified and ordered to place foley catheter. Patient wanted to wait to place foley at that time. At 1430, patient was able to void 400cc of clear, yellow urine in bedpan. Will hold off on foley at this time.

## 2016-01-02 NOTE — Progress Notes (Signed)
Subjective: Patient is resting. She is on amiodrone drip.  Her heart rate is being controlled. Her EF is around 45%. Her renal function remained elevated. Patient is off diuretics.  Objective: Vital signs in last 24 hours: Temp:  [96.4 F (35.8 C)-97.8 F (36.6 C)] 96.7 F (35.9 C) (03/08 0400) Pulse Rate:  [39-76] 64 (03/08 0500) Resp:  [13-27] 17 (03/08 0500) BP: (75-109)/(56-92) 109/74 mmHg (03/08 0500) SpO2:  [89 %-100 %] 99 % (03/08 0500) Weight:  [112.1 kg (247 lb 2.2 oz)] 112.1 kg (247 lb 2.2 oz) (03/08 0500) Weight change: -2.1 kg (-4 lb 10.1 oz) Last BM Date: 01/01/16  Intake/Output from previous day: 03/07 0701 - 03/08 0700 In: 320.4 [P.O.:120; I.V.:200.4] Out: -   PHYSICAL EXAM General appearance: alert, cooperative and moderately obese Resp: diminished breath sounds bilaterally and rhonchi bilaterally Cardio: regularly irregular rhythm GI: soft, non-tender; bowel sounds normal; no masses,  no organomegaly Extremities: edema 3+++  Lab Results:  Results for orders placed or performed during the hospital encounter of 12/30/15 (from the past 48 hour(s))  Urinalysis, Routine w reflex microscopic     Status: None   Collection Time: 12/31/15  8:55 AM  Result Value Ref Range   Color, Urine YELLOW YELLOW   APPearance CLEAR CLEAR   Specific Gravity, Urine 1.020 1.005 - 1.030   pH 5.5 5.0 - 8.0   Glucose, UA NEGATIVE NEGATIVE mg/dL   Hgb urine dipstick NEGATIVE NEGATIVE   Bilirubin Urine NEGATIVE NEGATIVE   Ketones, ur NEGATIVE NEGATIVE mg/dL   Protein, ur NEGATIVE NEGATIVE mg/dL   Nitrite NEGATIVE NEGATIVE   Leukocytes, UA NEGATIVE NEGATIVE    Comment: MICROSCOPIC NOT DONE ON URINES WITH NEGATIVE PROTEIN, BLOOD, LEUKOCYTES, NITRITE, OR GLUCOSE <1000 mg/dL.  Urine culture     Status: None   Collection Time: 12/31/15  8:55 AM  Result Value Ref Range   Specimen Description URINE, CLEAN CATCH    Special Requests NONE    Culture      NO GROWTH 1 DAY Performed at  [facility]    Report Status 01/01/2016 FINAL   Sodium, urine, random     Status: None   Collection Time: 12/31/15  8:55 AM  Result Value Ref Range   Sodium, Ur 47 mmol/L  Creatinine, urine, random     Status: None   Collection Time: 12/31/15  8:55 AM  Result Value Ref Range   Creatinine, Urine 205.22 mg/dL  Urea nitrogen, urine     Status: None   Collection Time: 12/31/15  8:55 AM  Result Value Ref Range   Urea Nitrogen, Ur 589 Not Estab. mg/dL    Comment: (NOTE) Performed At: [facility] [address] [place], [place] [id number] [place] F MD Ph:[phone]   Troponin I     Status: None   Collection Time: 12/31/15 10:12 AM  Result Value Ref Range   Troponin I 0.03 <0.031 ng/mL    Comment:        NO INDICATION OF MYOCARDIAL INJURY.   Magnesium     Status: Abnormal   Collection Time: 12/31/15 10:12 AM  Result Value Ref Range   Magnesium 1.4 (L) 1.7 - 2.4 mg/dL  TSH     Status: Abnormal   Collection Time: 12/31/15 10:12 AM  Result Value Ref Range   TSH 6.674 (H) 0.350 - 4.500 uIU/mL  Comprehensive metabolic panel     Status: Abnormal   Collection Time: 01/01/16  8:29 AM  Result Value Ref Range     Sodium 135 135 - 145 mmol/L   Potassium 4.0 3.5 - 5.1 mmol/L   Chloride 101 101 - 111 mmol/L   CO2 23 22 - 32 mmol/L   Glucose, Bld 99 65 - 99 mg/dL   BUN 35 (H) 6 - 20 mg/dL   Creatinine, Ser 2.20 (H) 0.44 - 1.00 mg/dL   Calcium 8.7 (L) 8.9 - 10.3 mg/dL   Total Protein 6.6 6.5 - 8.1 g/dL   Albumin 3.6 3.5 - 5.0 g/dL   AST 80 (H) 15 - 41 U/L   ALT 62 (H) 14 - 54 U/L   Alkaline Phosphatase 144 (H) 38 - 126 U/L   Total Bilirubin 2.0 (H) 0.3 - 1.2 mg/dL   GFR calc non Af Amer 18 (L) >60 mL/min   GFR calc Af Amer 21 (L) >60 mL/min    Comment: (NOTE) The eGFR has been calculated using the CKD EPI equation. This calculation has not been validated in all clinical situations. eGFR's persistently <60 mL/min signify possible Chronic Kidney Disease.     Anion gap 11 5 - 15  Basic metabolic panel     Status: Abnormal   Collection Time: 01/02/16  4:55 AM  Result Value Ref Range   Sodium 135 135 - 145 mmol/L   Potassium 4.1 3.5 - 5.1 mmol/L   Chloride 102 101 - 111 mmol/L   CO2 19 (L) 22 - 32 mmol/L   Glucose, Bld 69 65 - 99 mg/dL   BUN 37 (H) 6 - 20 mg/dL   Creatinine, Ser 2.31 (H) 0.44 - 1.00 mg/dL   Calcium 8.6 (L) 8.9 - 10.3 mg/dL   GFR calc non Af Amer 17 (L) >60 mL/min   GFR calc Af Amer 20 (L) >60 mL/min    Comment: (NOTE) The eGFR has been calculated using the CKD EPI equation. This calculation has not been validated in all clinical situations. eGFR's persistently <60 mL/min signify possible Chronic Kidney Disease.    Anion gap 14 5 - 15    ABGS No results for input(s): PHART, PO2ART, TCO2, HCO3 in the last 72 hours.  Invalid input(s): PCO2 CULTURES Recent Results (from the past 240 hour(s))  MRSA PCR Screening     Status: None   Collection Time: 12/30/15 10:38 PM  Result Value Ref Range Status   MRSA by PCR NEGATIVE NEGATIVE Final    Comment:        The GeneXpert MRSA Assay (FDA approved for NASAL specimens only), is one component of a comprehensive MRSA colonization surveillance program. It is not intended to diagnose MRSA infection nor to guide or monitor treatment for MRSA infections.   Urine culture     Status: None   Collection Time: 12/31/15  8:55 AM  Result Value Ref Range Status   Specimen Description URINE, CLEAN CATCH  Final   Special Requests NONE  Final   Culture   Final    NO GROWTH 1 DAY Performed at [facility]    Report Status 01/01/2016 FINAL  Final   Studies/Results: Dg Chest Port 1 View  01/01/2016  CLINICAL DATA:  82-year-old female with weakness and shortness of breath. EXAM: PORTABLE CHEST 1 VIEW COMPARISON:  12/30/2015 and prior exams FINDINGS: This is a mildly low volume film. Cardiomegaly and pulmonary vascular congestion again noted. Elevation of the right hemidiaphragm  is unchanged. There is no evidence of focal airspace disease, pulmonary edema, suspicious pulmonary nodule/mass, pleural effusion, or pneumothorax. No acute bony abnormalities are identified. IMPRESSION: Low   volume film with cardiomegaly and pulmonary vascular congestion. Electronically Signed   By: Margarette Canada M.D.   On: 01/01/2016 11:11    Medications: I have reviewed the patient's current medications.  Assesment:   Active Problems:   Atrial fibrillation (HCC)   AKI (acute kidney injury) ([place]) hypertension DJD Hypothyroidism Acute on Chronic systolic CHF   Plan: Medications reviewed Will continue current treatment as per cardiology recommendation Will monitor CMP        Ganon Demasi 01/02/2016, 7:58 AM

## 2016-01-03 ENCOUNTER — Inpatient Hospital Stay (HOSPITAL_COMMUNITY): Payer: Medicare Other

## 2016-01-03 DIAGNOSIS — R7989 Other specified abnormal findings of blood chemistry: Secondary | ICD-10-CM

## 2016-01-03 LAB — COMPREHENSIVE METABOLIC PANEL
ALBUMIN: 2.9 g/dL — AB (ref 3.5–5.0)
ALK PHOS: 139 U/L — AB (ref 38–126)
ALT: 105 U/L — AB (ref 14–54)
AST: 127 U/L — AB (ref 15–41)
Anion gap: 10 (ref 5–15)
BILIRUBIN TOTAL: 1.3 mg/dL — AB (ref 0.3–1.2)
BUN: 36 mg/dL — AB (ref 6–20)
CALCIUM: 8.4 mg/dL — AB (ref 8.9–10.3)
CO2: 23 mmol/L (ref 22–32)
CREATININE: 2.33 mg/dL — AB (ref 0.44–1.00)
Chloride: 101 mmol/L (ref 101–111)
GFR calc Af Amer: 20 mL/min — ABNORMAL LOW (ref >60)
GFR calc non Af Amer: 17 mL/min — ABNORMAL LOW (ref >60)
GLUCOSE: 93 mg/dL (ref 65–99)
POTASSIUM: 3.7 mmol/L (ref 3.5–5.1)
Sodium: 134 mmol/L — ABNORMAL LOW (ref 135–145)
TOTAL PROTEIN: 5.7 g/dL — AB (ref 6.5–8.1)

## 2016-01-03 LAB — T4, FREE: Free T4: 1.49 ng/dL — ABNORMAL HIGH (ref 0.61–1.12)

## 2016-01-03 MED ORDER — AMIODARONE HCL IN DEXTROSE 360-4.14 MG/200ML-% IV SOLN
INTRAVENOUS | Status: AC
  Filled 2016-01-03: qty 200

## 2016-01-03 MED ORDER — SODIUM CHLORIDE 0.9 % IV BOLUS (SEPSIS)
500.0000 mL | Freq: Once | INTRAVENOUS | Status: AC
  Administered 2016-01-03: 500 mL via INTRAVENOUS

## 2016-01-03 MED ORDER — METOPROLOL TARTRATE 50 MG PO TABS
50.0000 mg | ORAL_TABLET | Freq: Two times a day (BID) | ORAL | Status: DC
  Administered 2016-01-03 – 2016-01-08 (×10): 50 mg via ORAL
  Filled 2016-01-03 (×11): qty 1

## 2016-01-03 MED ORDER — AMIODARONE HCL IN DEXTROSE 360-4.14 MG/200ML-% IV SOLN: 30.0000 mg/h | INTRAVENOUS | Status: DC

## 2016-01-03 MED ORDER — AMIODARONE HCL IN DEXTROSE 360-4.14 MG/200ML-% IV SOLN
30.0000 mg/h | INTRAVENOUS | Status: DC
  Administered 2016-01-03: 30 mg/h via INTRAVENOUS

## 2016-01-03 NOTE — Progress Notes (Signed)
PT Cancellation Note  Patient Details Name: Michaela Tyler M Arora MRN: [id number] DOB: [birth year]   Cancelled Treatment:    Reason Eval/Treat Not Completed: Patient not medically ready. Chart reviewed, RN consulted. Holding pt treatment at this time due to recent instability of HR/BP. RN reports meds are being changed in attempts to better stabilize vitals. Will attempt again at later date/time.     9:38 AM, 01/03/2016 Rosamaria LintsAllan C Amera Banos, PT, DPT PRN [facility] The[facility] License # [id number] [phone] (wireless)  [phone] (mobile)

## 2016-01-03 NOTE — Progress Notes (Signed)
Soft bp's this afternoon into 80s, remains in afib with RVR. Given 500mL NS bolus with increase SBP to 98. Her fluid status has been puzzling as some data suggests overload and some suggests she is dry. Bp's have been slowly trending down, BUN and Cr pattern suggestive of prerenal, BP did improve with some NS. Regarding afib with RVR she has not been able to get PO lopressor due to low bp's, will start back low dose amio at 0.5mg /min and see how tolerated, previously she did not complete her 24 hour load due to bradycardia after more aggressive loading. Perhaps her edema is more related to hypoalbumnemia and related 3rd spacing as opposed to total volume overload. Echo with no findings to support hypotension, she has not shown signs of sepsis, perhaps she is intravascularly depleted. Consider gentle IVFs and following of her bp's and renal function.    Dominga FerryJ Branch MD

## 2016-01-03 NOTE — Consult Note (Signed)
   [facility] Consult   01/03/2016  Michaela LeakGarnett M Tyler [birth year] [id number]  Spoke briefly with patient at bedside regarding [facility]. Patient spouse not present at this time, RNCM left brochure for reference. Will attempt to come back later to see if spouse available to further discuss [facility]. Of note, [facility] services would not replace or interfere with any services that are arranged by inpatient case management or social work. For additional questions or referrals please contact:  Alben SpittleMary E. Albertha GheeNiemczura, RN, BSN, [facility]  [facility] Hospital Liaison [phone]

## 2016-01-03 NOTE — Progress Notes (Addendum)
Discussed low BP with Dr. Wyline MoodBranch. 2nd dose metoprolol being held. Will give amiodarone, but hold metoprolol. No transfer to floor for today due to BP

## 2016-01-03 NOTE — Progress Notes (Signed)
Subjective: Patient is off amiodarone drip. Her heart rate is being controlled with betablocker. Not much changed in her renal functions. Patient having difficulty ambulating well. Objective: Vital signs in last 24 hours: Temp:  [97 F (36.1 C)-98.2 F (36.8 C)] 98.2 F (36.8 C) (03/09 0400) Pulse Rate:  [57-126] 116 (03/09 0500) Resp:  [16-28] 16 (03/09 0500) BP: (74-105)/(54-84) 86/62 mmHg (03/09 0330) SpO2:  [91 %-100 %] 98 % (03/09 0500) Weight:  [112 kg (246 lb 14.6 oz)] 112 kg (246 lb 14.6 oz) (03/09 0500) Weight change: -0.1 kg (-3.5 oz) Last BM Date: 01/02/16  Intake/Output from previous day: 03/08 0701 - 03/09 0700 In: 480 [P.O.:480] Out: 1000 [Urine:1000]  PHYSICAL EXAM General appearance: alert, cooperative and moderately obese Resp: diminished breath sounds bilaterally and rhonchi bilaterally Cardio: regularly irregular rhythm GI: soft, non-tender; bowel sounds normal; no masses,  no organomegaly Extremities: edema 3+++  Lab Results:  Results for orders placed or performed during the hospital encounter of 12/30/15 (from the past 48 hour(s))  Comprehensive metabolic panel     Status: Abnormal   Collection Time: 01/01/16  8:29 AM  Result Value Ref Range   Sodium 135 135 - 145 mmol/L   Potassium 4.0 3.5 - 5.1 mmol/L   Chloride 101 101 - 111 mmol/L   CO2 23 22 - 32 mmol/L   Glucose, Bld 99 65 - 99 mg/dL   BUN 35 (H) 6 - 20 mg/dL   Creatinine, Ser 2.20 (H) 0.44 - 1.00 mg/dL   Calcium 8.7 (L) 8.9 - 10.3 mg/dL   Total Protein 6.6 6.5 - 8.1 g/dL   Albumin 3.6 3.5 - 5.0 g/dL   AST 80 (H) 15 - 41 U/L   ALT 62 (H) 14 - 54 U/L   Alkaline Phosphatase 144 (H) 38 - 126 U/L   Total Bilirubin 2.0 (H) 0.3 - 1.2 mg/dL   GFR calc non Af Amer 18 (L) >60 mL/min   GFR calc Af Amer 21 (L) >60 mL/min    Comment: (NOTE) The eGFR has been calculated using the CKD EPI equation. This calculation has not been validated in all clinical situations. eGFR's persistently <60 mL/min  signify possible Chronic Kidney Disease.    Anion gap 11 5 - 15  Basic metabolic panel     Status: Abnormal   Collection Time: 01/02/16  4:55 AM  Result Value Ref Range   Sodium 135 135 - 145 mmol/L   Potassium 4.1 3.5 - 5.1 mmol/L   Chloride 102 101 - 111 mmol/L   CO2 19 (L) 22 - 32 mmol/L   Glucose, Bld 69 65 - 99 mg/dL   BUN 37 (H) 6 - 20 mg/dL   Creatinine, Ser 2.31 (H) 0.44 - 1.00 mg/dL   Calcium 8.6 (L) 8.9 - 10.3 mg/dL   GFR calc non Af Amer 17 (L) >60 mL/min   GFR calc Af Amer 20 (L) >60 mL/min    Comment: (NOTE) The eGFR has been calculated using the CKD EPI equation. This calculation has not been validated in all clinical situations. eGFR's persistently <60 mL/min signify possible Chronic Kidney Disease.    Anion gap 14 5 - 15  Comprehensive metabolic panel     Status: Abnormal   Collection Time: 01/03/16  4:40 AM  Result Value Ref Range   Sodium 134 (L) 135 - 145 mmol/L   Potassium 3.7 3.5 - 5.1 mmol/L   Chloride 101 101 - 111 mmol/L   CO2 23 22 - 32 mmol/L  Glucose, Bld 93 65 - 99 mg/dL   BUN 36 (H) 6 - 20 mg/dL   Creatinine, Ser 2.33 (H) 0.44 - 1.00 mg/dL   Calcium 8.4 (L) 8.9 - 10.3 mg/dL   Total Protein 5.7 (L) 6.5 - 8.1 g/dL   Albumin 2.9 (L) 3.5 - 5.0 g/dL   AST 127 (H) 15 - 41 U/L   ALT 105 (H) 14 - 54 U/L   Alkaline Phosphatase 139 (H) 38 - 126 U/L   Total Bilirubin 1.3 (H) 0.3 - 1.2 mg/dL   GFR calc non Af Amer 17 (L) >60 mL/min   GFR calc Af Amer 20 (L) >60 mL/min    Comment: (NOTE) The eGFR has been calculated using the CKD EPI equation. This calculation has not been validated in all clinical situations. eGFR's persistently <60 mL/min signify possible Chronic Kidney Disease.    Anion gap 10 5 - 15    ABGS No results for input(s): PHART, PO2ART, TCO2, HCO3 in the last 72 hours.  Invalid input(s): PCO2 CULTURES Recent Results (from the past 240 hour(s))  MRSA PCR Screening     Status: None   Collection Time: 12/30/15 10:38 PM  Result  Value Ref Range Status   MRSA by PCR NEGATIVE NEGATIVE Final    Comment:        The GeneXpert MRSA Assay (FDA approved for NASAL specimens only), is one component of a comprehensive MRSA colonization surveillance program. It is not intended to diagnose MRSA infection nor to guide or monitor treatment for MRSA infections.   Urine culture     Status: None   Collection Time: 12/31/15  8:55 AM  Result Value Ref Range Status   Specimen Description URINE, CLEAN CATCH  Final   Special Requests NONE  Final   Culture   Final    NO GROWTH 1 DAY Performed at [facility]    Report Status 01/01/2016 FINAL  Final   Studies/Results: Dg Chest Port 1 View  01/01/2016  CLINICAL DATA:  82 year old female with weakness and shortness of breath. EXAM: PORTABLE CHEST 1 VIEW COMPARISON:  12/30/2015 and prior exams FINDINGS: This is a mildly low volume film. Cardiomegaly and pulmonary vascular congestion again noted. Elevation of the right hemidiaphragm is unchanged. There is no evidence of focal airspace disease, pulmonary edema, suspicious pulmonary nodule/mass, pleural effusion, or pneumothorax. No acute bony abnormalities are identified. IMPRESSION: Low volume film with cardiomegaly and pulmonary vascular congestion. Electronically Signed   By: Margarette Canada M.D.   On: 01/01/2016 11:11    Medications: I have reviewed the patient's current medications.  Assesment:   Active Problems:   Atrial fibrillation (HCC)   AKI (acute kidney injury) ([place]) hypertension DJD Hypothyroidism Acute on Chronic systolic CHF   Plan: Medications reviewed Will continue current treatment as per cardiology recommendation Will monitor CMP Will do physical therapy evaluation      LOS: 1 day   Dresden Lozito 01/03/2016, 8:13 AM

## 2016-01-03 NOTE — Progress Notes (Signed)
Patient ID: Michaela Tyler, female   DOB: 1921-01-29, 82 y.o.   MRN: [id number]     Subjective:    No complaints this morning.   Objective:   Temp:  [96.1 F (35.6 C)-98.2 F (36.8 C)] 96.1 F (35.6 C) (03/09 0835) Pulse Rate:  [57-126] 116 (03/09 0500) Resp:  [16-28] 16 (03/09 0500) BP: (74-105)/(54-84) 86/62 mmHg (03/09 0330) SpO2:  [91 %-100 %] 98 % (03/09 0500) Weight:  [246 lb 14.6 oz (112 kg)] 246 lb 14.6 oz (112 kg) (03/09 0500) Last BM Date: 01/02/16  Filed Weights   01/01/16 0500 01/02/16 0500 01/03/16 0500  Weight: 251 lb 12.3 oz (114.2 kg) 247 lb 2.2 oz (112.1 kg) 246 lb 14.6 oz (112 kg)    Intake/Output Summary (Last 24 hours) at 01/03/16 0851 Last data filed at 01/03/16 0500  Gross per 24 hour  Intake    240 ml  Output   1000 ml  Net   -760 ml    Telemetry: afib rates 80-110s  Exam:  General: NAD  HEENT: sclera clear, throat clear  Resp: faint crackles bilateral bases  Cardiac: irreg, no m/r/g, no jvd  GI: abdomen soft, NT, ND  MSK: 1+ bilateral LE edema  Neuro: no focal deficits  Psych: appropriate affect  Lab Results:  Basic Metabolic Panel:  Recent Labs Lab 12/31/15 1012 01/01/16 0829 01/02/16 0455 01/03/16 0440  NA  --  135 135 134*  K  --  4.0 4.1 3.7  CL  --  101 102 101  CO2  --  23 19* 23  GLUCOSE  --  99 69 93  BUN  --  35* 37* 36*  CREATININE  --  2.20* 2.31* 2.33*  CALCIUM  --  8.7* 8.6* 8.4*  MG 1.4*  --   --   --     Liver Function Tests:  Recent Labs Lab 12/31/15 0419 01/01/16 0829 01/03/16 0440  AST 46* 80* 127*  ALT 38 62* 105*  ALKPHOS 129* 144* 139*  BILITOT 1.8* 2.0* 1.3*  PROT 5.9* 6.6 5.7*  ALBUMIN 3.3* 3.6 2.9*    CBC:  Recent Labs Lab 12/30/15 1830 12/31/15 0419  WBC 5.7 5.3  HGB 12.4 12.1  HCT 38.3 37.2  MCV 94.3 94.4  PLT 154 159    Cardiac Enzymes:  Recent Labs Lab 12/30/15 2311 12/31/15 0419 12/31/15 1012  TROPONINI 0.04* 0.03 0.03    BNP: No results for input(s):  PROBNP in the last 8760 hours.  Coagulation: No results for input(s): INR in the last 168 hours.  ECG:   Medications:   Scheduled Medications: . ALPRAZolam  0.5 mg Oral BID  . amiodarone  200 mg Oral BID  . aspirin EC  81 mg Oral Daily  . atorvastatin  40 mg Oral Daily  . citalopram  10 mg Oral QHS  . enoxaparin (LOVENOX) injection  30 mg Subcutaneous Q24H  . levothyroxine  125 mcg Oral QAC breakfast  . loratadine  10 mg Oral Daily  . magnesium oxide  400 mg Oral BID  . metoprolol tartrate  37.5 mg Oral Q6H  . pantoprazole  40 mg Oral Daily  . sodium chloride flush  3 mL Intravenous Q12H     Infusions:     PRN Medications:  sodium chloride, acetaminophen **OR** acetaminophen, HYDROcodone-acetaminophen, ondansetron **OR** ondansetron (ZOFRAN) IV, sodium chloride flush     Assessment/Plan    1. Afib with RVR - unable to rate control with lopressor due to soft  bp's, was not able to titrate further past 37.8m q6 hrs.  - started on amiodarone yesterday with IV load. Note LFTs were mildly elevated prior to amio AST 80 and ALT 62, this has trended up slightly today, needs to be monitored closely on amio. Note her baseline TSH was also elevated at 6.6, free T4 and T3 pending.  - IV amio stopped early this AM due to low heart rates, changed to oral 2036mbid yesterday. She remains in afib however rates much better controlled than previous. If remains in afib on oral amio load and LFTs continue to trend up will have to stop amio. With structural heart disease and renal dysfunction not a lot of good alternative antiarrhythmic options, especially if amio is ineffective.  - she has not been on anticoag historically due to frequent falls. Would not consider DCCV if cannot committ long term to anticoag - will change lopressor 37.30m30m6 hrs to 109m71md today  2. Acute on chronic diastolic HF - 3/200/9906o LVEF 45-50%, cannot determine diastolic function but severe biatrial enlargement  suggests diastolic dysfunction, dilated IVC suggesting elevated RA pressure and hypervolemia - collection of data suggests she is volume overloaded based on repeat CXR yesterday, exam, and dilated IVC on echo. She has an AKI and Fe Urea is <35%, the only date against her being volume overloaded. Dosed with IV lasix and volume status appears improved, could not be aggressive with diuresis due to uptrend in Cr.    3. Elevated LFTs - will check abd US, [place]evated AST/ALT as well as Alk phos and t.bili.  - labs were mildly elevated prior to starting amio, trending up. Continue to follow trend, f/u US. [place] 4. AKI - per primary team, abd US t[place]oreaay for elevated LFTs will also evaluate kidneys   JonaCarlyle DollyD.

## 2016-01-03 NOTE — Progress Notes (Signed)
Patients blood pressure continues to be low systolic. Manual BP 90/68. Heart rate increasing averaging around 115. Dr. Wyline MoodBranch notified. He will evaluate.

## 2016-01-04 ENCOUNTER — Inpatient Hospital Stay (HOSPITAL_COMMUNITY): Payer: Medicare Other

## 2016-01-04 DIAGNOSIS — I4891 Unspecified atrial fibrillation: Principal | ICD-10-CM

## 2016-01-04 DIAGNOSIS — I9589 Other hypotension: Secondary | ICD-10-CM

## 2016-01-04 LAB — COMPREHENSIVE METABOLIC PANEL
ALBUMIN: 2.9 g/dL — AB (ref 3.5–5.0)
ALT: 119 U/L — ABNORMAL HIGH (ref 14–54)
ANION GAP: 9 (ref 5–15)
AST: 134 U/L — AB (ref 15–41)
Alkaline Phosphatase: 133 U/L — ABNORMAL HIGH (ref 38–126)
BUN: 39 mg/dL — ABNORMAL HIGH (ref 6–20)
CHLORIDE: 102 mmol/L (ref 101–111)
CO2: 22 mmol/L (ref 22–32)
Calcium: 8.4 mg/dL — ABNORMAL LOW (ref 8.9–10.3)
Creatinine, Ser: 2.5 mg/dL — ABNORMAL HIGH (ref 0.44–1.00)
GFR calc Af Amer: 18 mL/min — ABNORMAL LOW (ref >60)
GFR calc non Af Amer: 16 mL/min — ABNORMAL LOW (ref >60)
GLUCOSE: 105 mg/dL — AB (ref 65–99)
POTASSIUM: 3.9 mmol/L (ref 3.5–5.1)
Sodium: 133 mmol/L — ABNORMAL LOW (ref 135–145)
Total Bilirubin: 1.1 mg/dL (ref 0.3–1.2)
Total Protein: 5.5 g/dL — ABNORMAL LOW (ref 6.5–8.1)

## 2016-01-04 LAB — CBC
HEMATOCRIT: 37.8 % (ref 36.0–46.0)
HEMOGLOBIN: 12.2 g/dL (ref 12.0–15.0)
MCH: 30.3 pg (ref 26.0–34.0)
MCHC: 32.3 g/dL (ref 30.0–36.0)
MCV: 93.8 fL (ref 78.0–100.0)
Platelets: 158 10*3/uL (ref 150–400)
RBC: 4.03 MIL/uL (ref 3.87–5.11)
RDW: 17.4 % — AB (ref 11.5–15.5)
WBC: 6.9 10*3/uL (ref 4.0–10.5)

## 2016-01-04 LAB — T3: T3 TOTAL: 41 ng/dL — AB (ref 71–180)

## 2016-01-04 LAB — CORTISOL-AM, BLOOD: Cortisol - AM: 21.7 ug/dL (ref 6.7–22.6)

## 2016-01-04 MED ORDER — SODIUM CHLORIDE 0.9 % IV BOLUS (SEPSIS)
250.0000 mL | Freq: Once | INTRAVENOUS | Status: AC
  Administered 2016-01-04: 250 mL via INTRAVENOUS

## 2016-01-04 MED ORDER — ALBUMIN HUMAN 25 % IV SOLN
12.5000 g | Freq: Once | INTRAVENOUS | Status: DC
Start: 2016-01-04 — End: 2016-01-04
  Filled 2016-01-04: qty 250

## 2016-01-04 MED ORDER — SODIUM CHLORIDE 0.45 % IV SOLN
INTRAVENOUS | Status: DC
Start: 2016-01-04 — End: 2016-01-07
  Administered 2016-01-04 – 2016-01-07 (×4): via INTRAVENOUS

## 2016-01-04 NOTE — Progress Notes (Signed)
Subjective: Patient remained in [place]. Her blood pressure is on the lower side and unable to take  Her betablocker. He pulse is running around 90-120/min. Her LFT remained persistently high.. Objective: Vital signs in last 24 hours: Temp:  [96.1 F (35.6 C)-97.9 F (36.6 C)] 97.9 F (36.6 C) (03/10 0400) Pulse Rate:  [32-136] 47 (03/10 0650) Resp:  [13-25] 21 (03/10 0650) BP: (53-124)/(31-100) 89/64 mmHg (03/10 0650) SpO2:  [85 %-100 %] 89 % (03/10 0650) Weight:  [116.6 kg (257 lb 0.9 oz)] 116.6 kg (257 lb 0.9 oz) (03/10 0500) Weight change: 4.6 kg (10 lb 2.3 oz) Last BM Date: 01/03/16  Intake/Output from previous day: 03/09 0701 - 03/10 0700 In: 1265 [P.O.:720; I.V.:45; IV Piggyback:500] Out: 700 [Urine:700]  PHYSICAL EXAM General appearance: alert, cooperative and moderately obese Resp: diminished breath sounds bilaterally and rhonchi bilaterally Cardio: regularly irregular rhythm GI: soft, non-tender; bowel sounds normal; no masses,  no organomegaly Extremities: edema 3+++  Lab Results:  Results for orders placed or performed during the hospital encounter of 12/30/15 (from the past 48 hour(s))  Comprehensive metabolic panel     Status: Abnormal   Collection Time: 01/03/16  4:40 AM  Result Value Ref Range   Sodium 134 (L) 135 - 145 mmol/L   Potassium 3.7 3.5 - 5.1 mmol/L   Chloride 101 101 - 111 mmol/L   CO2 23 22 - 32 mmol/L   Glucose, Bld 93 65 - 99 mg/dL   BUN 36 (H) 6 - 20 mg/dL   Creatinine, Ser 2.33 (H) 0.44 - 1.00 mg/dL   Calcium 8.4 (L) 8.9 - 10.3 mg/dL   Total Protein 5.7 (L) 6.5 - 8.1 g/dL   Albumin 2.9 (L) 3.5 - 5.0 g/dL   AST 127 (H) 15 - 41 U/L   ALT 105 (H) 14 - 54 U/L   Alkaline Phosphatase 139 (H) 38 - 126 U/L   Total Bilirubin 1.3 (H) 0.3 - 1.2 mg/dL   GFR calc non Af Amer 17 (L) >60 mL/min   GFR calc Af Amer 20 (L) >60 mL/min    Comment: (NOTE) The eGFR has been calculated using the CKD EPI equation. This calculation has not been validated in all  clinical situations. eGFR's persistently <60 mL/min signify possible Chronic Kidney Disease.    Anion gap 10 5 - 15  T4, free     Status: Abnormal   Collection Time: 01/03/16 10:00 AM  Result Value Ref Range   Free T4 1.49 (H) 0.61 - 1.12 ng/dL    Comment: Performed at [facility]  T3     Status: Abnormal   Collection Time: 01/03/16 10:00 AM  Result Value Ref Range   T3, Total 41 (L) 71 - 180 ng/dL    Comment: (NOTE) Performed At: [facility] [place], [place] [id number] Lindon Romp MD ZR:[id number]   Comprehensive metabolic panel     Status: Abnormal   Collection Time: 01/04/16  4:32 AM  Result Value Ref Range   Sodium 133 (L) 135 - 145 mmol/L   Potassium 3.9 3.5 - 5.1 mmol/L   Chloride 102 101 - 111 mmol/L   CO2 22 22 - 32 mmol/L   Glucose, Bld 105 (H) 65 - 99 mg/dL   BUN 39 (H) 6 - 20 mg/dL   Creatinine, Ser 2.50 (H) 0.44 - 1.00 mg/dL   Calcium 8.4 (L) 8.9 - 10.3 mg/dL   Total Protein 5.5 (L) 6.5 - 8.1 g/dL   Albumin 2.9 (  L) 3.5 - 5.0 g/dL   AST 134 (H) 15 - 41 U/L   ALT 119 (H) 14 - 54 U/L   Alkaline Phosphatase 133 (H) 38 - 126 U/L   Total Bilirubin 1.1 0.3 - 1.2 mg/dL   GFR calc non Af Amer 16 (L) >60 mL/min   GFR calc Af Amer 18 (L) >60 mL/min    Comment: (NOTE) The eGFR has been calculated using the CKD EPI equation. This calculation has not been validated in all clinical situations. eGFR's persistently <60 mL/min signify possible Chronic Kidney Disease.    Anion gap 9 5 - 15  CBC     Status: Abnormal   Collection Time: 01/04/16  4:32 AM  Result Value Ref Range   WBC 6.9 4.0 - 10.5 K/uL   RBC 4.03 3.87 - 5.11 MIL/uL   Hemoglobin 12.2 12.0 - 15.0 g/dL   HCT 37.8 36.0 - 46.0 %   MCV 93.8 78.0 - 100.0 fL   MCH 30.3 26.0 - 34.0 pg   MCHC 32.3 30.0 - 36.0 g/dL   RDW 17.4 (H) 11.5 - 15.5 %   Platelets 158 150 - 400 K/uL    ABGS No results for input(s): PHART, PO2ART, TCO2, HCO3 in the last 72 hours.  Invalid input(s):  PCO2 CULTURES Recent Results (from the past 240 hour(s))  MRSA PCR Screening     Status: None   Collection Time: 12/30/15 10:38 PM  Result Value Ref Range Status   MRSA by PCR NEGATIVE NEGATIVE Final    Comment:        The GeneXpert MRSA Assay (FDA approved for NASAL specimens only), is one component of a comprehensive MRSA colonization surveillance program. It is not intended to diagnose MRSA infection nor to guide or monitor treatment for MRSA infections.   Urine culture     Status: None   Collection Time: 12/31/15  8:55 AM  Result Value Ref Range Status   Specimen Description URINE, CLEAN CATCH  Final   Special Requests NONE  Final   Culture   Final    NO GROWTH 1 DAY Performed at [facility]    Report Status 01/01/2016 FINAL  Final   Studies/Results: No results found.  Medications: I have reviewed the patient's current medications.  Assesment:   Active Problems:   Atrial fibrillation (HCC)   AKI (acute kidney injury) ([place]) hypertension DJD Hypothyroidism Acute on Chronic systolic CHF Abnormal LFT  Plan: Medications reviewed Will continue current treatment as per cardiology recommendation Will monitor CMP GI Consult      LOS: 2 days   Victorhugo Preis 01/04/2016, 8:18 AM

## 2016-01-04 NOTE — Clinical Social Work Note (Signed)
CSW notes recommendation for SNF. CSW will leave report so that next CSW can followup with patient and family regarding this need.   Roddie McBryant Rosibel Giacobbe MSW, [place]CS[place], [place], [phone]

## 2016-01-04 NOTE — Progress Notes (Signed)
Primary Cardiologist: Dina Rich   Cardiology Specific Problem List: 1.Atrial fib with RVR 2. Chronic LBBB 3. Hypotension  Subjective:    No acute distress. She is hypotensive and amiodarone stopped overnight.   Objective:   Temp:  [96.1 F (35.6 C)-97.9 F (36.6 C)] 97.9 F (36.6 C) (03/10 0400) Pulse Rate:  [32-136] 47 (03/10 0650) Resp:  [13-25] 21 (03/10 0650) BP: (53-124)/(31-100) 89/64 mmHg (03/10 0650) SpO2:  [85 %-100 %] 89 % (03/10 0650) Weight:  [257 lb 0.9 oz (116.6 kg)] 257 lb 0.9 oz (116.6 kg) (03/10 0500) Last BM Date: 01/03/16  Filed Weights   01/02/16 0500 01/03/16 0500 01/04/16 0500  Weight: 247 lb 2.2 oz (112.1 kg) 246 lb 14.6 oz (112 kg) 257 lb 0.9 oz (116.6 kg)    Intake/Output Summary (Last 24 hours) at 01/04/16 0742 Last data filed at 01/04/16 0500  Gross per 24 hour  Intake   1265 ml  Output    700 ml  Net    565 ml   Echocardiogram 01/01/2016 Left ventricle: The cavity size was normal. Wall thickness was  increased increased in a pattern of mild to moderate LVH.  Systolic function was mildly reduced. The estimated ejection  fraction was in the range of 45% to 50%. - Regional wall motion abnormality: Hypokinesis of the basal-mid  anteroseptal, basal inferoseptal, and apical septal myocardium;  probable hypokinesis of the mid inferoseptal myocardium. - Aortic valve: Moderately to severely calcified annulus.  Trileaflet; moderately thickened leaflets. Valve area (VTI): 1.54  cm^2. - Aortic root: The visualized portion of the proximal ascending  aorta is mildly dilated at 3.8 cm. - Mitral valve: Severely calcified annulus. Moderately thickened  leaflets . There was mild regurgitation. - Left atrium: The atrium was severely dilated. - Right ventricle: The cavity size was moderately dilated. Systolic  function was mildly to moderately reduced. - Right atrium: The atrium was severely dilated. The atrial septum  bows to the  left suggesting elevated RA pressure. - Pulmonary arteries: Systolic pressure was moderately increased.  PA peak pressure: 47 mm Hg (S). - Inferior vena cava: The vessel was dilated. The respirophasic  diameter changes were blunted (< 50%), consistent with elevated  central venous pressure. - Technically adequate study.  Telemetry: Atrial fib rates up to  130 bpm with avg HR now 90's.   Exam:  General: No acute distress.  HEENT: Conjunctiva and lids normal, oropharynx clear.  Lungs: Clear to auscultation, nonlabored. Diminished in the bases.   Cardiac: No elevated JVP or bruits. IRRR, tachycardic, no gallop or rub.   Abdomen: Normoactive bowel sounds, nontender, nondistended.  Extremities: No pitting edema, distal pulses full.  Neuropsychiatric: Alert and oriented x3, affect appropriate.   Lab Results:  Basic Metabolic Panel:  Recent Labs Lab 12/31/15 1012  01/02/16 0455 01/03/16 0440 01/04/16 0432  NA  --   < > 135 134* 133*  K  --   < > 4.1 3.7 3.9  CL  --   < > 102 101 102  CO2  --   < > 19* 23 22  GLUCOSE  --   < > 69 93 105*  BUN  --   < > 37* 36* 39*  CREATININE  --   < > 2.31* 2.33* 2.50*  CALCIUM  --   < > 8.6* 8.4* 8.4*  MG 1.4*  --   --   --   --   < > = values in this interval  not displayed.  Liver Function Tests:  Recent Labs Lab 01/01/16 0829 01/03/16 0440 01/04/16 0432  AST 80* 127* 134*  ALT 62* 105* 119*  ALKPHOS 144* 139* 133*  BILITOT 2.0* 1.3* 1.1  PROT 6.6 5.7* 5.5*  ALBUMIN 3.6 2.9* 2.9*    CBC:  Recent Labs Lab 12/30/15 1830 12/31/15 0419 01/04/16 0432  WBC 5.7 5.3 6.9  HGB 12.4 12.1 12.2  HCT 38.3 37.2 37.8  MCV 94.3 94.4 93.8  PLT 154 159 158    Cardiac Enzymes:  Recent Labs Lab 12/30/15 2311 12/31/15 0419 12/31/15 1012  TROPONINI 0.04* 0.03 0.03    Radiology: No results found.   ECG:   Medications:   Scheduled Medications: . ALPRAZolam  0.5 mg Oral BID  . aspirin EC  81 mg Oral Daily  .  atorvastatin  40 mg Oral Daily  . citalopram  10 mg Oral QHS  . enoxaparin (LOVENOX) injection  30 mg Subcutaneous Q24H  . levothyroxine  125 mcg Oral QAC breakfast  . loratadine  10 mg Oral Daily  . magnesium oxide  400 mg Oral BID  . metoprolol tartrate  50 mg Oral BID  . pantoprazole  40 mg Oral Daily  . sodium chloride flush  3 mL Intravenous Q12H    Infusions: . amiodarone Stopped (01/04/16 0315)    PRN Medications: sodium chloride, acetaminophen **OR** acetaminophen, HYDROcodone-acetaminophen, ondansetron **OR** ondansetron (ZOFRAN) IV, sodium chloride flush   Assessment and Plan:   1. Atrial fib with RVR: She was taken off of amiodarone gtt overnight due to hypotension.She remains on metoprolol 50 mg BID. Received fluid bolus over night.  She is tired but otherwise stable.   Review of labs demonstrates worsening renal function. Will begin 1/2 NS for rehydration at 75 cc hour. With EF of 45%-50 will watch closely for fluid overload.Consider stopping Celexa. She i Not found to be anemic.  2. Persistent hypotension on antiarrythmic medications: Giving IV fluids and will check cortisol level.  3. Elevated LFTS: On statin. Will stop this. May also be related to amiodarone IV, but were elevated prior to the administration of amiodarone.   4. Hypothyroidism: Now on Synthroid.    Bettey MareKathryn M. Lawrence NP AACC  01/04/2016, 7:42 AM   The patient was seen and examined, and I agree with the assessment and plan as documented above. Rhythm currently sinus tachycardia. Continue metoprolol. Will gently hydrate for hypotension. BUN/SCr supportive of prerenal etiology which may be due to both hypotension from intravascular depletion.  Prentice DockerSuresh Eulanda Dorion, MD, [facility] CenterFACC  01/04/2016 4:59 PM

## 2016-01-04 NOTE — Care Management Note (Signed)
Case Management Note  Patient Details  Name: Arlyn LeakGarnett M Isa MRN: [id number] Date of Birth: February 07, 1921  Expected Discharge Date:     01/08/2016             Expected Discharge Plan:  Skilled Nursing Facility  In-House Referral:  Clinical Social Work  Discharge planning Services  CM Consult  Post Acute Care Choice:  NA Choice offered to:  NA  DME Arranged:    DME Agency:     HH Arranged:    HH Agency:     Status of Service:  Completed, signed off  Medicare Important Message Given:  Yes Date Medicare IM Given:    Medicare IM give by:    Date Additional Medicare IM Given:    Additional Medicare Important Message give by:     If discussed at Long Length of Stay Meetings, dates discussed:    Additional Comments: PT has recommended SNF. CSW is aware and will work with pt/family to find placement.   Malcolm Metrohildress, Mikisha Roseland Demske, RN 01/04/2016, 12:20 PM

## 2016-01-04 NOTE — Plan of Care (Signed)
Problem: Acute Rehab PT Goals(only PT should resolve) Goal: Pt Will Go Supine/Side To Sit Pt will demonstrate ModI bed mobility supine to sitting edge-of-bed to return to PLOF and to decrease caregiver burden.     Goal: Patient Will Transfer Sit To/From Stand Pt will transfer sit to/from-stand with RW at ModI without loss-of-balance to demonstrate good safety awareness for independent mobility in home.     Goal: Pt Will Ambulate Pt will ambulate with RW at Supervision distance greater than 16000ft to demonstrate the ability to perform safe household distance ambulation at discharge.

## 2016-01-04 NOTE — Care Management Important Message (Signed)
Important Message  Patient Details  Name: Michaela Tyler MRN: [id number] Date of Birth: 23-Nov-1920   Medicare Important Message Given:  Yes    Malcolm MetroChildress, Kerith Sherley Demske, RN 01/04/2016, 12:20 PM

## 2016-01-04 NOTE — Evaluation (Signed)
Physical Therapy Evaluation Patient Details Name: Michaela Tyler MRN: [id number] DOB: [birth year] Today's Date: 01/04/2016   History of Present Illness  82yo black female who comes to [facility] after 2w LEE and weakness. PMH: LBBB, hypotension, HTN, GERD, R hip pain, L partial mastectomy.   Clinical Impression  At evaluation, pt is received semirecumbent in bed upon entry, no family/caregiver present. The pt is awake and agreeable to participate. No acute distress noted at this time. The pt is alert and oriented x3 although cognitive deficits are patent. Pt is a limited historian, frequently offering conflicting responses, which seems mostly due to being [facility], however cognitive deficit cannot be ruled out at this time. Pt is pleasant, conversational, and following simple commands 25% of the time. Pt reports falls at home, and is never able to acquire balance while in stance during this evaluation, consistently leaning posteriorly. Pt global strength as screened during functional mobility assessment presents with moderate impairment, the pt now requiring heavy physical assistance for bed mobility, transfers, and gait, whereas the patient reportedly performs these indep at baseline. Most concerning pt appears to be stuck on the EOB after transfer trial, unable to fully get back in the bed, and unable to bring legs into the bed, all while gradually sliding toward the floor. Pt received on 2L O2, however trialed on RA, wherein only mild noted desaturation with activity (94%); pt is not on O2 at baseline at home.   Patient presenting with impairment of strength, range of motion, balance, oxygen perfusion, cognition, and activity tolerance, limiting ability to perform ADL and mobility tasks at  baseline level of function. Patient will benefit from skilled intervention to address the above impairments and limitations, in order to restore to prior level of function, improve patient safety upon discharge, and to decrease  falls risk.       Follow Up Recommendations SNF    Equipment Recommendations  None recommended by PT    Recommendations for Other Services       Precautions / Restrictions Precautions Precautions: None Restrictions Weight Bearing Restrictions: No      Mobility  Bed Mobility Overal bed mobility: +2 for physical assistance;Needs Assistance Bed Mobility: Supine to Sit;Sit to Supine     Supine to sit: Min guard Sit to supine: Total assist;+2 for physical assistance   General bed mobility comments: loss of ability to get away from EOB, bridge, scoot, or bring legs into bed. Very unstaedy on EDGE, likely would have fallen onto floor without assistance.   Transfers Overall transfer level: Needs assistance Equipment used: Rolling walker (2 wheeled);1 person hand held assist Transfers: Sit to/from Stand Sit to Stand: From elevated surface;Mod assist;Min assist         General transfer comment: multiple attempts with RW and bed normal height, unable; performed with elevated bed and HHA, maintain a posterior lean the entire time. Very quads dominant, poor contribution from hip extensors.   Ambulation/Gait Ambulation/Gait assistance:  (unable to ambulate, MaxA given for weight shift, not abel to take any meaningful steps. )              Stairs            Wheelchair Mobility    Modified Rankin (Stroke Patients Only)       Balance Overall balance assessment: Needs assistance;History of Falls Sitting-balance support: No upper extremity supported;Feet supported Sitting balance-Leahy Scale: Normal     Standing balance support: Bilateral upper extremity supported;During functional activity Standing balance-Leahy Scale: Zero  Pertinent Vitals/Pain Pain Assessment: No/denies pain    Home Living Family/patient expects to be discharged to:: Private residence Living Arrangements: Spouse/significant other Available Help  at Discharge: Family (niece, husband ) Type of Home: House Home Access: Stairs to enter   Enter[employer] CorporationEntrance Stairs-Number of Steps: unclear, questioning confuses patient.  Home Layout:  (unclear, questioning confuses patient. ) Home Equipment: Walker - 2 wheels Additional Comments: unclear, questioning confuses patient.     Prior Function           Comments: No family caregiver to confirm; patient reports that everything has been more difficulty in the last 6-12 months, even bathing and dressing.      Hand Dominance        Extremity/Trunk Assessment   Upper Extremity Assessment: Generalized weakness           Lower Extremity Assessment: Generalized weakness      Cervical / Trunk Assessment:  (cannot correct posterior lean, although she attempts by using hands )  Communication   Communication: HOH  Cognition Arousal/Alertness: Awake/alert Behavior During Therapy:  (cooperative, but often confused by commands or questioning. ) Overall Cognitive Status: No family/caregiver present to determine baseline cognitive functioning                      General Comments      Exercises        Assessment/Plan    PT Assessment Patient needs continued PT services  PT Diagnosis Difficulty walking;Generalized weakness   PT Problem List    PT Treatment Interventions DME instruction;Gait training;Stair training;Functional mobility training;Therapeutic activities;Therapeutic exercise;Balance training;Patient/family education   PT Goals (Current goals can be found in the Care Plan section) Acute Rehab PT Goals Patient Stated Goal: Go home.  PT Goal Formulation: With patient Time For Goal Achievement: 01/18/16 Potential to Achieve Goals: Fair    Frequency Min 3X/week   Barriers to discharge        Co-evaluation               End of Session Equipment Utilized During Treatment: Gait belt;Oxygen Activity Tolerance: Patient limited by fatigue Patient left: in  bed;with call bell/phone within reach Nurse Communication: Mobility status;Other (comment)         Time: [id number] PT Time Calculation (min) (ACUTE ONLY): 24 min   Charges:   PT Evaluation $PT Eval Moderate Complexity: 1 Procedure PT Treatments $Therapeutic Activity: 8-22 mins   PT G Codes:        12:13 PM, 01/04/2016 Rosamaria LintsAllan C Lazarus [place], PT, DPT PRN [facility] License # [id number] [phone] (wireless)  [phone] (mobile)

## 2016-01-05 LAB — COMPREHENSIVE METABOLIC PANEL
ALT: 143 U/L — ABNORMAL HIGH (ref 14–54)
ANION GAP: 9 (ref 5–15)
AST: 153 U/L — ABNORMAL HIGH (ref 15–41)
Albumin: 2.9 g/dL — ABNORMAL LOW (ref 3.5–5.0)
Alkaline Phosphatase: 142 U/L — ABNORMAL HIGH (ref 38–126)
BILIRUBIN TOTAL: 1.5 mg/dL — AB (ref 0.3–1.2)
BUN: 41 mg/dL — ABNORMAL HIGH (ref 6–20)
CO2: 20 mmol/L — ABNORMAL LOW (ref 22–32)
Calcium: 8.2 mg/dL — ABNORMAL LOW (ref 8.9–10.3)
Chloride: 103 mmol/L (ref 101–111)
Creatinine, Ser: 2.58 mg/dL — ABNORMAL HIGH (ref 0.44–1.00)
GFR, EST AFRICAN AMERICAN: 17 mL/min — AB (ref >60)
GFR, EST NON AFRICAN AMERICAN: 15 mL/min — AB (ref >60)
Glucose, Bld: 79 mg/dL (ref 65–99)
POTASSIUM: 3.9 mmol/L (ref 3.5–5.1)
Sodium: 132 mmol/L — ABNORMAL LOW (ref 135–145)
TOTAL PROTEIN: 5.5 g/dL — AB (ref 6.5–8.1)

## 2016-01-05 NOTE — Progress Notes (Signed)
Pts HCPOA, Candis SchatzSharon Cain, called and voiced concerns over patient going home from hospital. She says pt who is [age] lives with husband who is 6489 and is also an inpatient on [address]. She is concerned about both being discharged home and not being able to care for themselves. She is also concerned that pt is increasingly confused; states that this is far from pt's baseline. Pt also normally able to feed self and is now dependent on others to feed her.

## 2016-01-05 NOTE — Progress Notes (Signed)
Subjective: Patient is alert and awake but chronically sick looking. Her B/P remained on the lower side... Objective: Vital signs in last 24 hours: Temp:  [96.4 F (35.8 C)-97.7 F (36.5 C)] 96.8 F (36 C) (03/11 0821) Pulse Rate:  [43-115] 114 (03/11 1000) Resp:  [13-32] 15 (03/11 1000) BP: (67-105)/(45-89) 101/76 mmHg (03/11 1000) SpO2:  [89 %-100 %] 99 % (03/11 1000) Weight:  [117.8 kg (259 lb 11.2 oz)] 117.8 kg (259 lb 11.2 oz) (03/11 0400) Weight change: 1.2 kg (2 lb 10.3 oz) Last BM Date: 01/03/16  Intake/Output from previous day: 03/10 0701 - 03/11 0700 In: 1877.5 [P.O.:480; I.V.:1397.5] Out: 300 [Urine:300]  PHYSICAL EXAM General appearance: alert, cooperative and moderately obese Resp: diminished breath sounds bilaterally and rhonchi bilaterally Cardio: regularly irregular rhythm GI: soft, non-tender; bowel sounds normal; no masses,  no organomegaly Extremities: edema 3+++  Lab Results:  Results for orders placed or performed during the hospital encounter of 12/30/15 (from the past 48 hour(s))  Comprehensive metabolic panel     Status: Abnormal   Collection Time: 01/04/16  4:32 AM  Result Value Ref Range   Sodium 133 (L) 135 - 145 mmol/L   Potassium 3.9 3.5 - 5.1 mmol/L   Chloride 102 101 - 111 mmol/L   CO2 22 22 - 32 mmol/L   Glucose, Bld 105 (H) 65 - 99 mg/dL   BUN 39 (H) 6 - 20 mg/dL   Creatinine, Ser 2.50 (H) 0.44 - 1.00 mg/dL   Calcium 8.4 (L) 8.9 - 10.3 mg/dL   Total Protein 5.5 (L) 6.5 - 8.1 g/dL   Albumin 2.9 (L) 3.5 - 5.0 g/dL   AST 134 (H) 15 - 41 U/L   ALT 119 (H) 14 - 54 U/L   Alkaline Phosphatase 133 (H) 38 - 126 U/L   Total Bilirubin 1.1 0.3 - 1.2 mg/dL   GFR calc non Af Amer 16 (L) >60 mL/min   GFR calc Af Amer 18 (L) >60 mL/min    Comment: (NOTE) The eGFR has been calculated using the CKD EPI equation. This calculation has not been validated in all clinical situations. eGFR's persistently <60 mL/min signify possible Chronic  Kidney Disease.    Anion gap 9 5 - 15  CBC     Status: Abnormal   Collection Time: 01/04/16  4:32 AM  Result Value Ref Range   WBC 6.9 4.0 - 10.5 K/uL   RBC 4.03 3.87 - 5.11 MIL/uL   Hemoglobin 12.2 12.0 - 15.0 g/dL   HCT 37.8 36.0 - 46.0 %   MCV 93.8 78.0 - 100.0 fL   MCH 30.3 26.0 - 34.0 pg   MCHC 32.3 30.0 - 36.0 g/dL   RDW 17.4 (H) 11.5 - 15.5 %   Platelets 158 150 - 400 K/uL  Cortisol-am, blood     Status: None   Collection Time: 01/04/16  8:05 AM  Result Value Ref Range   Cortisol - AM 21.7 6.7 - 22.6 ug/dL    Comment: Performed at [facility]  Comprehensive metabolic panel     Status: Abnormal   Collection Time: 01/05/16  4:28 AM  Result Value Ref Range   Sodium 132 (L) 135 - 145 mmol/L   Potassium 3.9 3.5 - 5.1 mmol/L   Chloride 103 101 - 111 mmol/L   CO2 20 (L) 22 - 32 mmol/L   Glucose, Bld 79 65 - 99 mg/dL   BUN 41 (H) 6 - 20 mg/dL   Creatinine, Ser 2.58 (H)  0.44 - 1.00 mg/dL   Calcium 8.2 (L) 8.9 - 10.3 mg/dL   Total Protein 5.5 (L) 6.5 - 8.1 g/dL   Albumin 2.9 (L) 3.5 - 5.0 g/dL   AST 153 (H) 15 - 41 U/L   ALT 143 (H) 14 - 54 U/L   Alkaline Phosphatase 142 (H) 38 - 126 U/L   Total Bilirubin 1.5 (H) 0.3 - 1.2 mg/dL   GFR calc non Af Amer 15 (L) >60 mL/min   GFR calc Af Amer 17 (L) >60 mL/min    Comment: (NOTE) The eGFR has been calculated using the CKD EPI equation. This calculation has not been validated in all clinical situations. eGFR's persistently <60 mL/min signify possible Chronic Kidney Disease.    Anion gap 9 5 - 15    ABGS No results for input(s): PHART, PO2ART, TCO2, HCO3 in the last 72 hours.  Invalid input(s): PCO2 CULTURES Recent Results (from the past 240 hour(s))  MRSA PCR Screening     Status: None   Collection Time: 12/30/15 10:38 PM  Result Value Ref Range Status   MRSA by PCR NEGATIVE NEGATIVE Final    Comment:        The GeneXpert MRSA Assay (FDA approved for NASAL specimens only), is one component of  a comprehensive MRSA colonization surveillance program. It is not intended to diagnose MRSA infection nor to guide or monitor treatment for MRSA infections.   Urine culture     Status: None   Collection Time: 12/31/15  8:55 AM  Result Value Ref Range Status   Specimen Description URINE, CLEAN CATCH  Final   Special Requests NONE  Final   Culture   Final    NO GROWTH 1 DAY Performed at [facility]    Report Status 01/01/2016 FINAL  Final   Studies/Results: US Abdomen Complete  01/04/2016  CLINICAL DATA:  Patient with abnormal LFTs.  Prior hysterectomy. EXAM: ABDOMEN ULTRASOUND COMPLETE COMPARISON:  Chest CT 07/13/2015 FINDINGS: Gallbladder: The gallbladder is mildly contracted. Gallbladder wall thickening upper limits of normal. No pericholecystic fluid. Negative sonographic Murphy's sign. No definite gallstones identified. Common bile duct: Diameter: 2 mm Liver: No focal lesion identified. Within normal limits in parenchymal echogenicity. IVC: No abnormality visualized. Pancreas: Visualized portion unremarkable. Spleen: Size and appearance within normal limits. Right Kidney: Length: 11.0 cm. There is a 1.7 cm cyst within the superior pole. Normal renal cortical thickness echogenicity. No hydronephrosis. Left Kidney: Length: 10.3 cm. Echogenicity within normal limits. No mass or hydronephrosis visualized. Abdominal aorta: No aneurysm visualized. Other findings: None. IMPRESSION: No acute process within the abdomen. No cholelithiasis or sonographic evidence for acute cholecystitis. No biliary ductal dilatation. Electronically Signed   By: Lovey Newcomer M.D.   On: 01/04/2016 08:48    Medications: I have reviewed the patient's current medications.  Assesment:   Active Problems:   Atrial fibrillation (HCC)   AKI (acute kidney injury) ([place]) hypertension DJD Hypothyroidism Acute on Chronic systolic CHF Abnormal LFT  Plan: Medications reviewed Will continue current treatment as  per cardiology recommendation Will monitor CMP GI Consult Pending      LOS: 3 days   Michaela Tyler 01/05/2016, 11:13 AM

## 2016-01-05 NOTE — Plan of Care (Signed)
Problem: Education: Goal: Knowledge of disease or condition will improve Outcome: Not Applicable Date Met:  83/23/46 Pt is very forgetful Goal: Understanding of medication regimen will improve Outcome: Progressing Husband understands and he manages pt's medication

## 2016-01-06 LAB — COMPREHENSIVE METABOLIC PANEL
ALT: 154 U/L — ABNORMAL HIGH (ref 14–54)
ANION GAP: 8 (ref 5–15)
AST: 135 U/L — ABNORMAL HIGH (ref 15–41)
Albumin: 3.1 g/dL — ABNORMAL LOW (ref 3.5–5.0)
Alkaline Phosphatase: 158 U/L — ABNORMAL HIGH (ref 38–126)
BILIRUBIN TOTAL: 1.6 mg/dL — AB (ref 0.3–1.2)
BUN: 39 mg/dL — AB (ref 6–20)
CO2: 22 mmol/L (ref 22–32)
Calcium: 8.4 mg/dL — ABNORMAL LOW (ref 8.9–10.3)
Chloride: 101 mmol/L (ref 101–111)
Creatinine, Ser: 2.36 mg/dL — ABNORMAL HIGH (ref 0.44–1.00)
GFR calc Af Amer: 19 mL/min — ABNORMAL LOW (ref >60)
GFR, EST NON AFRICAN AMERICAN: 17 mL/min — AB (ref >60)
Glucose, Bld: 90 mg/dL (ref 65–99)
POTASSIUM: 4 mmol/L (ref 3.5–5.1)
Sodium: 131 mmol/L — ABNORMAL LOW (ref 135–145)
TOTAL PROTEIN: 6 g/dL — AB (ref 6.5–8.1)

## 2016-01-06 LAB — CBC
HEMATOCRIT: 39.6 % (ref 36.0–46.0)
Hemoglobin: 12.6 g/dL (ref 12.0–15.0)
MCH: 29.8 pg (ref 26.0–34.0)
MCHC: 31.8 g/dL (ref 30.0–36.0)
MCV: 93.6 fL (ref 78.0–100.0)
Platelets: 170 10*3/uL (ref 150–400)
RBC: 4.23 MIL/uL (ref 3.87–5.11)
RDW: 17.2 % — AB (ref 11.5–15.5)
WBC: 6.6 10*3/uL (ref 4.0–10.5)

## 2016-01-06 NOTE — Progress Notes (Signed)
Pt transferred to room 330 on telemetry. Hr 105 in a fib. Iv  And foley cath patent. o2 at 2l/min via [place]. Pt denies any discomfort or distress. [facility]. Husband remains at bedside.

## 2016-01-06 NOTE — Progress Notes (Signed)
Subjective: Patient is resting. Her B/P is improving. She is tolerating the beta blocker Objective: Vital signs in last 24 hours: Temp:  [96.4 F (35.8 C)-97.9 F (36.6 C)] 96.8 F (36 C) (03/12 0750) Resp:  [13-30] 27 (03/11 2200) BP: (55-111)/(35-99) 97/73 mmHg (03/12 0700) Weight:  [119.3 kg (263 lb 0.1 oz)] 119.3 kg (263 lb 0.1 oz) (03/12 0500) Weight change: 1.5 kg (3 lb 4.9 oz) Last BM Date: 01/03/16  Intake/Output from previous day: 03/11 0701 - 03/12 0700 In: 2205 [P.O.:480; I.V.:1725] Out: 1125 [Urine:1125]  PHYSICAL EXAM General appearance: alert, cooperative and moderately obese Resp: diminished breath sounds bilaterally and rhonchi bilaterally Cardio: regularly irregular rhythm GI: soft, non-tender; bowel sounds normal; no masses,  no organomegaly Extremities: edema 3+++  Lab Results:  Results for orders placed or performed during the hospital encounter of 12/30/15 (from the past 48 hour(s))  Comprehensive metabolic panel     Status: Abnormal   Collection Time: 01/05/16  4:28 AM  Result Value Ref Range   Sodium 132 (L) 135 - 145 mmol/L   Potassium 3.9 3.5 - 5.1 mmol/L   Chloride 103 101 - 111 mmol/L   CO2 20 (L) 22 - 32 mmol/L   Glucose, Bld 79 65 - 99 mg/dL   BUN 41 (H) 6 - 20 mg/dL   Creatinine, Ser 2.58 (H) 0.44 - 1.00 mg/dL   Calcium 8.2 (L) 8.9 - 10.3 mg/dL   Total Protein 5.5 (L) 6.5 - 8.1 g/dL   Albumin 2.9 (L) 3.5 - 5.0 g/dL   AST 153 (H) 15 - 41 U/L   ALT 143 (H) 14 - 54 U/L   Alkaline Phosphatase 142 (H) 38 - 126 U/L   Total Bilirubin 1.5 (H) 0.3 - 1.2 mg/dL   GFR calc non Af Amer 15 (L) >60 mL/min   GFR calc Af Amer 17 (L) >60 mL/min    Comment: (NOTE) The eGFR has been calculated using the CKD EPI equation. This calculation has not been validated in all clinical situations. eGFR's persistently <60 mL/min signify possible Chronic Kidney Disease.    Anion gap 9 5 - 15  CBC     Status: Abnormal   Collection Time: 01/06/16  4:26 AM  Result  Value Ref Range   WBC 6.6 4.0 - 10.5 K/uL   RBC 4.23 3.87 - 5.11 MIL/uL   Hemoglobin 12.6 12.0 - 15.0 g/dL   HCT 39.6 36.0 - 46.0 %   MCV 93.6 78.0 - 100.0 fL   MCH 29.8 26.0 - 34.0 pg   MCHC 31.8 30.0 - 36.0 g/dL   RDW 17.2 (H) 11.5 - 15.5 %   Platelets 170 150 - 400 K/uL  Comprehensive metabolic panel     Status: Abnormal   Collection Time: 01/06/16  4:26 AM  Result Value Ref Range   Sodium 131 (L) 135 - 145 mmol/L   Potassium 4.0 3.5 - 5.1 mmol/L   Chloride 101 101 - 111 mmol/L   CO2 22 22 - 32 mmol/L   Glucose, Bld 90 65 - 99 mg/dL   BUN 39 (H) 6 - 20 mg/dL   Creatinine, Ser 2.36 (H) 0.44 - 1.00 mg/dL   Calcium 8.4 (L) 8.9 - 10.3 mg/dL   Total Protein 6.0 (L) 6.5 - 8.1 g/dL   Albumin 3.1 (L) 3.5 - 5.0 g/dL   AST 135 (H) 15 - 41 U/L   ALT 154 (H) 14 - 54 U/L   Alkaline Phosphatase 158 (H) 38 - 126 U/L  Total Bilirubin 1.6 (H) 0.3 - 1.2 mg/dL   GFR calc non Af Amer 17 (L) >60 mL/min   GFR calc Af Amer 19 (L) >60 mL/min    Comment: (NOTE) The eGFR has been calculated using the CKD EPI equation. This calculation has not been validated in all clinical situations. eGFR's persistently <60 mL/min signify possible Chronic Kidney Disease.    Anion gap 8 5 - 15    ABGS No results for input(s): PHART, PO2ART, TCO2, HCO3 in the last 72 hours.  Invalid input(s): PCO2 CULTURES Recent Results (from the past 240 hour(s))  MRSA PCR Screening     Status: None   Collection Time: 12/30/15 10:38 PM  Result Value Ref Range Status   MRSA by PCR NEGATIVE NEGATIVE Final    Comment:        The GeneXpert MRSA Assay (FDA approved for NASAL specimens only), is one component of a comprehensive MRSA colonization surveillance program. It is not intended to diagnose MRSA infection nor to guide or monitor treatment for MRSA infections.   Urine culture     Status: None   Collection Time: 12/31/15  8:55 AM  Result Value Ref Range Status   Specimen Description URINE, CLEAN CATCH  Final    Special Requests NONE  Final   Culture   Final    NO GROWTH 1 DAY Performed at [facility]    Report Status 01/01/2016 FINAL  Final   Studies/Results: No results found.  Medications: I have reviewed the patient's current medications.  Assesment:   Active Problems:   Atrial fibrillation (HCC)   AKI (acute kidney injury) ([place]) hypertension DJD Hypothyroidism Acute on Chronic systolic CHF Abnormal LFT  Plan: Medications reviewed Will continue current treatment as per cardiology recommendation Will monitor CMP GI Consult Pending      LOS: 4 days   Michaela Tyler 01/06/2016, 10:06 AM

## 2016-01-07 ENCOUNTER — Encounter (HOSPITAL_COMMUNITY): Payer: Self-pay | Admitting: Gastroenterology

## 2016-01-07 DIAGNOSIS — R7989 Other specified abnormal findings of blood chemistry: Secondary | ICD-10-CM

## 2016-01-07 DIAGNOSIS — I959 Hypotension, unspecified: Secondary | ICD-10-CM

## 2016-01-07 DIAGNOSIS — R778 Other specified abnormalities of plasma proteins: Secondary | ICD-10-CM | POA: Insufficient documentation

## 2016-01-07 DIAGNOSIS — I481 Persistent atrial fibrillation: Secondary | ICD-10-CM

## 2016-01-07 DIAGNOSIS — I5023 Acute on chronic systolic (congestive) heart failure: Secondary | ICD-10-CM

## 2016-01-07 DIAGNOSIS — R945 Abnormal results of liver function studies: Secondary | ICD-10-CM | POA: Insufficient documentation

## 2016-01-07 DIAGNOSIS — I4891 Unspecified atrial fibrillation: Secondary | ICD-10-CM | POA: Insufficient documentation

## 2016-01-07 LAB — HEPATIC FUNCTION PANEL
ALBUMIN: 3.3 g/dL — AB (ref 3.5–5.0)
ALT: 135 U/L — ABNORMAL HIGH (ref 14–54)
AST: 91 U/L — AB (ref 15–41)
Alkaline Phosphatase: 165 U/L — ABNORMAL HIGH (ref 38–126)
BILIRUBIN DIRECT: 0.7 mg/dL — AB (ref 0.1–0.5)
Indirect Bilirubin: 1.3 mg/dL — ABNORMAL HIGH (ref 0.3–0.9)
Total Bilirubin: 2 mg/dL — ABNORMAL HIGH (ref 0.3–1.2)
Total Protein: 6.4 g/dL — ABNORMAL LOW (ref 6.5–8.1)

## 2016-01-07 LAB — PROTIME-INR
INR: 1.15 (ref 0.00–1.49)
Prothrombin Time: 14.9 seconds (ref 11.6–15.2)

## 2016-01-07 MED ORDER — HEPARIN BOLUS VIA INFUSION
4000.0000 [IU] | Freq: Once | INTRAVENOUS | Status: AC
  Administered 2016-01-07: 4000 [IU] via INTRAVENOUS
  Filled 2016-01-07: qty 4000

## 2016-01-07 MED ORDER — FUROSEMIDE 10 MG/ML IJ SOLN
80.0000 mg | Freq: Once | INTRAMUSCULAR | Status: AC
  Administered 2016-01-07: 80 mg via INTRAVENOUS
  Filled 2016-01-07: qty 8

## 2016-01-07 MED ORDER — HEPARIN (PORCINE) IN NACL 100-0.45 UNIT/ML-% IJ SOLN
1250.0000 [IU]/h | INTRAMUSCULAR | Status: DC
  Administered 2016-01-07 – 2016-01-08 (×2): 1250 [IU]/h via INTRAVENOUS
  Filled 2016-01-07 (×2): qty 250

## 2016-01-07 MED ORDER — FUROSEMIDE 10 MG/ML IJ SOLN
80.0000 mg | Freq: Once | INTRAMUSCULAR | Status: DC
Start: 2016-01-07 — End: 2016-01-07

## 2016-01-07 NOTE — NC FL2 (Signed)
[place] MEDICAID FL2 LEVEL OF CARE SCREENING TOOL     IDENTIFICATION  Patient Name: Michaela Tyler Birthdate: 12-Nov-1920 Sex: female Admission Date (Current Location): 12/30/2015  [facility] [place]   Facility and Address:  [facility],  [address], [place] [id number]      Provider Number: [id number]  Attending Physician Name and Address:  Avon Gullyesfaye Fanta, MD  Relative Name and Phone Number:       Current Level of Care: Hospital Recommended Level of Care: Skilled Nursing Facility Prior Approval Number:    Date Approved/Denied:   PASRR Number:   [id number]   Discharge Plan: SNF    Current Diagnoses: Patient Active Problem List   Diagnosis Date Noted  . Atrial fibrillation (HCC) 12/30/2015  . AKI (acute kidney injury) (HCC) 12/30/2015  . Atrial fibrillation with RVR (HCC) 07/13/2015  . Chronic atrial fibrillation (HCC) 01/12/2013  . Left bundle branch block 01/12/2013  . At high risk for falls 01/12/2013    Orientation RESPIRATION BLADDER Height & Weight     Self, Situation, Place  O2, Other (Comment) (not used at home, used in hospital.  2L) Continent Weight: 269 lb 9.6 oz (122.29 kg) Height:  5\' 8"  (172.7 cm)  BEHAVIORAL SYMPTOMS/MOOD NEUROLOGICAL BOWEL NUTRITION STATUS  Other (Comment) (calm and cooperative/no behaviors)   Continent Diet (Heart Healthy)  AMBULATORY STATUS COMMUNICATION OF NEEDS Skin   Extensive Assist Verbally Normal                       Personal Care Assistance Level of Assistance  Bathing, Feeding, Dressing Bathing Assistance: Limited assistance Feeding assistance: Limited assistance Dressing Assistance: Limited assistance     Functional Limitations Info  Sight, Hearing, Speech Sight Info: Impaired Hearing Info: Impaired Speech Info: Adequate    SPECIAL CARE FACTORS FREQUENCY  PT (By licensed PT), OT (By licensed OT)     PT Frequency: 3 OT Frequency: 3            Contractures  Contractures Info: Not present    Additional Factors Info  Code Status, Allergies, Psychotropic, Insulin Sliding Scale, Isolation Precautions Code Status Info: Full Allergies Info: NKA Psychotropic Info: Xanax: anxiety    Celexa: depression Insulin Sliding Scale Info: None Isolation Precautions Info: None     Current Medications (01/07/2016):  This is the current hospital active medication list Current Facility-Administered Medications  Medication Dose Route Frequency Provider Last Rate Last Dose  . 0.45 % sodium chloride infusion   Intravenous Continuous Jodelle GrossKathryn M Lawrence, NP 75 mL/hr at 01/07/16 1050    . 0.9 %  sodium chloride infusion  250 mL Intravenous PRN Meredeth IdeGagan S Lama, MD 10 mL/hr at 01/06/16 2059 250 mL at 01/06/16 2059  . acetaminophen (TYLENOL) tablet 650 mg  650 mg Oral Q6H PRN Meredeth IdeGagan S Lama, MD       Or  . acetaminophen (TYLENOL) suppository 650 mg  650 mg Rectal Q6H PRN Meredeth IdeGagan S Lama, MD      . ALPRAZolam Prudy Feeler(XANAX) tablet 0.5 mg  0.5 mg Oral BID Meredeth IdeGagan S Lama, MD   0.5 mg at 01/07/16 0911  . aspirin EC tablet 81 mg  81 mg Oral Daily Meredeth IdeGagan S Lama, MD   81 mg at 01/07/16 0911  . citalopram (CELEXA) tablet 10 mg  10 mg Oral QHS Meredeth IdeGagan S Lama, MD   10 mg at 01/06/16 2114  . enoxaparin (LOVENOX) injection 30 mg  30 mg Subcutaneous Q24H  Meredeth Ide, MD   30 mg at 01/06/16 2112  . HYDROcodone-acetaminophen (NORCO/VICODIN) 5-325 MG per tablet 1 tablet  1 tablet Oral BID PRN Meredeth Ide, MD   1 tablet at 01/06/16 0100  . levothyroxine (SYNTHROID, LEVOTHROID) tablet 125 mcg  125 mcg Oral QAC breakfast Avon Gully, MD   125 mcg at 01/07/16 0911  . loratadine (CLARITIN) tablet 10 mg  10 mg Oral Daily Meredeth Ide, MD   10 mg at 01/07/16 0911  . magnesium oxide (MAG-OX) tablet 400 mg  400 mg Oral BID Avon Gully, MD   400 mg at 01/07/16 0911  . metoprolol (LOPRESSOR) tablet 50 mg  50 mg Oral BID Antoine Poche, MD   50 mg at 01/07/16 0911  . ondansetron (ZOFRAN) tablet 4 mg  4 mg Oral  Q6H PRN Meredeth Ide, MD       Or  . ondansetron (ZOFRAN) injection 4 mg  4 mg Intravenous Q6H PRN Meredeth Ide, MD      . pantoprazole (PROTONIX) EC tablet 40 mg  40 mg Oral Daily Meredeth Ide, MD   40 mg at 01/07/16 0911  . sodium chloride flush (NS) 0.9 % injection 3 mL  3 mL Intravenous Q12H Meredeth Ide, MD   3 mL at 01/06/16 2200  . sodium chloride flush (NS) 0.9 % injection 3 mL  3 mL Intravenous PRN Meredeth Ide, MD         Discharge Medications: Please see discharge summary for a list of discharge medications.  Relevant Imaging Results:  Relevant Lab Results:   Additional Information SS # [id number]  Raye Sorrow, LCSW

## 2016-01-07 NOTE — Progress Notes (Signed)
Physical Therapy Treatment Patient Details Name: Michaela LeakGarnett M Bolda MRN: [id number] DOB: 01-22-1921 Today's Date: 01/07/2016    History of Present Illness 82yo black female who comes to [facility] after 2w LEE and weakness. PMH: LBBB, hypotension, HTN, GERD, R hip pain, L partial mastectomy.     PT Comments    Pt states that she is very fearful of falling   Follow Up Recommendations  SNF     Equipment Recommendations  None recommended by PT    Recommendations for Other Services       Precautions / Restrictions Precautions Precautions: Fall Restrictions Weight Bearing Restrictions: No    Mobility  Bed Mobility Overal bed mobility: Needs Assistance Bed Mobility: Supine to Sit;Sit to Supine     Supine to sit: Mod assist Sit to supine: Max assist      Transfers Overall transfer level: Needs assistance               General transfer comment: multiple attempts sit to stand unsuccessful  Ambulation/Gait      unable                   Cognition Arousal/Alertness: Awake/alert Behavior During Therapy: WFL for tasks assessed/performed Overall Cognitive Status: Within Functional Limits for tasks assessed                      Exercises General Exercises - Lower Extremity Ankle Circles/Pumps: 10 reps;AROM Quad Sets: AROM;10 reps Gluteal Sets: AROM;10 reps Heel Slides: AROM;5 reps;AAROM Hip ABduction/ADduction: 5 reps;AAROM    General Comments        Pertinent Vitals/Pain Pain Assessment: No/denies pain       Prior Function   ambulated with a cane.          PT Goals (current goals can now be found in the care plan section) Progress towards PT goals: Not progressing toward goals - comment    Frequency  Min 3X/week    PT Plan Current plan remains appropriate       End of Session Equipment Utilized During Treatment: Gait belt;Oxygen Activity Tolerance: Patient limited by fatigue Patient left: in bed;with call bell/phone within  reach;with family/visitor present     Time: [id number] PT Time Calculation (min) (ACUTE ONLY): 30 min  Charges:  $Therapeutic Exercise: 8-22 mins $Therapeutic Activity: 8-22 mins                    G CodesVirgina Organ:      Isami Mehra, PT CLT [phone] 01/07/2016, 4:43 PM

## 2016-01-07 NOTE — Clinical Social Work Note (Signed)
Clinical Social Work Assessment  Patient Details  Name: Michaela Tyler MRN: [id number] Date of Birth: 1921-06-16  Date of referral:  01/07/16               Reason for consult:  Discharge Planning, Facility Placement                Permission sought to share information with:  Case Manager, Magazine features editoracility Contact Representative, Family Supports Permission granted to share information::  Yes, Verbal Permission Granted  Name::        Agency::  [facility] (requested)   Relationship::  POA: Michaela DecemberSharon and husband Michaela FearingJames in Room  Contact Information:     Housing/Transportation Living arrangements for the past 2 months:  Single Family Home Source of Information:  Spouse, Power of Attorney Patient Interpreter Needed:  None Criminal Activity/Legal Involvement Pertinent to Current Situation/Hospitalization:  No - Comment as needed Significant Relationships:  Significant Other, Spouse, Other Family Members Lives with:  Spouse Do you feel safe going back to the place where you live?  No (needing SNF at DC) Need for family participation in patient care:  Yes (Comment) (POA in place for patient)  Care giving concerns:  Only concerns per POA is placement needs as patient was unsteady at home and high fall risk. Husband (in room at time of assessment) lives with pt, but due to age and physical/medical conditions: needs assistance as patient too acute to return home at this time.  SNF would be safest options per PT note for ST   Social Worker assessment / plan:  LCSW received consult from MD and PT.  Patient and family seen in room. [place] [facility] [place]. Explained role and procedure for placement. Will send insurance information with clinical referral. [place]. LCSW completed FL2 and [place] for patient. Need MD to sign FL2 as well as DC summary when time and medically stable.   Employment status:  Retired Product/process development scientistnsurance information:  Managed  Medicare PT Recommendations:  Skilled Nursing Facility Information / Referral to community resources:  Skilled Nursing Facility  Patient/Family's Response to care:  Agreeable  Patient/Family's Understanding of and Emotional Response to Diagnosis, Current Treatment, and Prognosis:  Husband and POA very involved in pt care, reasonable and understanding of plan of care at time of DC.  Questions all appropriate and excited to begin process prior to DC.    Emotional Assessment Appearance:  Appears stated age Attitude/Demeanor/Rapport:  Other (quiet, resting, ) Affect (typically observed):  Quiet, Calm Orientation:  Oriented to Self, Oriented to Place Alcohol / Substance use:  Not Applicable Psych involvement (Current and /or in the community):  No (Comment)  Discharge Needs  Concerns to be addressed:  Care Coordination Readmission within the last 30 days:  No Current discharge risk:  None Barriers to Discharge:  English as a second language teachernsurance Authorization, Continued Medical Work up   Michaela SorrowCoble, Michaela Chico N, LCSW 01/07/2016, 10:45 AM

## 2016-01-07 NOTE — Progress Notes (Signed)
Primary Cardiologist:Branch, Christiane HaJonathan MD  Cardiology Specific Problem List: 1. Atrial fib with RVR 2. Chronic LBBB 3. Hypotension   Subjective:   Denies chest pain.    Objective:   Temp:  [97.4 F (36.3 C)-97.9 F (36.6 C)] 97.4 F (36.3 C) (03/13 0650) Pulse Rate:  [116-117] 116 (03/13 0650) Resp:  [15-26] 22 (03/13 0650) BP: (94-114)/(63-93) 106/63 mmHg (03/13 0650) SpO2:  [96 %-100 %] 96 % (03/13 0650) Weight:  [269 lb 9.6 oz (122.29 kg)] 269 lb 9.6 oz (122.29 kg) (03/13 0650) Last BM Date: 01/03/16  Filed Weights   01/05/16 0400 01/06/16 0500 01/07/16 0650  Weight: 259 lb 11.2 oz (117.8 kg) 263 lb 0.1 oz (119.3 kg) 269 lb 9.6 oz (122.29 kg)    Intake/Output Summary (Last 24 hours) at 01/07/16 1056 Last data filed at 01/07/16 0700  Gross per 24 hour  Intake   1065 ml  Output   1625 ml  Net   -560 ml   Net i/O  ? Complete  Current report 4.1 L positive   Telemetry: Atrial fibrillation  90s to 120s  Currently in 90s     Exam:  General: No acute distress.  HEENT: Conjunctiva and lids normal, oropharynx clear.  Neck:  JVP is increased    Lungs: Rales are bases  Cardiac: Irreg irregular  S1, S2  No definite S3. No rub.   Abdomen: Normoactive bowel sounds, nontender, nondistended.  No hepatomeg  Extremities: 2+ ankle and feet pitting edema, distal pulses full.  Neuropsychiatric: Pt not very communative.  Mumbles answers     Lab Results:  Basic Metabolic Panel:  Recent Labs Lab 01/04/16 0432 01/05/16 0428 01/06/16 0426  NA 133* 132* 131*  K 3.9 3.9 4.0  CL 102 103 101  CO2 22 20* 22  GLUCOSE 105* 79 90  BUN 39* 41* 39*  CREATININE 2.50* 2.58* 2.36*  CALCIUM 8.4* 8.2* 8.4*    Liver Function Tests:  Recent Labs Lab 01/04/16 0432 01/05/16 0428 01/06/16 0426  AST 134* 153* 135*  ALT 119* 143* 154*  ALKPHOS 133* 142* 158*  BILITOT 1.1 1.5* 1.6*  PROT 5.5* 5.5* 6.0*  ALBUMIN 2.9* 2.9* 3.1*    CBC:  Recent Labs Lab  01/04/16 0432 01/06/16 0426  WBC 6.9 6.6  HGB 12.2 12.6  HCT 37.8 39.6  MCV 93.8 93.6  PLT 158 170      Medications:   Scheduled Medications: . ALPRAZolam  0.5 mg Oral BID  . aspirin EC  81 mg Oral Daily  . citalopram  10 mg Oral QHS  . enoxaparin (LOVENOX) injection  30 mg Subcutaneous Q24H  . levothyroxine  125 mcg Oral QAC breakfast  . loratadine  10 mg Oral Daily  . magnesium oxide  400 mg Oral BID  . metoprolol tartrate  50 mg Oral BID  . pantoprazole  40 mg Oral Daily  . sodium chloride flush  3 mL Intravenous Q12H    Infusions: . sodium chloride 75 mL/hr at 01/07/16 1050    PRN Medications: sodium chloride, acetaminophen **OR** acetaminophen, HYDROcodone-acetaminophen, ondansetron **OR** ondansetron (ZOFRAN) IV, sodium chloride flush   Assessment and Plan:   1. Atrial fib with RVR: Could not tolerate amiodarone due to hypotensive response. She has been on metoprolol 50 mg BID, with some improvement in HR,  Better this AM WOuld continue for now. Pt is on low dose lovenox  ? If she is a candidate for anticoagulation  (coumadin)  Would transition to  heparin for now    2. Systolic Dysfunction: EF of 45%-50%.RVEF is mild to moderately dperessed  Will d/c IV fluids as she has evidence of fluid overload with LEE. WOuld give IV lasix once  Follow BP and UO    I have reviewed echo for this admit and compared to echo from Sept 2016  I think last week's echo LVEF is worse that reported and RVEF is also down.  Overall pts presentation with elevated LFTS, low BP may go along with low flow state.  Would try diruesis now and follo. PE is not out of differential with worsening RV function. If going to be anticoagulated, dx would not change treatment  (Note D dimer in Sept elevated by CT neg for PE).    3  CKD  Follow urine output and Cr closely with lasix use   4.  Elevated LFTs  Prob for Decreased LV/RV systolic function with hepatic congestion.     Prognosis guarded    Bettey Mare. Lawrence NP [facility]  01/07/2016, 10:56 AM   Pt seen and examined.  I have amended noted above (PE, assessment)  to reflect my findings.  Plans as noted    Dietrich Pates

## 2016-01-07 NOTE — Progress Notes (Signed)
[facility] been [facility]. Not ready for DC today, however will complete DC once medically stable.  Husband and POA Michaela DecemberSharon notified of bed acceptance.  Agreeable with plan.  MD please sign FL2.  Deretha EmoryHannah Cyncere Ruhe LCSW, MSW

## 2016-01-07 NOTE — Progress Notes (Addendum)
ANTICOAGULATION CONSULT NOTE - Initial Consult  Pharmacy Consult for heparin Indication: atrial fibrillation  No Known Allergies  Patient Measurements: Height: 5\' 8"  (172.7 cm) Weight: 269 lb 9.6 oz (122.29 kg) IBW/kg (Calculated) : 63.9 Heparin Dosing Weight: 90 kg  Vital Signs: Temp: 97.5 F (36.4 C) (03/13 1421) Temp Source: Oral (03/13 1421) BP: 120/83 mmHg (03/13 1421) Pulse Rate: 74 (03/13 1421)  Labs:  Recent Labs  01/05/16 0428 01/06/16 0426 01/07/16 1342  HGB  --  12.6  --   HCT  --  39.6  --   PLT  --  170  --   LABPROT  --   --  14.9  INR  --   --  1.15  CREATININE 2.58* 2.36*  --     Estimated Creatinine Clearance: 21 mL/min (by C-G formula based on Cr of 2.36).   Medical History: Past Medical History  Diagnosis Date  . Hypertension   . High cholesterol   . GERD (gastroesophageal reflux disease)   . Arthritis   . Anxiety   . Depression   . Cataract   . Hearing reduced   . PONV (postoperative nausea and vomiting)   . H/O left breast biopsy     NO CANCER  . Low back pain   . Chronic right hip pain     Medications:  Prescriptions prior to admission  Medication Sig Dispense Refill Last Dose  . ALPRAZolam (XANAX) 0.5 MG tablet Take 0.5 mg by mouth 2 (two) times daily. For anxiety   12/30/2015 at Unknown time  . amLODipine (NORVASC) 5 MG tablet Take 5 mg by mouth daily.   12/30/2015 at Unknown time  . aspirin EC 81 MG tablet Take 81 mg by mouth daily.   12/30/2015 at Unknown time  . atorvastatin (LIPITOR) 40 MG tablet Take 40 mg by mouth daily.   12/30/2015 at Unknown time  . cetirizine (ZYRTEC) 10 MG tablet Take 10 mg by mouth daily.   12/30/2015 at Unknown time  . citalopram (CELEXA) 10 MG tablet Take 10 mg by mouth at bedtime.   3 12/30/2015 at Unknown time  . ferrous sulfate 325 (65 FE) MG tablet Take 325 mg by mouth daily with breakfast.   12/30/2015 at Unknown time  . furosemide (LASIX) 20 MG tablet Take 20 mg by mouth daily.   12/30/2015 at Unknown time   . HYDROcodone-acetaminophen (NORCO/VICODIN) 5-325 MG per tablet Take 1 tablet by mouth 2 (two) times daily as needed for moderate pain.   0 12/30/2015 at Unknown time  . levothyroxine (SYNTHROID, LEVOTHROID) 100 MCG tablet Take 100 mcg by mouth daily before breakfast.   12/30/2015 at Unknown time  . losartan (COZAAR) 50 MG tablet Take 50 mg by mouth daily.   12/30/2015 at Unknown time  . methocarbamol (ROBAXIN) 500 MG tablet Take 500 mg by mouth daily as needed for muscle spasms.   Past Week at Unknown time  . metoprolol succinate (TOPROL-XL) 50 MG 24 hr tablet Take 1 tablet (50 mg total) by mouth daily. 30 tablet 3 12/30/2015 at Unknown time  . omeprazole (PRILOSEC) 20 MG capsule Take 20 mg by mouth daily.  3 12/30/2015 at Unknown time    Assessment: 82 yo lady to start heparin for afib.  She received lovenox 30mg  sq last night.  Baseline LFTs elevated, INR 1.15. CBC normal Goal of Therapy:  Heparin level 0.3-0.7 units/ml Monitor platelets by anticoagulation protocol: Yes   Plan:  Heparin 4000 unit bolus and drip at  1250 units/hr Check heparin level in ~8 hours Daily heparin level and CBC while on heparin  Thanks for allowing pharmacy to be a part of this patient's care.  Talbert Cage, PharmD Clinical Pharmacist 01/07/2016,5:05 PM  Addum:  Initial heparin level is therapeutic.  Will continue drip at present rate and recheck HL later today.

## 2016-01-07 NOTE — Progress Notes (Signed)
Subjective: Patient feels better today but remained weak. No chest pain. Her heart rate is running in the range of 90-120. Her B/P is systolic around 562. She is on Betablocker. I have talked with her niece who is her POA about her condition and about discharge plan. Objective: Vital signs in last 24 hours: Temp:  [97.4 F (36.3 C)-97.9 F (36.6 C)] 97.4 F (36.3 C) (03/13 0650) Pulse Rate:  [116-117] 116 (03/13 0650) Resp:  [15-26] 22 (03/13 0650) BP: (94-114)/(63-93) 106/63 mmHg (03/13 0650) SpO2:  [96 %-100 %] 96 % (03/13 0650) Weight:  [122.29 kg (269 lb 9.6 oz)] 122.29 kg (269 lb 9.6 oz) (03/13 0650) Weight change: 2.99 kg (6 lb 9.5 oz) Last BM Date: 01/03/16  Intake/Output from previous day: 03/12 0701 - 03/13 0700 In: 1455 [P.O.:480; I.V.:975] Out: 1625 [Urine:1625]  PHYSICAL EXAM General appearance: alert, cooperative and moderately obese Resp: diminished breath sounds bilaterally and rhonchi bilaterally Cardio: regularly irregular rhythm GI: soft, non-tender; bowel sounds normal; no masses,  no organomegaly Extremities: edema 3+++  Lab Results:  Results for orders placed or performed during the hospital encounter of 12/30/15 (from the past 48 hour(s))  CBC     Status: Abnormal   Collection Time: 01/06/16  4:26 AM  Result Value Ref Range   WBC 6.6 4.0 - 10.5 K/uL   RBC 4.23 3.87 - 5.11 MIL/uL   Hemoglobin 12.6 12.0 - 15.0 g/dL   HCT 39.6 36.0 - 46.0 %   MCV 93.6 78.0 - 100.0 fL   MCH 29.8 26.0 - 34.0 pg   MCHC 31.8 30.0 - 36.0 g/dL   RDW 17.2 (H) 11.5 - 15.5 %   Platelets 170 150 - 400 K/uL  Comprehensive metabolic panel     Status: Abnormal   Collection Time: 01/06/16  4:26 AM  Result Value Ref Range   Sodium 131 (L) 135 - 145 mmol/L   Potassium 4.0 3.5 - 5.1 mmol/L   Chloride 101 101 - 111 mmol/L   CO2 22 22 - 32 mmol/L   Glucose, Bld 90 65 - 99 mg/dL   BUN 39 (H) 6 - 20 mg/dL   Creatinine, Ser 2.36 (H) 0.44 - 1.00 mg/dL   Calcium 8.4 (L) 8.9 - 10.3  mg/dL   Total Protein 6.0 (L) 6.5 - 8.1 g/dL   Albumin 3.1 (L) 3.5 - 5.0 g/dL   AST 135 (H) 15 - 41 U/L   ALT 154 (H) 14 - 54 U/L   Alkaline Phosphatase 158 (H) 38 - 126 U/L   Total Bilirubin 1.6 (H) 0.3 - 1.2 mg/dL   GFR calc non Af Amer 17 (L) >60 mL/min   GFR calc Af Amer 19 (L) >60 mL/min    Comment: (NOTE) The eGFR has been calculated using the CKD EPI equation. This calculation has not been validated in all clinical situations. eGFR's persistently <60 mL/min signify possible Chronic Kidney Disease.    Anion gap 8 5 - 15    ABGS No results for input(s): PHART, PO2ART, TCO2, HCO3 in the last 72 hours.  Invalid input(s): PCO2 CULTURES Recent Results (from the past 240 hour(s))  MRSA PCR Screening     Status: None   Collection Time: 12/30/15 10:38 PM  Result Value Ref Range Status   MRSA by PCR NEGATIVE NEGATIVE Final    Comment:        The GeneXpert MRSA Assay (FDA approved for NASAL specimens only), is one component of a comprehensive MRSA colonization surveillance program.  It is not intended to diagnose MRSA infection nor to guide or monitor treatment for MRSA infections.   Urine culture     Status: None   Collection Time: 12/31/15  8:55 AM  Result Value Ref Range Status   Specimen Description URINE, CLEAN CATCH  Final   Special Requests NONE  Final   Culture   Final    NO GROWTH 1 DAY Performed at [facility]    Report Status 01/01/2016 FINAL  Final   Studies/Results: No results found.  Medications: I have reviewed the patient's current medications.  Assesment:   Active Problems:   Atrial fibrillation (HCC)   AKI (acute kidney injury) ([place]) hypertension DJD Hypothyroidism Acute on Chronic systolic CHF Abnormal LFT  Plan: Medications reviewed Will continue current treatment as per cardiology recommendation Physical therapy Patient  Needs physical therapy on discharge probably at skilled nursing. She lives with her 80 years old who  discharge yesterday after he was treated for dizziness. He will not be able to care for her at home.    LOS: 5 days   Akia Desroches 01/07/2016, 8:15 AM

## 2016-01-07 NOTE — Clinical Social Work Placement (Addendum)
   CLINICAL SOCIAL WORK PLACEMENT  NOTE  Date:  01/07/2016  Patient Details  Name: Michaela Tyler MRN: [id number] Date of Birth: 08-20-1921  Clinical Social Work is seeking post-discharge placement for this patient at the Skilled  Nursing Facility level of care (*CSW will initial, date and re-position this form in  chart as items are completed):  Yes   Patient/family provided with [place] Clinical Social Work Department's list of facilities offering this level of care within the geographic area requested by the patient (or if unable, by the patient's family).  Yes   Patient/family informed of their freedom to choose among providers that offer the needed level of care, that participate in Medicare, Medicaid or managed care program needed by the patient, have an available bed and are willing to accept the patient.  Yes   Patient/family informed of [facility]'s ownership interest in [facility] and [facility], as well as of the fact that they are under no obligation to receive care at these facilities.  PASRR submitted to EDS on 01/07/16     PASRR number received on    01/07/2016  Existing PASRR number confirmed on       FL2 transmitted to all facilities in geographic area requested by pt/family on 01/07/16     FL2 transmitted to all facilities within larger geographic area on       Patient informed that his/her managed care company has contracts with or will negotiate with certain facilities, including the following:            Patient/family informed of bed offers received. 01/07/2016  Patient [facility] bed at       [facility]  Physician recommends and patient chooses bed at     SNF Patient to be transferred to   on  .  01/08/2016  [facility]   Patient family notified on   of transfer.  Husband and POA  Name of family member notified:      Michaela Tyler  PHYSICIAN Please sign FL2     Additional  Comment:    _______________________________________________ Raye Sorrowoble, Joda Braatz N, LCSW 01/07/2016, 11:47 AM

## 2016-01-07 NOTE — Consult Note (Signed)
Referring Provider: Dr. Felecia ShellingFanta  Primary Care Physician:  Avon GullyFANTA,TESFAYE, MD Primary Gastroenterologist:  Dr. Karilyn Cotaehman   Date of Admission: 12/30/15 Date of Consultation: 01/07/16  Reason for Consultation:  Abnormal LFTs   HPI:  Michaela Tyler is a 82 y.o. year old female admitted with acute on chronic diastolic heart failure, acute kidney injury in setting of taking lasix and HCTZ inadvertently, afib with RVR. GI consulted due to elevated LFTs. US abdomen without cholelithiasis or cholecystitis, no biliary ductal dilation. She received IV amiodarone during admission, and this was stopped on 3/9. Statin held starting 3/10. She has had difficulty with hypotension during admission. Transaminases appeared to trend up on admission.   No abdominal pain. Had dry heaves the other day. Poor appetite. No weight loss. Has had a few bouts of nausea prior to admission. Remote colonoscopy by Dr. Katrinka BlazingSmith. No prior EGD. Husband at bedside. Complains of chronic lower extremity edema that has worsened during hospitalization. No known history of liver disease. No family history of liver disease. EF 45-50%. Unknown baseline LFTs.   Past Medical History  Diagnosis Date  . Hypertension   . High cholesterol   . GERD (gastroesophageal reflux disease)   . Arthritis   . Anxiety   . Depression   . Cataract   . Hearing reduced   . PONV (postoperative nausea and vomiting)   . H/O left breast biopsy     NO CANCER  . Low back pain   . Chronic right hip pain     Past Surgical History  Procedure Laterality Date  . Total knee arthroplasty  2008    left, [facility]  . Elbow surgery      left  . Abdominal hysterectomy    . Knee arthroscopy      right  . Breast lumpectomy  1998    left  . Cataract extraction w/phaco  03/23/2012    Procedure: CATARACT EXTRACTION PHACO AND INTRAOCULAR LENS PLACEMENT (IOC);  Surgeon: [name], MD;  Location: [facility];  Service: Ophthalmology;  Laterality: Right;  CDE:12.73  . Cataract  extraction w/phaco  04/20/2012    Procedure: CATARACT EXTRACTION PHACO AND INTRAOCULAR LENS PLACEMENT (IOC);  Surgeon: [name], MD;  Location: [facility];  Service: Ophthalmology;  Laterality: Left;  CDE:7.70  . Yag laser application Left 11/07/2014    Procedure: YAG LASER APPLICATION;  Surgeon: [name], MD;  Location: [facility];  Service: Ophthalmology;  Laterality: Left;  left  . Yag laser application Right 11/21/2014    Procedure: YAG LASER APPLICATION;  Surgeon: [name], MD;  Location: [facility];  Service: Ophthalmology;  Laterality: Right;  right    Prior to Admission medications   Medication Sig Start Date End Date Taking? Authorizing Provider  ALPRAZolam Prudy Feeler(XANAX) 0.5 MG tablet Take 0.5 mg by mouth 2 (two) times daily. For anxiety   Yes Historical Provider, MD  amLODipine (NORVASC) 5 MG tablet Take 5 mg by mouth daily.   Yes Historical Provider, MD  aspirin EC 81 MG tablet Take 81 mg by mouth daily.   Yes Historical Provider, MD  atorvastatin (LIPITOR) 40 MG tablet Take 40 mg by mouth daily.   Yes Historical Provider, MD  cetirizine (ZYRTEC) 10 MG tablet Take 10 mg by mouth daily.   Yes Historical Provider, MD  citalopram (CELEXA) 10 MG tablet Take 10 mg by mouth at bedtime.  09/15/14  Yes Historical Provider, MD  ferrous sulfate 325 (65 FE) MG tablet Take 325 mg  by mouth daily with breakfast.   Yes Historical Provider, MD  furosemide (LASIX) 20 MG tablet Take 20 mg by mouth daily.   Yes Historical Provider, MD  HYDROcodone-acetaminophen (NORCO/VICODIN) 5-325 MG per tablet Take 1 tablet by mouth 2 (two) times daily as needed for moderate pain.  09/28/14  Yes Historical Provider, MD  levothyroxine (SYNTHROID, LEVOTHROID) 100 MCG tablet Take 100 mcg by mouth daily before breakfast.   Yes Historical Provider, MD  losartan (COZAAR) 50 MG tablet Take 50 mg by mouth daily.   Yes Historical Provider, MD  methocarbamol (ROBAXIN) 500 MG tablet Take 500 mg by mouth daily as needed for  muscle spasms.   Yes Historical Provider, MD  metoprolol succinate (TOPROL-XL) 50 MG 24 hr tablet Take 1 tablet (50 mg total) by mouth daily. 07/15/15  Yes Avon Gully, MD  omeprazole (PRILOSEC) 20 MG capsule Take 20 mg by mouth daily. 10/07/14  Yes Historical Provider, MD    Current Facility-Administered Medications  Medication Dose Route Frequency Provider Last Rate Last Dose  . 0.45 % sodium chloride infusion   Intravenous Continuous Jodelle Gross, NP 75 mL/hr at 01/06/16 2112    . 0.9 %  sodium chloride infusion  250 mL Intravenous PRN Meredeth Ide, MD 10 mL/hr at 01/06/16 2059 250 mL at 01/06/16 2059  . acetaminophen (TYLENOL) tablet 650 mg  650 mg Oral Q6H PRN Meredeth Ide, MD       Or  . acetaminophen (TYLENOL) suppository 650 mg  650 mg Rectal Q6H PRN Meredeth Ide, MD      . ALPRAZolam Prudy Feeler) tablet 0.5 mg  0.5 mg Oral BID Meredeth Ide, MD   0.5 mg at 01/07/16 0911  . aspirin EC tablet 81 mg  81 mg Oral Daily Meredeth Ide, MD   81 mg at 01/07/16 0911  . citalopram (CELEXA) tablet 10 mg  10 mg Oral QHS Meredeth Ide, MD   10 mg at 01/06/16 2114  . enoxaparin (LOVENOX) injection 30 mg  30 mg Subcutaneous Q24H Meredeth Ide, MD   30 mg at 01/06/16 2112  . HYDROcodone-acetaminophen (NORCO/VICODIN) 5-325 MG per tablet 1 tablet  1 tablet Oral BID PRN Meredeth Ide, MD   1 tablet at 01/06/16 0100  . levothyroxine (SYNTHROID, LEVOTHROID) tablet 125 mcg  125 mcg Oral QAC breakfast Avon Gully, MD   125 mcg at 01/07/16 0911  . loratadine (CLARITIN) tablet 10 mg  10 mg Oral Daily Meredeth Ide, MD   10 mg at 01/07/16 0911  . magnesium oxide (MAG-OX) tablet 400 mg  400 mg Oral BID Avon Gully, MD   400 mg at 01/07/16 0911  . metoprolol (LOPRESSOR) tablet 50 mg  50 mg Oral BID Antoine Poche, MD   50 mg at 01/07/16 0911  . ondansetron (ZOFRAN) tablet 4 mg  4 mg Oral Q6H PRN Meredeth Ide, MD       Or  . ondansetron (ZOFRAN) injection 4 mg  4 mg Intravenous Q6H PRN Meredeth Ide, MD      .  pantoprazole (PROTONIX) EC tablet 40 mg  40 mg Oral Daily Meredeth Ide, MD   40 mg at 01/07/16 0911  . sodium chloride flush (NS) 0.9 % injection 3 mL  3 mL Intravenous Q12H Meredeth Ide, MD   3 mL at 01/06/16 2200  . sodium chloride flush (NS) 0.9 % injection 3 mL  3 mL Intravenous PRN Meredeth Ide,  MD        Allergies as of 12/30/2015  . (No Known Allergies)    Family History  Problem Relation Age of Onset  . Colon cancer Neg Hx   . Liver disease Neg Hx     Social History   Social History  . Marital Status: Married    Spouse Name: N/A  . Number of Children: N/A  . Years of Education: N/A   Occupational History  . Not on file.   Social History Main Topics  . Smoking status: Never Smoker   . Smokeless tobacco: Not on file  . Alcohol Use: No  . Drug Use: No  . Sexual Activity: Not on file   Other Topics Concern  . Not on file   Social History Narrative    Review of Systems: Gen: Denies fever, chills, loss of appetite, change in weight or weight loss CV: Denies chest pain, heart palpitations, syncope, edema  Resp: +DOE GI: see HPI  GU : Denies urinary burning, urinary frequency, urinary incontinence.  MS: + lower extremity edema  Derm: Denies rash, itching, dry skin Psych: +anxiety, confusion Heme: Denies bruising, bleeding, and enlarged lymph nodes.  Physical Exam: Vital signs in last 24 hours: Temp:  [97.4 F (36.3 C)-97.9 F (36.6 C)] 97.4 F (36.3 C) (03/13 0650) Pulse Rate:  [116-117] 116 (03/13 0650) Resp:  [15-26] 22 (03/13 0650) BP: (94-114)/(63-93) 106/63 mmHg (03/13 0650) SpO2:  [96 %-100 %] 96 % (03/13 0650) Weight:  [269 lb 9.6 oz (122.29 kg)] 269 lb 9.6 oz (122.29 kg) (03/13 0650) Last BM Date: 01/03/16 General:   Alert,  Well-developed, well-nourished, pleasant and cooperative in NAD, appears younger than stated age.  Head:  Normocephalic and atraumatic. Eyes:  Sclera clear, no icterus.   Conjunctiva pink. Ears:  Normal auditory  acuity. Nose:  No deformity, discharge,  or lesions. Mouth:  No deformity or lesions, dentition normal. Lungs:  Clear throughout to auscultation.    Heart:  S1 S2 present, irregularly irregular  Abdomen:  Soft, nontender and nondistended. No masses, hepatosplenomegaly or hernias noted. Normal bowel sounds, without guarding, and without rebound.   Rectal:  Deferred  Msk:  Slight kyphosis  Extremities:  3+ pitting lower extremity edema  Neurologic:  Alert and  Oriented to person, place, situation. Stated 75s when asked the date but very quickly corrected herself to 2017.  Skin:  Intact without significant lesions or rashes. Psych:  Alert and cooperative. Normal mood and affect.  Intake/Output from previous day: 03/12 0701 - 03/13 0700 In: 1455 [P.O.:480; I.V.:975] Out: 1625 [Urine:1625] Intake/Output this shift:    Lab Results:  Recent Labs  01/06/16 0426  WBC 6.6  HGB 12.6  HCT 39.6  PLT 170   BMET  Recent Labs  01/05/16 0428 01/06/16 0426  NA 132* 131*  K 3.9 4.0  CL 103 101  CO2 20* 22  GLUCOSE 79 90  BUN 41* 39*  CREATININE 2.58* 2.36*  CALCIUM 8.2* 8.4*   LFT  Recent Labs  01/05/16 0428 01/06/16 0426  PROT 5.5* 6.0*  ALBUMIN 2.9* 3.1*  AST 153* 135*  ALT 143* 154*  ALKPHOS 142* 158*  BILITOT 1.5* 1.6*    Studies/Results: US abdomen 01/04/16  Impression: 82 year old female admitted with acute on chronic diastolic heart failure, afib with RVR, slowly increasing LFTs in a mixed pattern, query secondary to hepatic congestion. US abdomen without concerning findings. Statin and amiodarone both held. Unknown baseline for LFTs. Will check INR, recheck HFP  today, and continue to follow with you.  Plan: HFP, INR, further labs depending on LFT pattern today Diuresis per cardiology Will continue to follow with you   Nira Retort, ANP-BC [facility]       LOS: 5 days    01/07/2016, 9:45 AM  GI attending note: Patient examined.  History obtained from patient's husband was at bedside. No prior history of liver disease. Patient does not have hepatomegaly on examination or pulsatile liver. Nevertheless elevated transaminases, mildly elevated alkaline phosphatase and bilirubin appears to be secondary to congestive hepatopathy. Echo results from this admission reveals worsening cardiac function when compared with prior study of September 2016. Would expect to see improvement in LFTs as CHF improves. Will monitor LFTs.

## 2016-01-07 NOTE — Clinical Social Work Placement (Signed)
   CLINICAL SOCIAL WORK PLACEMENT  NOTE  Date:  01/07/2016  Patient Details  Name: Michaela Tyler MRN: [id number] Date of Birth: 23-Jan-1921  Clinical Social Work is seeking post-discharge placement for this patient at the Skilled  Nursing Facility level of care (*CSW will initial, date and re-position this form in  chart as items are completed):  Yes   Patient/family provided with [facility] Clinical Social Work Department's list of facilities offering this level of care within the geographic area requested by the patient (or if unable, by the patient's family).  Yes   Patient/family informed of their freedom to choose among providers that offer the needed level of care, that participate in Medicare, Medicaid or managed care program needed by the patient, have an available bed and are willing to accept the patient.  Yes   Patient/family informed of 's ownership interest in [facility] and [facility], as well as of the fact that they are under no obligation to receive care at these facilities.  PASRR submitted to EDS on 01/07/16     PASRR number received on       Existing PASRR number confirmed on       FL2 transmitted to all facilities in geographic area requested by pt/family on 01/07/16     FL2 transmitted to all facilities within larger geographic area on       Patient informed that his/her managed care company has contracts with or will negotiate with certain facilities, including the following:            Patient/family informed of bed offers received.  Patient chooses bed at       Physician recommends and patient chooses bed at      Patient to be transferred to   on  .  Patient to be transferred to facility by       Patient family notified on   of transfer.  Name of family member notified:        PHYSICIAN Please sign FL2     Additional Comment:    _______________________________________________ Michaela Tyler, Meosha Castanon N, LCSW 01/07/2016, 10:48  AM

## 2016-01-08 ENCOUNTER — Inpatient Hospital Stay
Admission: RE | Admit: 2016-01-08 | Discharge: 2016-01-22 | Disposition: A | Discharge status: Nursing Home (Includes ICF) | Payer: Medicare Other | Source: Ambulatory Visit | Attending: Internal Medicine | Admitting: Internal Medicine

## 2016-01-08 DIAGNOSIS — R296 Repeated falls: Secondary | ICD-10-CM | POA: Diagnosis not present

## 2016-01-08 DIAGNOSIS — I5041 Acute combined systolic (congestive) and diastolic (congestive) heart failure: Secondary | ICD-10-CM | POA: Diagnosis not present

## 2016-01-08 DIAGNOSIS — R7989 Other specified abnormal findings of blood chemistry: Secondary | ICD-10-CM | POA: Diagnosis not present

## 2016-01-08 DIAGNOSIS — M6281 Muscle weakness (generalized): Secondary | ICD-10-CM | POA: Diagnosis not present

## 2016-01-08 DIAGNOSIS — I5042 Chronic combined systolic (congestive) and diastolic (congestive) heart failure: Secondary | ICD-10-CM | POA: Diagnosis not present

## 2016-01-08 DIAGNOSIS — E785 Hyperlipidemia, unspecified: Secondary | ICD-10-CM | POA: Diagnosis not present

## 2016-01-08 DIAGNOSIS — Z6835 Body mass index (BMI) 35.0-35.9, adult: Secondary | ICD-10-CM | POA: Diagnosis not present

## 2016-01-08 DIAGNOSIS — E559 Vitamin D deficiency, unspecified: Secondary | ICD-10-CM | POA: Diagnosis not present

## 2016-01-08 DIAGNOSIS — R488 Other symbolic dysfunctions: Secondary | ICD-10-CM | POA: Diagnosis not present

## 2016-01-08 DIAGNOSIS — K219 Gastro-esophageal reflux disease without esophagitis: Secondary | ICD-10-CM | POA: Diagnosis not present

## 2016-01-08 DIAGNOSIS — Z96652 Presence of left artificial knee joint: Secondary | ICD-10-CM | POA: Diagnosis not present

## 2016-01-08 DIAGNOSIS — E039 Hypothyroidism, unspecified: Secondary | ICD-10-CM | POA: Diagnosis not present

## 2016-01-08 DIAGNOSIS — E8889 Other specified metabolic disorders: Secondary | ICD-10-CM | POA: Diagnosis not present

## 2016-01-08 DIAGNOSIS — T502X5A Adverse effect of carbonic-anhydrase inhibitors, benzothiadiazides and other diuretics, initial encounter: Secondary | ICD-10-CM | POA: Diagnosis not present

## 2016-01-08 DIAGNOSIS — Z8679 Personal history of other diseases of the circulatory system: Secondary | ICD-10-CM | POA: Diagnosis not present

## 2016-01-08 DIAGNOSIS — R278 Other lack of coordination: Secondary | ICD-10-CM | POA: Diagnosis not present

## 2016-01-08 DIAGNOSIS — I890 Lymphedema, not elsewhere classified: Secondary | ICD-10-CM | POA: Diagnosis not present

## 2016-01-08 DIAGNOSIS — E669 Obesity, unspecified: Secondary | ICD-10-CM | POA: Diagnosis present

## 2016-01-08 DIAGNOSIS — I272 Other secondary pulmonary hypertension: Secondary | ICD-10-CM | POA: Diagnosis not present

## 2016-01-08 DIAGNOSIS — Z7982 Long term (current) use of aspirin: Secondary | ICD-10-CM | POA: Diagnosis not present

## 2016-01-08 DIAGNOSIS — I482 Chronic atrial fibrillation: Secondary | ICD-10-CM | POA: Diagnosis not present

## 2016-01-08 DIAGNOSIS — Z87440 Personal history of urinary (tract) infections: Secondary | ICD-10-CM | POA: Diagnosis not present

## 2016-01-08 DIAGNOSIS — I130 Hypertensive heart and chronic kidney disease with heart failure and stage 1 through stage 4 chronic kidney disease, or unspecified chronic kidney disease: Secondary | ICD-10-CM | POA: Diagnosis not present

## 2016-01-08 DIAGNOSIS — M1990 Unspecified osteoarthritis, unspecified site: Secondary | ICD-10-CM | POA: Diagnosis not present

## 2016-01-08 DIAGNOSIS — E7800 Pure hypercholesterolemia, unspecified: Secondary | ICD-10-CM | POA: Diagnosis not present

## 2016-01-08 DIAGNOSIS — R000 Tachycardia, unspecified: Secondary | ICD-10-CM | POA: Diagnosis not present

## 2016-01-08 DIAGNOSIS — D649 Anemia, unspecified: Secondary | ICD-10-CM | POA: Diagnosis not present

## 2016-01-08 DIAGNOSIS — N183 Chronic kidney disease, stage 3 (moderate): Secondary | ICD-10-CM | POA: Diagnosis not present

## 2016-01-08 DIAGNOSIS — I5022 Chronic systolic (congestive) heart failure: Secondary | ICD-10-CM | POA: Diagnosis not present

## 2016-01-08 DIAGNOSIS — N390 Urinary tract infection, site not specified: Secondary | ICD-10-CM | POA: Diagnosis not present

## 2016-01-08 DIAGNOSIS — I10 Essential (primary) hypertension: Secondary | ICD-10-CM | POA: Diagnosis not present

## 2016-01-08 DIAGNOSIS — E871 Hypo-osmolality and hyponatremia: Secondary | ICD-10-CM | POA: Diagnosis not present

## 2016-01-08 DIAGNOSIS — E875 Hyperkalemia: Secondary | ICD-10-CM | POA: Diagnosis not present

## 2016-01-08 DIAGNOSIS — N178 Other acute kidney failure: Secondary | ICD-10-CM | POA: Diagnosis not present

## 2016-01-08 DIAGNOSIS — I4891 Unspecified atrial fibrillation: Secondary | ICD-10-CM | POA: Diagnosis not present

## 2016-01-08 DIAGNOSIS — N179 Acute kidney failure, unspecified: Secondary | ICD-10-CM | POA: Diagnosis not present

## 2016-01-08 DIAGNOSIS — I481 Persistent atrial fibrillation: Secondary | ICD-10-CM | POA: Diagnosis not present

## 2016-01-08 DIAGNOSIS — N184 Chronic kidney disease, stage 4 (severe): Secondary | ICD-10-CM | POA: Diagnosis not present

## 2016-01-08 LAB — CBC
HCT: 38.7 % (ref 36.0–46.0)
Hemoglobin: 12.5 g/dL (ref 12.0–15.0)
MCH: 30.6 pg (ref 26.0–34.0)
MCHC: 32.3 g/dL (ref 30.0–36.0)
MCV: 94.6 fL (ref 78.0–100.0)
PLATELETS: 169 10*3/uL (ref 150–400)
RBC: 4.09 MIL/uL (ref 3.87–5.11)
RDW: 17.1 % — AB (ref 11.5–15.5)
WBC: 6 10*3/uL (ref 4.0–10.5)

## 2016-01-08 LAB — HEPARIN LEVEL (UNFRACTIONATED): Heparin Unfractionated: 0.58 IU/mL (ref 0.30–0.70)

## 2016-01-08 MED ORDER — HYDROCODONE-ACETAMINOPHEN 5-325 MG PO TABS: 1.0000 | ORAL_TABLET | Freq: Two times a day (BID) | ORAL | Status: DC | PRN

## 2016-01-08 MED ORDER — LEVOTHYROXINE SODIUM 125 MCG PO TABS: 125.0000 ug | ORAL_TABLET | Freq: Every day | ORAL | Status: DC

## 2016-01-08 MED ORDER — METOPROLOL TARTRATE 50 MG PO TABS: 50.0000 mg | ORAL_TABLET | Freq: Two times a day (BID) | ORAL | Status: DC

## 2016-01-08 MED ORDER — MAGNESIUM OXIDE 400 (241.3 MG) MG PO TABS: 400.0000 mg | ORAL_TABLET | Freq: Two times a day (BID) | ORAL | Status: AC

## 2016-01-08 MED ORDER — ALPRAZOLAM 0.5 MG PO TABS: 0.5000 mg | ORAL_TABLET | Freq: Two times a day (BID) | ORAL | Status: DC

## 2016-01-08 NOTE — Care Management Important Message (Signed)
Important Message  Patient Details  Name: Michaela Tyler MRN: [id number] Date of Birth: 08-02-1921   Medicare Important Message Given:  Yes    Adonis HugueninBerkhead, [name], RN 01/08/2016, 9:51 AM

## 2016-01-08 NOTE — Progress Notes (Addendum)
Patient medically stable for DC to SNF: with facility chosen:  [facility] Husband and POA niece aware of placement and agreeable. [facility] and [facility]. Husband also aware and he will meet patient at SNF.   RN aware and calling report/transporting patient.  [facility] clinicals [facility] to [facility] and patient to be transported by RN staff when ready.  No other needs at this time.  DC to SNF.  Deretha EmoryHannah Ailey Wessling LCSW, MSW

## 2016-01-08 NOTE — Discharge Summary (Signed)
Physician Discharge Summary  Patient ID: Michaela Tyler MRN: [id number] DOB/AGE: 80-Mar-1922 80 y.o. Primary Care Physician:Kenzee Bassin, MD Admit date: 12/30/2015 Discharge date: 01/08/2016    Discharge Diagnoses:   Active Problems:   Atrial fibrillation (HCC)   AKI (acute kidney injury) (HCC)   Abnormal liver function test   Atrial fibrillation with rapid ventricular response (HCC)   Elevated troponin   Arterial hypotension   Acute on chronic systolic CHF (congestive heart failure) (HCC)     Medication List    STOP taking these medications        amLODipine 5 MG tablet  Commonly known as:  NORVASC     methocarbamol 500 MG tablet  Commonly known as:  ROBAXIN     metoprolol succinate 50 MG 24 hr tablet  Commonly known as:  TOPROL-XL      TAKE these medications        ALPRAZolam 0.5 MG tablet  Commonly known as:  XANAX  Take 1 tablet (0.5 mg total) by mouth 2 (two) times daily. For anxiety     aspirin EC 81 MG tablet  Take 81 mg by mouth daily.     atorvastatin 40 MG tablet  Commonly known as:  LIPITOR  Take 40 mg by mouth daily.     cetirizine 10 MG tablet  Commonly known as:  ZYRTEC  Take 10 mg by mouth daily.     citalopram 10 MG tablet  Commonly known as:  CELEXA  Take 10 mg by mouth at bedtime.     ferrous sulfate 325 (65 FE) MG tablet  Take 325 mg by mouth daily with breakfast.     furosemide 20 MG tablet  Commonly known as:  LASIX  Take 20 mg by mouth daily.     HYDROcodone-acetaminophen 5-325 MG tablet  Commonly known as:  NORCO/VICODIN  Take 1 tablet by mouth 2 (two) times daily as needed for moderate pain.     levothyroxine 125 MCG tablet  Commonly known as:  SYNTHROID, LEVOTHROID  Take 1 tablet (125 mcg total) by mouth daily before breakfast.     losartan 50 MG tablet  Commonly known as:  COZAAR  Take 50 mg by mouth daily.     magnesium oxide 400 (241.3 Mg) MG tablet  Commonly known as:  MAG-OX  Take 1 tablet (400 mg total) by  mouth 2 (two) times daily.     metoprolol 50 MG tablet  Commonly known as:  LOPRESSOR  Take 1 tablet (50 mg total) by mouth 2 (two) times daily.     omeprazole 20 MG capsule  Commonly known as:  PRILOSEC  Take 20 mg by mouth daily.        Discharged Condition: skilled nursing home    Consults: cardiology  Significant Diagnostic Studies: Dg Chest 2 View  12/30/2015  CLINICAL DATA:  Weakness, leg swelling EXAM: CHEST  2 VIEW COMPARISON:  CTA chest dated 07/13/2015 FINDINGS: Cardiomegaly with mild interstitial/perihilar edema. Suspected small right pleural effusion. No pneumothorax. Mild degenerative changes of the visualized thoracolumbar spine. IMPRESSION: Cardiomegaly with mild interstitial edema and suspected small right pleural effusion. Electronically Signed   By: Charline Bills M.D.   On: 12/30/2015 19:23   US Abdomen Complete  01/04/2016  CLINICAL DATA:  Patient with abnormal LFTs.  Prior hysterectomy. EXAM: ABDOMEN ULTRASOUND COMPLETE COMPARISON:  Chest CT 07/13/2015 FINDINGS: Gallbladder: The gallbladder is mildly contracted. Gallbladder wall thickening upper limits of normal. No pericholecystic fluid. Negative sonographic Murphy's sign. No definite  gallstones identified. Common bile duct: Diameter: 2 mm Liver: No focal lesion identified. Within normal limits in parenchymal echogenicity. IVC: No abnormality visualized. Pancreas: Visualized portion unremarkable. Spleen: Size and appearance within normal limits. Right Kidney: Length: 11.0 cm. There is a 1.7 cm cyst within the superior pole. Normal renal cortical thickness echogenicity. No hydronephrosis. Left Kidney: Length: 10.3 cm. Echogenicity within normal limits. No mass or hydronephrosis visualized. Abdominal aorta: No aneurysm visualized. Other findings: None. IMPRESSION: No acute process within the abdomen. No cholelithiasis or sonographic evidence for acute cholecystitis. No biliary ductal dilatation. Electronically Signed    By: Annia Beltrew  Davis M.D.   On: 01/04/2016 08:48   Dg Chest Port 1 View  01/01/2016  CLINICAL DATA:  82 year old female with weakness and shortness of breath. EXAM: PORTABLE CHEST 1 VIEW COMPARISON:  12/30/2015 and prior exams FINDINGS: This is a mildly low volume film. Cardiomegaly and pulmonary vascular congestion again noted. Elevation of the right hemidiaphragm is unchanged. There is no evidence of focal airspace disease, pulmonary edema, suspicious pulmonary nodule/mass, pleural effusion, or pneumothorax. No acute bony abnormalities are identified. IMPRESSION: Low volume film with cardiomegaly and pulmonary vascular congestion. Electronically Signed   By: Harmon PierJeffrey  Hu M.D.   On: 01/01/2016 11:11   Dg Hip Unilat With Pelvis 2-3 Views Right  12/28/2015  CLINICAL DATA:  Pain for 2 weeks.  No history of recent trauma EXAM: DG HIP (WITH OR WITHOUT PELVIS) 2-3V RIGHT COMPARISON:  None. FINDINGS: Frontal pelvis as well as frontal and lateral right hip images were obtained. There is no demonstrable fracture or dislocation. There is mild symmetric narrowing of both hip joints. No erosive change. There is degenerative change in each sacroiliac joint and pubic symphysis regions as well. IMPRESSION: Degenerative type/osteoarthritic change in the visualized joint spaces. No fracture or dislocation. Electronically Signed   By: Bretta BangWilliam  Woodruff III M.D.   On: 12/28/2015 13:43    Lab Results: Basic Metabolic Panel:  Recent Labs  82/95/6202/10/12 0426  NA 131*  K 4.0  CL 101  CO2 22  GLUCOSE 90  BUN 39*  CREATININE 2.36*  CALCIUM 8.4*   Liver Function Tests:  Recent Labs  01/06/16 0426 01/07/16 1342  AST 135* 91*  ALT 154* 135*  ALKPHOS 158* 165*  BILITOT 1.6* 2.0*  PROT 6.0* 6.4*  ALBUMIN 3.1* 3.3*     CBC:  Recent Labs  01/06/16 0426 01/08/16 0108  WBC 6.6 6.0  HGB 12.6 12.5  HCT 39.6 38.7  MCV 93.6 94.6  PLT 170 169    Recent Results (from the past 240 hour(s))  MRSA PCR Screening      Status: None   Collection Time: 12/30/15 10:38 PM  Result Value Ref Range Status   MRSA by PCR NEGATIVE NEGATIVE Final    Comment:        The GeneXpert MRSA Assay (FDA approved for NASAL specimens only), is one component of a comprehensive MRSA colonization surveillance program. It is not intended to diagnose MRSA infection nor to guide or monitor treatment for MRSA infections.   Urine culture     Status: None   Collection Time: 12/31/15  8:55 AM  Result Value Ref Range Status   Specimen Description URINE, CLEAN CATCH  Final   Special Requests NONE  Final   Culture   Final    NO GROWTH 1 DAY Performed at [facility] [facility]    Report Status 01/01/2016 FINAL  Final     Hospital Course:   This  is a 82 years old female with history of multiple medical illnesses was admitted due to atrial fibrillation with RVR,. Patient also had acute kidney injury. Her Echo also showed an EF of 45%. Patient was started on Cardizem drip but her B/P was dropping. She was tried on amiodarone and later changed to lopressor. Her her heart is controlled on lopressor 50 mg po BID. Patient is started on low dose of lasix and losartan. Her BMP needs to be closely monitored in the nursing. She is not a candidate for anticoagulation due to risk of fall.  Discharge Exam: Blood pressure 97/70, pulse 104, temperature 97.8 F (36.6 C), temperature source Oral, resp. rate 18, height  (1.727 m), weight 122.18 kg (269 lb 5.7 oz), SpO2 92 %.   Disposition:  Stable       Signed: Tyashia Morrisette   01/08/2016, 8:38 AM

## 2016-01-09 ENCOUNTER — Encounter (HOSPITAL_COMMUNITY)
Admission: RE | Admit: 2016-01-09 | Discharge: 2016-01-09 | Disposition: A | Discharge status: Home / Self Care | Payer: Medicare Other | Source: Skilled Nursing Facility | Attending: Internal Medicine | Admitting: Internal Medicine

## 2016-01-09 DIAGNOSIS — F322 Major depressive disorder, single episode, severe without psychotic features: Secondary | ICD-10-CM | POA: Insufficient documentation

## 2016-01-09 DIAGNOSIS — M6281 Muscle weakness (generalized): Secondary | ICD-10-CM | POA: Insufficient documentation

## 2016-01-09 DIAGNOSIS — I5023 Acute on chronic systolic (congestive) heart failure: Secondary | ICD-10-CM | POA: Insufficient documentation

## 2016-01-09 DIAGNOSIS — E785 Hyperlipidemia, unspecified: Secondary | ICD-10-CM | POA: Insufficient documentation

## 2016-01-09 DIAGNOSIS — I4891 Unspecified atrial fibrillation: Secondary | ICD-10-CM | POA: Insufficient documentation

## 2016-01-09 DIAGNOSIS — I10 Essential (primary) hypertension: Secondary | ICD-10-CM | POA: Insufficient documentation

## 2016-01-09 DIAGNOSIS — F411 Generalized anxiety disorder: Secondary | ICD-10-CM | POA: Insufficient documentation

## 2016-01-09 LAB — COMPREHENSIVE METABOLIC PANEL
ALK PHOS: 144 U/L — AB (ref 38–126)
ALT: 87 U/L — ABNORMAL HIGH (ref 14–54)
ANION GAP: 10 (ref 5–15)
AST: 42 U/L — ABNORMAL HIGH (ref 15–41)
Albumin: 3.1 g/dL — ABNORMAL LOW (ref 3.5–5.0)
BILIRUBIN TOTAL: 1.9 mg/dL — AB (ref 0.3–1.2)
BUN: 34 mg/dL — ABNORMAL HIGH (ref 6–20)
CALCIUM: 8.8 mg/dL — AB (ref 8.9–10.3)
CO2: 23 mmol/L (ref 22–32)
Chloride: 102 mmol/L (ref 101–111)
Creatinine, Ser: 1.96 mg/dL — ABNORMAL HIGH (ref 0.44–1.00)
GFR, EST AFRICAN AMERICAN: 24 mL/min — AB (ref >60)
GFR, EST NON AFRICAN AMERICAN: 21 mL/min — AB (ref >60)
Glucose, Bld: 82 mg/dL (ref 65–99)
Potassium: 4 mmol/L (ref 3.5–5.1)
Sodium: 135 mmol/L (ref 135–145)
TOTAL PROTEIN: 6.1 g/dL — AB (ref 6.5–8.1)

## 2016-01-09 LAB — CBC
HEMATOCRIT: 41.4 % (ref 36.0–46.0)
HEMOGLOBIN: 13.3 g/dL (ref 12.0–15.0)
MCH: 30.7 pg (ref 26.0–34.0)
MCHC: 32.1 g/dL (ref 30.0–36.0)
MCV: 95.6 fL (ref 78.0–100.0)
Platelets: 164 10*3/uL (ref 150–400)
RBC: 4.33 MIL/uL (ref 3.87–5.11)
RDW: 17.2 % — ABNORMAL HIGH (ref 11.5–15.5)
WBC: 4.9 10*3/uL (ref 4.0–10.5)

## 2016-01-12 ENCOUNTER — Non-Acute Institutional Stay (SKILLED_NURSING_FACILITY): Payer: Medicare Other | Admitting: Internal Medicine

## 2016-01-12 DIAGNOSIS — I5041 Acute combined systolic (congestive) and diastolic (congestive) heart failure: Secondary | ICD-10-CM

## 2016-01-12 DIAGNOSIS — R7989 Other specified abnormal findings of blood chemistry: Secondary | ICD-10-CM

## 2016-01-12 DIAGNOSIS — I890 Lymphedema, not elsewhere classified: Secondary | ICD-10-CM

## 2016-01-12 DIAGNOSIS — N184 Chronic kidney disease, stage 4 (severe): Secondary | ICD-10-CM

## 2016-01-12 DIAGNOSIS — I4819 Other persistent atrial fibrillation: Secondary | ICD-10-CM

## 2016-01-12 DIAGNOSIS — I481 Persistent atrial fibrillation: Secondary | ICD-10-CM | POA: Diagnosis not present

## 2016-01-12 DIAGNOSIS — R945 Abnormal results of liver function studies: Secondary | ICD-10-CM

## 2016-01-12 NOTE — Progress Notes (Signed)
Patient ID: Michaela Tyler, female   DOB: 18-Sep-1921, 82 y.o.   MRN: [id number]    Chief complaint; admission to [facility] post stay at acute care Facility; [facility] History; this is a patient who was admitted to hospital with refractory atrial fibrillation with rapid ventricular response. She did not tolerated cardiazem drip due to hypotension, was trialed on amiodarone and ultimately discharged on Lopressor. She was not felt to be in heart failure and was given IV fluids with seems to help. Her kidney function. The patient states that she had rapid increase in edema in her legs over the last 2-3 weeks of increasing difficulty walking. She also states she had right hip pain. Prior to that she claims to a been functional in her own home although I'm not certain culture that was.  CMP Latest Ref Rng 01/09/2016 01/07/2016 01/06/2016  Glucose 65 - 99 mg/dL 82 - 90  BUN 6 - 20 mg/dL 40(J) - 81(X)  Creatinine 0.44 - 1.00 mg/dL 9.14(N) - 8.29(F)  Sodium 135 - 145 mmol/L 135 - 131(L)  Potassium 3.5 - 5.1 mmol/L 4.0 - 4.0  Chloride 101 - 111 mmol/L 102 - 101  CO2 22 - 32 mmol/L 23 - 22  Calcium 8.9 - 10.3 mg/dL 6.2(Z) - 8.4(L)  Total Protein 6.5 - 8.1 g/dL 6.1(L) 6.4(L) 6.0(L)  Total Bilirubin 0.3 - 1.2 mg/dL 3.0(Q) 2.0(H) 1.6(H)  Alkaline Phos 38 - 126 U/L 144(H) 165(H) 158(H)  AST 15 - 41 U/L 42(H) 91(H) 135(H)  ALT 14 - 54 U/L 87(H) 135(H) 154(H)   CBC Latest Ref Rng 01/09/2016 01/08/2016 01/06/2016  WBC 4.0 - 10.5 K/uL 4.9 6.0 6.6  Hemoglobin 12.0 - 15.0 g/dL 65.7 84.6 96.2  Hematocrit 36.0 - 46.0 % 41.4 38.7 39.6  Platelets 150 - 400 K/uL 164 169 170   Past Medical History  Diagnosis Date  . Hypertension   . High cholesterol   . GERD (gastroesophageal reflux disease)   . Arthritis   . Anxiety   . Depression   . Cataract   . Hearing reduced   . PONV (postoperative nausea and vomiting)   . H/O left breast biopsy     NO CANCER  . Low back pain   . Chronic right hip pain     Past  Surgical History  Procedure Laterality Date  . Total knee arthroplasty  2008    left, [facility]  . Elbow surgery      left  . Abdominal hysterectomy    . Knee arthroscopy      right  . Breast lumpectomy  1998    left  . Cataract extraction w/phaco  03/23/2012    Procedure: CATARACT EXTRACTION PHACO AND INTRAOCULAR LENS PLACEMENT (IOC);  Surgeon: Loraine Leriche T. Nile Riggs, MD;  Location: [facility];  Service: Ophthalmology;  Laterality: Right;  CDE:12.73  . Cataract extraction w/phaco  04/20/2012    Procedure: CATARACT EXTRACTION PHACO AND INTRAOCULAR LENS PLACEMENT (IOC);  Surgeon: Loraine Leriche T. Nile Riggs, MD;  Location: [facility];  Service: Ophthalmology;  Laterality: Left;  CDE:7.70  . Yag laser application Left 11/07/2014    Procedure: YAG LASER APPLICATION;  Surgeon: Loraine Leriche T. Nile Riggs, MD;  Location: [facility];  Service: Ophthalmology;  Laterality: Left;  left  . Yag laser application Right 11/21/2014    Procedure: YAG LASER APPLICATION;  Surgeon: Loraine Leriche T. Nile Riggs, MD;  Location: [facility];  Service: Ophthalmology;  Laterality: Right;  right    Current Outpatient Prescriptions on File Prior to Visit  Medication Sig Dispense Refill  . ALPRAZolam (XANAX) 0.5 MG tablet Take 1 tablet (0.5 mg total) by mouth 2 (two) times daily. For anxiety 60 tablet 0  . aspirin EC 81 MG tablet Take 81 mg by mouth daily.    Marland Kitchen atorvastatin (LIPITOR) 40 MG tablet Take 40 mg by mouth daily.    . cetirizine (ZYRTEC) 10 MG tablet Take 10 mg by mouth daily.    . citalopram (CELEXA) 10 MG tablet Take 10 mg by mouth at bedtime.   3  . ferrous sulfate 325 (65 FE) MG tablet Take 325 mg by mouth daily with breakfast.    . furosemide (LASIX) 20 MG tablet Take 20 mg by mouth daily.    Marland Kitchen HYDROcodone-acetaminophen (NORCO/VICODIN) 5-325 MG tablet Take 1 tablet by mouth 2 (two) times daily as needed for moderate pain. 30 tablet 0  . levothyroxine (SYNTHROID, LEVOTHROID) 125 MCG tablet Take 1 tablet (125 mcg total) by mouth daily before breakfast. 30 tablet 3    . losartan (COZAAR) 50 MG tablet Take 50 mg by mouth daily.    . magnesium oxide (MAG-OX) 400 (241.3 Mg) MG tablet Take 1 tablet (400 mg total) by mouth 2 (two) times daily. 60 tablet 3  . metoprolol (LOPRESSOR) 50 MG tablet Take 1 tablet (50 mg total) by mouth 2 (two) times daily. 60 tablet 3  . omeprazole (PRILOSEC) 20 MG capsule Take 20 mg by mouth daily.  3    Social; very little information at this point. Patient states that before 3 weeks ago she was ambulatory still driving and independent. I'm not certain how true this is. She claims she lives with her husband somewhere between [place] and [place] she was not using a walker  Family History  Problem Relation Age of Onset  . Colon cancer Neg Hx   . Liver disease Neg Hx     Rewiew of systems  General; patient is not in any distress Respiratory; no cough no sputum Cardiac no chest pain or palpitations Abdomen no liver spleen or tenderness GU no suprapubic or costovertebral angle tenderness. Extremities; patient states the edema in her legs only came up in the last 2-3 weeks. She is not complaining of pain Musculoskeletal; says she has a history of rheumatoid arthritis Neurologic she is not complaining of weakness and numbness Endocrine states she is not a diabetic. Psychiatric no complaints of depression  Physical examination Gen. patient is not in any distress Vitals; blood pressure 99/68 respirations 20 pulse 121 temperature 98.2. Weight from 01/08/16 was 254.8. On 01/10/16 at 253 HEENT she is a.Jamesetta So but has dentures no oral lesions mucous membranes are moist Respiratory; clear entry bilaterally wheezing Cardiac; heart sounds are still tachycardia with a pulse around 100 but fairly regular. Abdomen; obese no liver no spleen no masses GU bladder is not felt to be enlarged. Vascular; I cannot feel pulses in her feet every 2 edema. Even her upper extremities her pulses are thready and difficult to  feel. Musculoskeletal; she has no active joints. Looks as though she's had a previous left total knee replacement Neurologic; she has no pronator drift. Does have antigravity strength in her legs Extremities; legs are very enlarged with pitting edema in her lower legs. Very enlarged thighs likely lymphedema    EXAM: PORTABLE CHEST 1 VIEW   COMPARISON:  12/30/2015 and prior exams   FINDINGS: This is a mildly low volume film.   Cardiomegaly and pulmonary vascular congestion again noted.   Elevation  of the right hemidiaphragm is unchanged.   There is no evidence of focal airspace disease, pulmonary edema, suspicious pulmonary nodule/mass, pleural effusion, or pneumothorax.   No acute bony abnormalities are identified.   IMPRESSION: Low volume film with cardiomegaly and pulmonary vascular congestion.     Electronically Signed   By: Harmon Pier M.D.   On: 01/01/2016 11:11                          *[facility]*                         [address]                        [place], [place] [address]                            [phone]   ------------------------------------------------------------------- Transthoracic Echocardiography   Patient:    Easton, Sivertson MR #:       [id number] Study Date: 01/01/2016 Gender:     F Age:        89 Height:     172.7 cm Weight:     114.2 kg BSA:        2.39 m^2 Pt. Status: Room:    [place]  ATTENDING    Samuel Jester [id number]  Ardeen Fillers  SONOGRAPHER  Jeryl Columbia  PERFORMING   [facility], [facility] Hawking   cc:   ------------------------------------------------------------------- LV EF: 45% -   50%   ------------------------------------------------------------------- History:   PMH:  LBBB, Acute Kidney Injury. Limited to evaluate LV function.  Atrial fibrillation.   ------------------------------------------------------------------- Study Conclusions   - Left  ventricle: The cavity size was normal. Wall thickness was   increased increased in a pattern of mild to moderate LVH.   Systolic function was mildly reduced. The estimated ejection   fraction was in the range of 45% to 50%. - Regional wall motion abnormality: Hypokinesis of the basal-mid   anteroseptal, basal inferoseptal, and apical septal myocardium;   probable hypokinesis of the mid inferoseptal myocardium. - Aortic valve: Moderately to severely calcified annulus.   Trileaflet; moderately thickened leaflets. Valve area (VTI): 1.54   cm^2. - Aortic root: The visualized portion of the proximal ascending   aorta is mildly dilated at 3.8 cm. - Mitral valve: Severely calcified annulus. Moderately thickened   leaflets . There was mild regurgitation. - Left atrium: The atrium was severely dilated. - Right ventricle: The cavity size was moderately dilated. Systolic   function was mildly to moderately reduced. - Right atrium: The atrium was severely dilated. The atrial septum   bows to the left suggesting elevated RA pressure. - Pulmonary arteries: Systolic pressure was moderately increased.   PA peak pressure: 47 mm Hg (S). - Inferior vena cava: The vessel was dilated. The respirophasic   diameter changes were blunted (< 50%), consistent with elevated   central venous pressure. - Technically adequate study.   Transthoracic echocardiography.  M-mode, complete 2D, spectral Doppler, and color Doppler.  Birthdate:  Patient birthdate: June 12, 1921.  Age:  Patient is 82 yr old.  Sex:  Gender: female. BMI: 38.3 kg/m^2.  Blood pressure:     99/82  Patient status: Inpatient.  Study date:  Study date: 01/01/2016. Study time:  11:24 AM.  Location:  Bedside.   -------------------------------------------------------------------   ------------------------------------------------------------------- Left ventricle:  The cavity size was normal. Wall thickness was increased increased in a pattern of mild  to moderate LVH. Systolic function was mildly reduced. The estimated ejection fraction was in the range of 45% to 50%.  Regional wall motion abnormalities: Hypokinesis of the basal-mid anteroseptal, basal inferoseptal, and apical septal myocardium; probable hypokinesis of the mid inferoseptal myocardium. The study was not technically sufficient to allow evaluation of LV diastolic dysfunction due to atrial fibrillation.   ------------------------------------------------------------------- Aortic valve:   Moderately to severely calcified annulus. Trileaflet; moderately thickened leaflets.  Doppler:   There was no stenosis.   There was no significant regurgitation.    VTI ratio of LVOT to aortic valve: 0.61. Valve area (VTI): 1.54 cm^2. Indexed valve area (VTI): 0.64 cm^2/m^2. Peak velocity ratio of LVOT to aortic valve: 0.57.    Mean gradient (S): 3 mm Hg. Peak gradient (S): 6 mm Hg.   ------------------------------------------------------------------- Aorta:  Aortic root: The aortic root was normal in size. The visualized portion of the proximal ascending aorta is mildly dilated at 3.8 cm.   ------------------------------------------------------------------- Mitral valve:   Severely calcified annulus. Moderately thickened leaflets .  Doppler:   There was no evidence for stenosis.   There was mild regurgitation.    Peak gradient (D): 3 mm Hg.   ------------------------------------------------------------------- Left atrium:  The atrium was severely dilated.   ------------------------------------------------------------------- Right ventricle:  The ventricular septum is flattened in systole and diastole suggesting RV pressure and volume overload . The cavity size was moderately dilated. The RV shares the apex with the LV. Systolic function was mildly to moderately reduced.   ------------------------------------------------------------------- Pulmonic valve:   Not well visualized.   Doppler:   There was no evidence for stenosis.   There was mild regurgitation.   ------------------------------------------------------------------- Tricuspid valve:   Normal thickness leaflets.  Doppler:   There was no evidence for stenosis.   There was no significant regurgitation.    ------------------------------------------------------------------- Pulmonary artery:   Systolic pressure was moderately increased.   ------------------------------------------------------------------- Right atrium:  The atrium was severely dilated. The atrial septum bows to the left suggesting elevated RA pressure.   ------------------------------------------------------------------- Pericardium:  Trivial circumferential pericardial effusion.   ------------------------------------------------------------------- Systemic veins: Inferior vena cava: The vessel was dilated. The respirophasic diameter changes were blunted (< 50%), consistent with elevated central venous pressure.   -------------------------------------------------------------------   Impression/plan #1 refractory atrial fibrillation; heart rate is still about 100. Although most of the pulses that I'm seeing are less than this. She was not felt to be an anticoagulation candidate secondary to fall risk. #2 history of a presumably ischemic cardiomyopathy given the tone of her echocardiogram. She is not currently describing chest pain and she is not currently in heart failure. #3 stage IV chronic renal failure again the exact nature of this is not completely clear likely nephrosclerosis. #4 osteoarthritis. Patient tells me she has a history of rheumatoid arthritis. She did have x-ray of her hip and pelvis that showed degenerative osteoarthritic changes. #5 abnormal liver function tests. She did have an ultrasound of the abdomen that showed an no acute process with no cholelithiasis or acute cholecystitis. Her liver and pancreas were normal. She  is on Lipitor but there is some elevation of her alkaline phosphatase well. We'll follow this clinically. #6 patient states he was on Synthroid at some point in time. Her TSH was over 6 in the hospital I would simply follow  this in another 6 months at this point. #7 marked lower extremity swelling which I think is probably some combination of venous insufficiency and lymphedema I don't think she needs further diuretic. She might do with compression stockings of some form. #8 I did not attempt to ambulate this woman but it would surprise me if there is going to be problems here.

## 2016-01-17 ENCOUNTER — Non-Acute Institutional Stay (SKILLED_NURSING_FACILITY): Payer: Medicare Other | Admitting: Internal Medicine

## 2016-01-17 ENCOUNTER — Other Ambulatory Visit: Payer: Self-pay

## 2016-01-17 DIAGNOSIS — M2550 Pain in unspecified joint: Secondary | ICD-10-CM

## 2016-01-17 DIAGNOSIS — E039 Hypothyroidism, unspecified: Secondary | ICD-10-CM | POA: Insufficient documentation

## 2016-01-17 DIAGNOSIS — R945 Abnormal results of liver function studies: Secondary | ICD-10-CM

## 2016-01-17 DIAGNOSIS — Z9181 History of falling: Secondary | ICD-10-CM

## 2016-01-17 DIAGNOSIS — I4891 Unspecified atrial fibrillation: Secondary | ICD-10-CM

## 2016-01-17 DIAGNOSIS — R296 Repeated falls: Secondary | ICD-10-CM

## 2016-01-17 DIAGNOSIS — I482 Chronic atrial fibrillation, unspecified: Secondary | ICD-10-CM

## 2016-01-17 DIAGNOSIS — R7989 Other specified abnormal findings of blood chemistry: Secondary | ICD-10-CM

## 2016-01-17 DIAGNOSIS — N179 Acute kidney failure, unspecified: Secondary | ICD-10-CM | POA: Diagnosis not present

## 2016-01-17 MED ORDER — TRAMADOL HCL 50 MG PO TABS: 50.0000 mg | ORAL_TABLET | Freq: Two times a day (BID) | ORAL | Status: DC | PRN

## 2016-01-17 MED ORDER — ALPRAZOLAM 0.25 MG PO TABS: 0.2500 mg | ORAL_TABLET | Freq: Two times a day (BID) | ORAL | Status: DC | PRN

## 2016-01-17 NOTE — Assessment & Plan Note (Signed)
Focus will be on rate control

## 2016-01-17 NOTE — Patient Instructions (Signed)
See summary under each active problem in the Problem List with associated updated therapeutic plan. Com[facility] 

## 2016-01-17 NOTE — Assessment & Plan Note (Signed)
Discontinue atorvastatin

## 2016-01-17 NOTE — Assessment & Plan Note (Signed)
Decrease thyroxine back to 88 g as the risk of high dose thyroid replacement outweighs the mild hypothyroidism with her atrial fibrillation

## 2016-01-17 NOTE — Telephone Encounter (Signed)
Rx faxed to [facility] at [phone].   Phone #: [phone]  

## 2016-01-17 NOTE — Progress Notes (Deleted)
Patient ID: Arlyn LeakGarnett M Mozer, female   DOB: 06-Apr-1921, 82 y.o.   MRN: [id number]     Chief Complaint  Patient presents with  . Acute Visit    Decline in status- family requested visit      This is a nursing facility follow up for acute issue with update of medical problem list and diagnoses with formulation of appropriate care plan as noted. Interim medical record and care plan; diagnostic studies; and change in clinical status since last visit are documented as follows. Note: only family and social history pertinent to this assessment are included. HPI: Review of systems: Diagnoses: Plan:

## 2016-01-17 NOTE — Progress Notes (Signed)
   Subjective:    Patient ID: Michaela Tyler, female    DOB: Feb 23, 1921, 82 y.o.   MRN: [id number]  HPI This is a nursing facility follow up for acute issue described as altered mental status and decline in overall condition as per her family, her husband & daughter Mrs. [name], power of attorney. Interim medical record and care plan; diagnostic studies; and change in clinical status since last visit are documented in the problem list with a therapeutic plan.   HPI: She was hospitalized 3/5-3/14/17 with atrial fibrillation with rapid ventricular response. She was felt to have acute on chronic systolic congestive heart failure with associated elevation of liver function tests. ALT was 154 and AST 135. She also was found to have renal insufficiency with a creatinine of 3.36 and BUN 39   Review of Systems the patient is very lethargic and nonverbal at this time. According to the family have had concerns about polypharmacy and possible adverse effects of treatment.     Objective:   Physical Exam Pertinent or positive findings include: As noted she is very lethargic; bilateral ptosis is present. Completely edentulous. Heart rhythm and rate are irregular/irregular. She has 1+ pitting edema of the lower extremities. Pedal pulses are decreased especially the dorsalis pedis pulses. General appearance :adequately nourished; in no distress.  Eyes: No conjunctival inflammation or scleral icterus is present.  Oral exam:  Lips and gums are healthy appearing.There is no oropharyngeal erythema or exudate noted.   Heart:  without gallop, murmur, click, rub or other extra sounds    Lungs:Chest clear to auscultation; no wheezes, rhonchi,rales ,or rubs present.No increased work of breathing.   Abdomen: bowel sounds normal, soft and non-tender without masses, organomegaly or hernias noted.  No guarding or rebound.   Vascular : all pulses equal ; no bruits present.  Skin:Warm & dry.  Intact without  suspicious lesions or rashes ; no tenting   Lymphatic: No lymphadenopathy is noted about the head, neck, axilla  Neuro: Strength, tone generally decreased      Assessment & Plan:  See summary under each active problem in the Problem List with associated updated therapeutic plan

## 2016-01-17 NOTE — Assessment & Plan Note (Signed)
Risk of present medical regimen discussed with her husband and her daughter. There are multiple agents on the list on the [facility]' list. See the order changes in reference to alprazolam, hydrocodone, and Zyrtec.

## 2016-01-21 ENCOUNTER — Encounter (HOSPITAL_COMMUNITY)
Admission: RE | Admit: 2016-01-21 | Discharge: 2016-01-21 | Disposition: A | Discharge status: Home / Self Care | Payer: Medicare Other | Source: Skilled Nursing Facility | Attending: Internal Medicine | Admitting: Internal Medicine

## 2016-01-21 LAB — COMPREHENSIVE METABOLIC PANEL
ALT: 31 U/L (ref 14–54)
ANION GAP: 12 (ref 5–15)
AST: 28 U/L (ref 15–41)
Albumin: 2.7 g/dL — ABNORMAL LOW (ref 3.5–5.0)
Alkaline Phosphatase: 149 U/L — ABNORMAL HIGH (ref 38–126)
BUN: 97 mg/dL — ABNORMAL HIGH (ref 6–20)
CALCIUM: 8.5 mg/dL — AB (ref 8.9–10.3)
CHLORIDE: 102 mmol/L (ref 101–111)
CO2: 21 mmol/L — AB (ref 22–32)
Creatinine, Ser: 4.88 mg/dL — ABNORMAL HIGH (ref 0.44–1.00)
GFR calc non Af Amer: 7 mL/min — ABNORMAL LOW (ref >60)
GFR, EST AFRICAN AMERICAN: 8 mL/min — AB (ref >60)
Glucose, Bld: 96 mg/dL (ref 65–99)
Potassium: 5.3 mmol/L — ABNORMAL HIGH (ref 3.5–5.1)
SODIUM: 135 mmol/L (ref 135–145)
Total Bilirubin: 1.5 mg/dL — ABNORMAL HIGH (ref 0.3–1.2)
Total Protein: 5.5 g/dL — ABNORMAL LOW (ref 6.5–8.1)

## 2016-01-21 LAB — CK: Total CK: 36 U/L — ABNORMAL LOW (ref 38–234)

## 2016-01-22 ENCOUNTER — Encounter (HOSPITAL_COMMUNITY): Payer: Self-pay | Admitting: Cardiology

## 2016-01-22 ENCOUNTER — Inpatient Hospital Stay (HOSPITAL_COMMUNITY)
Admission: EM | Admit: 2016-01-22 | Discharge: 2016-01-29 | DRG: 683 | Disposition: A | Discharge status: Skilled Nursing Facility | Payer: Medicare Other | Attending: Internal Medicine | Admitting: Internal Medicine

## 2016-01-22 ENCOUNTER — Encounter: Payer: Self-pay | Admitting: Internal Medicine

## 2016-01-22 ENCOUNTER — Non-Acute Institutional Stay (SKILLED_NURSING_FACILITY): Payer: Medicare Other | Admitting: Internal Medicine

## 2016-01-22 DIAGNOSIS — E8889 Other specified metabolic disorders: Secondary | ICD-10-CM | POA: Diagnosis present

## 2016-01-22 DIAGNOSIS — R1312 Dysphagia, oropharyngeal phase: Secondary | ICD-10-CM | POA: Diagnosis not present

## 2016-01-22 DIAGNOSIS — K219 Gastro-esophageal reflux disease without esophagitis: Secondary | ICD-10-CM | POA: Diagnosis present

## 2016-01-22 DIAGNOSIS — E871 Hypo-osmolality and hyponatremia: Secondary | ICD-10-CM | POA: Diagnosis present

## 2016-01-22 DIAGNOSIS — I481 Persistent atrial fibrillation: Secondary | ICD-10-CM | POA: Diagnosis not present

## 2016-01-22 DIAGNOSIS — N183 Chronic kidney disease, stage 3 (moderate): Secondary | ICD-10-CM | POA: Diagnosis present

## 2016-01-22 DIAGNOSIS — E785 Hyperlipidemia, unspecified: Secondary | ICD-10-CM | POA: Diagnosis not present

## 2016-01-22 DIAGNOSIS — E7800 Pure hypercholesterolemia, unspecified: Secondary | ICD-10-CM | POA: Diagnosis present

## 2016-01-22 DIAGNOSIS — Z87440 Personal history of urinary (tract) infections: Secondary | ICD-10-CM | POA: Diagnosis not present

## 2016-01-22 DIAGNOSIS — Z8679 Personal history of other diseases of the circulatory system: Secondary | ICD-10-CM

## 2016-01-22 DIAGNOSIS — I272 Other secondary pulmonary hypertension: Secondary | ICD-10-CM | POA: Diagnosis present

## 2016-01-22 DIAGNOSIS — N179 Acute kidney failure, unspecified: Principal | ICD-10-CM | POA: Diagnosis present

## 2016-01-22 DIAGNOSIS — Z6835 Body mass index (BMI) 35.0-35.9, adult: Secondary | ICD-10-CM

## 2016-01-22 DIAGNOSIS — T502X5A Adverse effect of carbonic-anhydrase inhibitors, benzothiadiazides and other diuretics, initial encounter: Secondary | ICD-10-CM | POA: Diagnosis not present

## 2016-01-22 DIAGNOSIS — M2550 Pain in unspecified joint: Secondary | ICD-10-CM

## 2016-01-22 DIAGNOSIS — E875 Hyperkalemia: Secondary | ICD-10-CM | POA: Diagnosis present

## 2016-01-22 DIAGNOSIS — E039 Hypothyroidism, unspecified: Secondary | ICD-10-CM | POA: Diagnosis present

## 2016-01-22 DIAGNOSIS — N390 Urinary tract infection, site not specified: Secondary | ICD-10-CM | POA: Diagnosis present

## 2016-01-22 DIAGNOSIS — E669 Obesity, unspecified: Secondary | ICD-10-CM | POA: Diagnosis present

## 2016-01-22 DIAGNOSIS — D649 Anemia, unspecified: Secondary | ICD-10-CM | POA: Diagnosis not present

## 2016-01-22 DIAGNOSIS — Z9181 History of falling: Secondary | ICD-10-CM

## 2016-01-22 DIAGNOSIS — I10 Essential (primary) hypertension: Secondary | ICD-10-CM | POA: Diagnosis not present

## 2016-01-22 DIAGNOSIS — I130 Hypertensive heart and chronic kidney disease with heart failure and stage 1 through stage 4 chronic kidney disease, or unspecified chronic kidney disease: Secondary | ICD-10-CM | POA: Diagnosis present

## 2016-01-22 DIAGNOSIS — R488 Other symbolic dysfunctions: Secondary | ICD-10-CM | POA: Diagnosis not present

## 2016-01-22 DIAGNOSIS — M6281 Muscle weakness (generalized): Secondary | ICD-10-CM | POA: Diagnosis not present

## 2016-01-22 DIAGNOSIS — R000 Tachycardia, unspecified: Secondary | ICD-10-CM | POA: Diagnosis not present

## 2016-01-22 DIAGNOSIS — I509 Heart failure, unspecified: Secondary | ICD-10-CM

## 2016-01-22 DIAGNOSIS — M1990 Unspecified osteoarthritis, unspecified site: Secondary | ICD-10-CM | POA: Diagnosis not present

## 2016-01-22 DIAGNOSIS — I5042 Chronic combined systolic (congestive) and diastolic (congestive) heart failure: Secondary | ICD-10-CM | POA: Diagnosis present

## 2016-01-22 DIAGNOSIS — I4891 Unspecified atrial fibrillation: Secondary | ICD-10-CM | POA: Diagnosis not present

## 2016-01-22 DIAGNOSIS — Z7982 Long term (current) use of aspirin: Secondary | ICD-10-CM | POA: Diagnosis not present

## 2016-01-22 DIAGNOSIS — N178 Other acute kidney failure: Secondary | ICD-10-CM | POA: Diagnosis not present

## 2016-01-22 DIAGNOSIS — E559 Vitamin D deficiency, unspecified: Secondary | ICD-10-CM | POA: Diagnosis present

## 2016-01-22 DIAGNOSIS — Z96652 Presence of left artificial knee joint: Secondary | ICD-10-CM | POA: Diagnosis present

## 2016-01-22 DIAGNOSIS — R278 Other lack of coordination: Secondary | ICD-10-CM | POA: Diagnosis not present

## 2016-01-22 DIAGNOSIS — I5023 Acute on chronic systolic (congestive) heart failure: Secondary | ICD-10-CM | POA: Diagnosis not present

## 2016-01-22 LAB — URINALYSIS, ROUTINE W REFLEX MICROSCOPIC
BILIRUBIN URINE: NEGATIVE
Glucose, UA: NEGATIVE mg/dL
KETONES UR: NEGATIVE mg/dL
Nitrite: POSITIVE — AB
PROTEIN: 30 mg/dL — AB
Specific Gravity, Urine: 1.025 (ref 1.005–1.030)
pH: 6 (ref 5.0–8.0)

## 2016-01-22 LAB — CBC WITH DIFFERENTIAL/PLATELET
Basophils Absolute: 0 10*3/uL (ref 0.0–0.1)
Basophils Relative: 0 %
EOS PCT: 2 %
Eosinophils Absolute: 0.2 10*3/uL (ref 0.0–0.7)
HCT: 39.7 % (ref 36.0–46.0)
Hemoglobin: 12.9 g/dL (ref 12.0–15.0)
LYMPHS ABS: 1.2 10*3/uL (ref 0.7–4.0)
Lymphocytes Relative: 18 %
MCH: 30.8 pg (ref 26.0–34.0)
MCHC: 32.5 g/dL (ref 30.0–36.0)
MCV: 94.7 fL (ref 78.0–100.0)
MONO ABS: 0.9 10*3/uL (ref 0.1–1.0)
Monocytes Relative: 14 %
NEUTROS PCT: 66 %
Neutro Abs: 4.1 10*3/uL (ref 1.7–7.7)
PLATELETS: 161 10*3/uL (ref 150–400)
RBC: 4.19 MIL/uL (ref 3.87–5.11)
RDW: 18.3 % — AB (ref 11.5–15.5)
WBC: 6.4 10*3/uL (ref 4.0–10.5)

## 2016-01-22 LAB — COMPREHENSIVE METABOLIC PANEL
ALT: 33 U/L (ref 14–54)
AST: 34 U/L (ref 15–41)
Albumin: 3 g/dL — ABNORMAL LOW (ref 3.5–5.0)
Alkaline Phosphatase: 154 U/L — ABNORMAL HIGH (ref 38–126)
Anion gap: 14 (ref 5–15)
BUN: >100 mg/dL — ABNORMAL HIGH (ref 6–20)
CHLORIDE: 101 mmol/L (ref 101–111)
CO2: 18 mmol/L — ABNORMAL LOW (ref 22–32)
CREATININE: 5.32 mg/dL — AB (ref 0.44–1.00)
Calcium: 8.5 mg/dL — ABNORMAL LOW (ref 8.9–10.3)
GFR, EST AFRICAN AMERICAN: 7 mL/min — AB (ref >60)
GFR, EST NON AFRICAN AMERICAN: 6 mL/min — AB (ref >60)
Glucose, Bld: 124 mg/dL — ABNORMAL HIGH (ref 65–99)
Potassium: 5.7 mmol/L — ABNORMAL HIGH (ref 3.5–5.1)
Sodium: 133 mmol/L — ABNORMAL LOW (ref 135–145)
TOTAL PROTEIN: 6 g/dL — AB (ref 6.5–8.1)
Total Bilirubin: 1.6 mg/dL — ABNORMAL HIGH (ref 0.3–1.2)

## 2016-01-22 LAB — URINE MICROSCOPIC-ADD ON

## 2016-01-22 LAB — LACTIC ACID, PLASMA: LACTIC ACID, VENOUS: 2.9 mmol/L — AB (ref 0.5–2.0)

## 2016-01-22 MED ORDER — LEVOTHYROXINE SODIUM 88 MCG PO TABS
88.0000 ug | ORAL_TABLET | Freq: Every day | ORAL | Status: DC
  Administered 2016-01-23 – 2016-01-29 (×7): 88 ug via ORAL
  Filled 2016-01-22 (×7): qty 1

## 2016-01-22 MED ORDER — ACETAMINOPHEN 650 MG RE SUPP: 650.0000 mg | Freq: Four times a day (QID) | RECTAL | Status: DC | PRN

## 2016-01-22 MED ORDER — INSULIN ASPART 100 UNIT/ML IV SOLN
10.0000 [IU] | Freq: Once | INTRAVENOUS | Status: AC
  Administered 2016-01-22: 10 [IU] via INTRAVENOUS

## 2016-01-22 MED ORDER — SODIUM CHLORIDE 0.9 % IV SOLN
INTRAVENOUS | Status: AC
  Administered 2016-01-22 – 2016-01-23 (×2): via INTRAVENOUS

## 2016-01-22 MED ORDER — ONDANSETRON HCL 4 MG PO TABS: 4.0000 mg | ORAL_TABLET | Freq: Four times a day (QID) | ORAL | Status: DC | PRN

## 2016-01-22 MED ORDER — CEFTRIAXONE SODIUM 1 G IJ SOLR
1.0000 g | INTRAMUSCULAR | Status: DC
Start: 2016-01-22 — End: 2016-01-22
  Filled 2016-01-22: qty 10

## 2016-01-22 MED ORDER — DEXTROSE 5 % IV SOLN
1.0000 g | INTRAVENOUS | Status: DC
  Administered 2016-01-24 – 2016-01-28 (×5): 1 g via INTRAVENOUS
  Filled 2016-01-22 (×7): qty 10

## 2016-01-22 MED ORDER — PANTOPRAZOLE SODIUM 40 MG PO TBEC
40.0000 mg | DELAYED_RELEASE_TABLET | Freq: Every day | ORAL | Status: DC
  Administered 2016-01-22 – 2016-01-29 (×8): 40 mg via ORAL
  Filled 2016-01-22 (×8): qty 1

## 2016-01-22 MED ORDER — SENNOSIDES-DOCUSATE SODIUM 8.6-50 MG PO TABS: 1.0000 | ORAL_TABLET | Freq: Every evening | ORAL | Status: DC | PRN

## 2016-01-22 MED ORDER — ASPIRIN EC 81 MG PO TBEC
81.0000 mg | DELAYED_RELEASE_TABLET | Freq: Every day | ORAL | Status: DC
  Administered 2016-01-22 – 2016-01-29 (×8): 81 mg via ORAL
  Filled 2016-01-22 (×8): qty 1

## 2016-01-22 MED ORDER — SODIUM CHLORIDE 0.9 % IV BOLUS (SEPSIS)
500.0000 mL | Freq: Once | INTRAVENOUS | Status: AC
  Administered 2016-01-22: 500 mL via INTRAVENOUS

## 2016-01-22 MED ORDER — ONDANSETRON HCL 4 MG/2ML IJ SOLN: 4.0000 mg | Freq: Four times a day (QID) | INTRAMUSCULAR | Status: DC | PRN

## 2016-01-22 MED ORDER — HEPARIN SODIUM (PORCINE) 5000 UNIT/ML IJ SOLN
5000.0000 [IU] | Freq: Three times a day (TID) | INTRAMUSCULAR | Status: DC
  Administered 2016-01-22 – 2016-01-29 (×20): 5000 [IU] via SUBCUTANEOUS
  Filled 2016-01-22 (×19): qty 1

## 2016-01-22 MED ORDER — DEXTROSE 50 % IV SOLN
1.0000 | Freq: Once | INTRAVENOUS | Status: AC
  Administered 2016-01-22: 50 mL via INTRAVENOUS

## 2016-01-22 MED ORDER — DEXTROSE 50 % IV SOLN
INTRAVENOUS | Status: AC
  Filled 2016-01-22: qty 50

## 2016-01-22 MED ORDER — ENOXAPARIN SODIUM 40 MG/0.4ML ~~LOC~~ SOLN: 40.0000 mg | SUBCUTANEOUS | Status: DC

## 2016-01-22 MED ORDER — ACETAMINOPHEN 325 MG PO TABS: 650.0000 mg | ORAL_TABLET | Freq: Four times a day (QID) | ORAL | Status: DC | PRN

## 2016-01-22 MED ORDER — DEXTROSE 5 % IV SOLN
1.0000 g | Freq: Once | INTRAVENOUS | Status: AC
  Administered 2016-01-22: 1 g via INTRAVENOUS
  Filled 2016-01-22: qty 10

## 2016-01-22 MED ORDER — SODIUM CHLORIDE 0.9% FLUSH
3.0000 mL | Freq: Two times a day (BID) | INTRAVENOUS | Status: DC
  Administered 2016-01-22 – 2016-01-28 (×12): 3 mL via INTRAVENOUS

## 2016-01-22 MED ORDER — CITALOPRAM HYDROBROMIDE 20 MG PO TABS
10.0000 mg | ORAL_TABLET | Freq: Every day | ORAL | Status: DC
  Administered 2016-01-22 – 2016-01-28 (×7): 10 mg via ORAL
  Filled 2016-01-22 (×7): qty 1

## 2016-01-22 MED ORDER — INSULIN ASPART 100 UNIT/ML ~~LOC~~ SOLN
SUBCUTANEOUS | Status: AC
  Filled 2016-01-22: qty 1

## 2016-01-22 MED ORDER — SODIUM CHLORIDE 0.9 % IV BOLUS (SEPSIS)
1000.0000 mL | Freq: Once | INTRAVENOUS | Status: AC
  Administered 2016-01-22: 1000 mL via INTRAVENOUS

## 2016-01-22 NOTE — Progress Notes (Signed)
Patient ID: Michaela Tyler, female   DOB: [birth year], 82 y.o.   MRN: [id number]

## 2016-01-22 NOTE — H&P (Signed)
[facility]          History and Physical    PCP:   Rosita Fire, MD   EDP: Julious Oka, MD  Chief Complaint:  Abnormal lab  HPI: 82 y/o woman sent to the ED from [place] center for a Cr of 4.88. She was recently hospitalized and had a Cr as high as 3.8, but had decreased to 1.96 by time of DC. She remains asymptomatic. She is also hyperkalemic with a K of 5.7.  Allergies:  No Known Allergies    Past Medical History  Diagnosis Date  . Hypertension   . High cholesterol   . GERD (gastroesophageal reflux disease)   . Arthritis   . Anxiety   . Depression   . Cataract   . Hearing reduced   . PONV (postoperative nausea and vomiting)   . H/O left breast biopsy     NO CANCER  . Low back pain   . Chronic right hip pain     Past Surgical History  Procedure Laterality Date  . Total knee arthroplasty  2008    left, [place]  . Elbow surgery      left  . Abdominal hysterectomy    . Knee arthroscopy      right  . Breast lumpectomy  1998    left  . Cataract extraction w/phaco  03/23/2012    Procedure: CATARACT EXTRACTION PHACO AND INTRAOCULAR LENS PLACEMENT (IOC);  Surgeon: Elta Guadeloupe T. Gershon Crane, MD;  Location: [facility];  Service: Ophthalmology;  Laterality: Right;  CDE:12.73  . Cataract extraction w/phaco  04/20/2012    Procedure: CATARACT EXTRACTION PHACO AND INTRAOCULAR LENS PLACEMENT (IOC);  Surgeon: Elta Guadeloupe T. Gershon Crane, MD;  Location: [facility];  Service: Ophthalmology;  Laterality: Left;  CDE:7.70  . Yag laser application Left 12/18/9796    Procedure: YAG LASER APPLICATION;  Surgeon: Elta Guadeloupe T. Gershon Crane, MD;  Location: [facility];  Service: Ophthalmology;  Laterality: Left;  left  . Yag laser application Right 07/17/1940    Procedure: YAG LASER APPLICATION;  Surgeon: Elta Guadeloupe T. Gershon Crane, MD;  Location: [facility];  Service: Ophthalmology;  Laterality: Right;  right    Prior to Admission medications   Medication Sig Start Date End Date Taking? Authorizing Provider    ALPRAZolam (XANAX) 0.25 MG tablet Take 1 tablet (0.25 mg total) by mouth 2 (two) times daily as needed for anxiety. 01/17/16  Yes Hendricks Limes, MD  aspirin EC 81 MG tablet Take 81 mg by mouth daily.   Yes Historical Provider, MD  citalopram (CELEXA) 10 MG tablet Take 10 mg by mouth at bedtime.  09/15/14  Yes Historical Provider, MD  ferrous sulfate 325 (65 FE) MG tablet Take 325 mg by mouth daily with breakfast.   Yes Historical Provider, MD  furosemide (LASIX) 20 MG tablet Take 20 mg by mouth daily.   Yes Historical Provider, MD  levothyroxine (SYNTHROID, LEVOTHROID) 88 MCG tablet Take 1 tablet (88 mcg total) by mouth daily. 01/17/16  Yes Hendricks Limes, MD  losartan (COZAAR) 50 MG tablet Take 50 mg by mouth daily.   Yes Historical Provider, MD  magnesium oxide (MAG-OX) 400 (241.3 Mg) MG tablet Take 1 tablet (400 mg total) by mouth 2 (two) times daily. 01/08/16  Yes Rosita Fire, MD  metoprolol (LOPRESSOR) 50 MG tablet Take 1 tablet (50 mg total) by mouth 2 (two) times daily. 01/08/16  Yes Rosita Fire, MD  omeprazole (  PRILOSEC) 20 MG capsule Take 20 mg by mouth daily. 10/07/14  Yes Historical Provider, MD  traMADol (ULTRAM) 50 MG tablet Take 1 tablet (50 mg total) by mouth every 12 (twelve) hours as needed. 01/17/16  Yes Pierpont, DO    Social History:  reports that she has never smoked. She does not have any smokeless tobacco history on file. She reports that she does not drink alcohol or use illicit drugs.  Family History  Problem Relation Age of Onset  . Colon cancer Neg Hx   . Liver disease Neg Hx     Review of Systems:  Constitutional: Denies fever, chills, diaphoresis, appetite change and fatigue.  HEENT: Denies photophobia, eye pain, redness, hearing loss, ear pain, congestion, sore throat, rhinorrhea, sneezing, mouth sores, trouble swallowing, neck pain, neck stiffness and tinnitus.   Respiratory: Denies SOB, DOE, cough, chest tightness,  and wheezing.   Cardiovascular:  Denies chest pain, palpitations and leg swelling.  Gastrointestinal: Denies nausea, vomiting, abdominal pain, diarrhea, constipation, blood in stool and abdominal distention.  Genitourinary: Denies dysuria, urgency, frequency, hematuria, flank pain and difficulty urinating.  Endocrine: Denies: hot or cold intolerance, sweats, changes in hair or nails, polyuria, polydipsia. Musculoskeletal: Denies myalgias, back pain, joint swelling, arthralgias and gait problem.  Skin: Denies pallor, rash and wound.  Neurological: Denies dizziness, seizures, syncope, weakness, light-headedness, numbness and headaches.  Hematological: Denies adenopathy. Easy bruising, personal or family bleeding history  Psychiatric/Behavioral: Denies suicidal ideation, mood changes, confusion, nervousness, sleep disturbance and agitation   Physical Exam: Blood pressure 92/65, pulse 118, temperature 97.5 F (36.4 C), temperature source Oral, resp. rate 20, height 5' 11"  (1.803 m), weight 114.306 kg (252 lb), SpO2 94 %. Gen: AA Ox3 HEENT: [place]/AT/PERRL Neck: supple, no JVD, no LAD, no bruits, no goiter CV: RRR, no M/R/G Lungs: CTA B Abd: S/NT/ND/+BS Ext: 2+ edema Neuro: moves all 4 spontaneously  Labs on Admission:  Results for orders placed or performed during the hospital encounter of 01/22/16 (from the past 48 hour(s))  CBC with Differential/Platelet     Status: Abnormal   Collection Time: 01/22/16 12:35 PM  Result Value Ref Range   WBC 6.4 4.0 - 10.5 K/uL   RBC 4.19 3.87 - 5.11 MIL/uL   Hemoglobin 12.9 12.0 - 15.0 g/dL   HCT 39.7 36.0 - 46.0 %   MCV 94.7 78.0 - 100.0 fL   MCH 30.8 26.0 - 34.0 pg   MCHC 32.5 30.0 - 36.0 g/dL   RDW 18.3 (H) 11.5 - 15.5 %   Platelets 161 150 - 400 K/uL   Neutrophils Relative % 66 %   Neutro Abs 4.1 1.7 - 7.7 K/uL   Lymphocytes Relative 18 %   Lymphs Abs 1.2 0.7 - 4.0 K/uL   Monocytes Relative 14 %   Monocytes Absolute 0.9 0.1 - 1.0 K/uL   Eosinophils Relative 2 %    Eosinophils Absolute 0.2 0.0 - 0.7 K/uL   Basophils Relative 0 %   Basophils Absolute 0.0 0.0 - 0.1 K/uL  Comprehensive metabolic panel     Status: Abnormal   Collection Time: 01/22/16 12:35 PM  Result Value Ref Range   Sodium 133 (L) 135 - 145 mmol/L   Potassium 5.7 (H) 3.5 - 5.1 mmol/L   Chloride 101 101 - 111 mmol/L   CO2 18 (L) 22 - 32 mmol/L   Glucose, Bld 124 (H) 65 - 99 mg/dL   BUN >100 (H) 6 - 20 mg/dL   Creatinine, Ser 5.32 (  H) 0.44 - 1.00 mg/dL   Calcium 8.5 (L) 8.9 - 10.3 mg/dL   Total Protein 6.0 (L) 6.5 - 8.1 g/dL   Albumin 3.0 (L) 3.5 - 5.0 g/dL   AST 34 15 - 41 U/L   ALT 33 14 - 54 U/L   Alkaline Phosphatase 154 (H) 38 - 126 U/L   Total Bilirubin 1.6 (H) 0.3 - 1.2 mg/dL   GFR calc non Af Amer 6 (L) >60 mL/min   GFR calc Af Amer 7 (L) >60 mL/min    Comment: (NOTE) The eGFR has been calculated using the CKD EPI equation. This calculation has not been validated in all clinical situations. eGFR's persistently <60 mL/min signify possible Chronic Kidney Disease.    Anion gap 14 5 - 15  Lactic acid, plasma     Status: Abnormal   Collection Time: 01/22/16  1:28 PM  Result Value Ref Range   Lactic Acid, Venous 2.9 (HH) 0.5 - 2.0 mmol/L    Comment: CRITICAL RESULT CALLED TO, READ BACK BY AND VERIFIED WITH: OSBOURNE,T AT 1430 BU HUFFINES,S ON 01/22/16.   Urinalysis, Routine w reflex microscopic (not at [facility])     Status: Abnormal   Collection Time: 01/22/16  2:15 PM  Result Value Ref Range   Color, Urine YELLOW YELLOW   APPearance CLOUDY (A) CLEAR   Specific Gravity, Urine 1.025 1.005 - 1.030   pH 6.0 5.0 - 8.0   Glucose, UA NEGATIVE NEGATIVE mg/dL   Hgb urine dipstick MODERATE (A) NEGATIVE   Bilirubin Urine NEGATIVE NEGATIVE   Ketones, ur NEGATIVE NEGATIVE mg/dL   Protein, ur 30 (A) NEGATIVE mg/dL   Nitrite POSITIVE (A) NEGATIVE   Leukocytes, UA LARGE (A) NEGATIVE  Urine microscopic-add on     Status: Abnormal   Collection Time: 01/22/16  2:15 PM  Result Value  Ref Range   Squamous Epithelial / LPF 0-5 (A) NONE SEEN   WBC, UA TOO NUMEROUS TO COUNT 0 - 5 WBC/hpf   RBC / HPF 6-30 0 - 5 RBC/hpf   Bacteria, UA MANY (A) NONE SEEN    Radiological Exams on Admission: No results found.  Assessment/Plan Principal Problem:   AKI (acute kidney injury) ([place]) Active Problems:   Hypothyroidism   UTI (urinary tract infection)   Chronic combined systolic and diastolic CHF (congestive heart failure) ([place])   ARF (acute renal failure) ([place])     ARF -IVF mindful of depressed EF. -Recheck renal function in am. -Consider renal US and nephrology consult in am if Cr worsening.  Hyperkalemia -No EKG changes. -Given insulin and D50. -Recheck in am.  UTI -Rocephin pending cx data.  Hypothyroidism -Continue synthroid.   Time Spent on Admission: 75 minutes  HERNANDEZ Tyler,Michaela [facility] Pager: [phone] 01/22/2016, 9:31 PM

## 2016-01-22 NOTE — Progress Notes (Signed)
Patient ID: Michaela Tyler, female   DOB: [birth year], 82 y.o.   MRN: [id number]  Location:  [facility]   Place of Service:  SNF (31)   Avon Gully, MD  Patient Care Team: Avon Gully, MD as PCP - General (Internal Medicine)  Extended Emergency Contact Information Primary Emergency Contact: [name] Mobile Phone: [phone] Relation: Niece Secondary Emergency Contact: Pitz,Carroll  [place] Mobile Phone: [phone] Relation: Spouse  Goals of care: Advanced Directive information Advanced Directives 01/22/2016  Does patient have an advance directive? Yes  Type of Advance Directive Out of facility DNR (pink MOST or yellow form)  Does patient want to make changes to advanced directive? No - Patient declined  Copy of advanced directive(s) in chart? Yes  Would patient like information on creating an advanced directive? -     Chief Complaint  Patient presents with  . Acute Visit   secondary to acute renal insufficiency hyperkalemia  HPI:  Pt is a 82 y.o. female seen today for acute renal insufficiency with a creatinine on lab of 4.88-potassium noted to be 5.3.  Patient is a recent admission to this facilityAnd had been hospitalized for atrial fibrillation with rapid ventricular rate-also was found to have acute renal insufficiency with a creatinine of 3.36 at one point-complicated somewhat with a history of systolic CHF.  Apparently she was aggressively hydrated and creatinine last week was 1.96.  She was seen last week for increased lethargy and adjustments were made to her Xanax and Norco-she is more alert and interactive per nursing.  However lab drawn yesterday is concerning for a creatinine that has risen up to 4.88 with potassium of 5.3 I see listed blood pressure of systolic in the 140s however I got 82/62 when checked manually.  Her pulse is also mildly elevated I got 104 on exam today she is on Lopressor  for rate control  She appears to be stable is alert talking smiling --she does  not complain of chest pain or shortness of breath. Or increased weakness although she appears to be somewhat of a poor historian  According nursing apparently she's been eating and drinking fairly well although certainly this lab is concerning.  She is on Lasix 20 mg a day.     Past Medical History  Diagnosis Date  . Hypertension   . High cholesterol   . GERD (gastroesophageal reflux disease)   . Arthritis   . Anxiety   . Depression   . Cataract   . Hearing reduced   . PONV (postoperative nausea and vomiting)   . H/O left breast biopsy     NO CANCER  . Low back pain   . Chronic right hip pain    Past Surgical History  Procedure Laterality Date  . Total knee arthroplasty  2008    left, [facility]  . Elbow surgery      left  . Abdominal hysterectomy    . Knee arthroscopy      right  . Breast lumpectomy  1998    left  . Cataract extraction w/phaco  03/23/2012    Procedure: CATARACT EXTRACTION PHACO AND INTRAOCULAR LENS PLACEMENT (IOC);  Surgeon: Loraine Leriche T. Nile Riggs, MD;  Location: [facility];  Service: Ophthalmology;  Laterality: Right;  CDE:12.73  . Cataract extraction w/phaco  04/20/2012    Procedure: CATARACT EXTRACTION PHACO AND INTRAOCULAR LENS PLACEMENT (IOC);  Surgeon: Loraine Leriche T. Nile Riggs, MD;  Location: [facility];  Service: Ophthalmology;  Laterality: Left;  CDE:7.70  . Yag laser application Left 11/07/2014    Procedure: YAG LASER APPLICATION;  Surgeon: Loraine Leriche T. Nile Riggs, MD;  Location: [facility];  Service: Ophthalmology;  Laterality: Left;  left  . Yag laser application Right 11/21/2014    Procedure: YAG LASER APPLICATION;  Surgeon: Loraine Leriche T. Nile Riggs, MD;  Location: [facility];  Service: Ophthalmology;  Laterality: Right;  right    No Known Allergies  No current facility-administered medications on file prior to visit.   Current Outpatient Prescriptions on File Prior to Visit  Medication Sig Dispense Refill  .  ALPRAZolam (XANAX) 0.25 MG tablet Take 1 tablet (0.25 mg total) by mouth 2 (two) times daily as needed for anxiety. 20 tablet 0  . aspirin EC 81 MG tablet Take 81 mg by mouth daily.    . citalopram (CELEXA) 10 MG tablet Take 10 mg by mouth at bedtime.   3  . ferrous sulfate 325 (65 FE) MG tablet Take 325 mg by mouth daily with breakfast.    . furosemide (LASIX) 20 MG tablet Take 20 mg by mouth daily.    Marland Kitchen levothyroxine (SYNTHROID, LEVOTHROID) 88 MCG tablet Take 1 tablet (88 mcg total) by mouth daily. 90 tablet 3  . losartan (COZAAR) 50 MG tablet Take 50 mg by mouth daily.    . magnesium oxide (MAG-OX) 400 (241.3 Mg) MG tablet Take 1 tablet (400 mg total) by mouth 2 (two) times daily. 60 tablet 3  . metoprolol (LOPRESSOR) 50 MG tablet Take 1 tablet (50 mg total) by mouth 2 (two) times daily. 60 tablet 3  . omeprazole (PRILOSEC) 20 MG capsule Take 20 mg by mouth daily.  3  . traMADol (ULTRAM) 50 MG tablet Take 1 tablet (50 mg total) by mouth every 12 (twelve) hours as needed. 60 tablet 0     Review of Systems limited somewhat patient appears to be a poor historian obtain from patient and staff.  In general is not complaining of fever chills.  Respiratory is not complaining of shortness of breath or cough.  Cardiac denies any chest pain does have significant lower extremity edema-lymphedema.  GI does not complain of abdominal discomfort no nausea or vomiting noted by nursing staff.  Muscle skeletal does not complain of leg pain or joint pain at this time.  Neurologic does not complain of dizziness headache or syncopal-type feelings.    Immunization History  Administered Date(s) Administered  . Tdap 02/07/2012   Pertinent  Health Maintenance Due  Topic Date Due  . DEXA SCAN  03/07/1988  . PNA vac Low Risk Adult (1 of 2 - PCV13) 03/07/1988  . INFLUENZA VACCINE  05/28/2015   No flowsheet data found. Functional Status Survey:    Filed Vitals:   01/22/16 1641  BP: 89/62  Pulse:  164  Resp: 16   There is no weight on file to calculate BMI. Physical Exam   In general this is a pleasant elderly female in no distress actually appears to be resting in bed fairly comfortably.  Skin-turgor appears to be somewhat impaired.  Eyes pupils appear reactive sclera and conjunctiva are clear.  Oropharynx--she is edentulous has dentures.  Chest is clear to auscultation with somewhat poor respiratory effort.  Heart is tachycardic without murmur gallop or rub I would say she has 2+ lower extremity edema-lymphedema pedal pulses are somewhat reduced I suspect secondary to the edema.  Her abdomen is obese soft nontender with positive bowel sounds.  Musculoskeletal has significant lower extremity edema appears able  to move her extremities 4 with significant lower extremity weakness which I suspect may be baseline.  Neurologic is grossly intact she does speak is pleasant smiling I cannot really appreciate lateralizing findings.  Psych she is mostly oriented pleasant and conversant although weak appearing.    Labs reviewed:  Recent Labs  12/31/15 1012  01/09/16 0755 01/21/16 0630 01/22/16 1235  NA  --   < > 135 135 133*  K  --   < > 4.0 5.3* 5.7*  CL  --   < > 102 102 101  CO2  --   < > 23 21* 18*  GLUCOSE  --   < > 82 96 124*  BUN  --   < > 34* 97* >100*  CREATININE  --   < > 1.96* 4.88* 5.32*  CALCIUM  --   < > 8.8* 8.5* 8.5*  MG 1.4*  --   --   --   --   < > = values in this interval not displayed.  Recent Labs  01/09/16 0755 01/21/16 0630 01/22/16 1235  AST 42* 28 34  ALT 87* 31 33  ALKPHOS 144* 149* 154*  BILITOT 1.9* 1.5* 1.6*  PROT 6.1* 5.5* 6.0*  ALBUMIN 3.1* 2.7* 3.0*    Recent Labs  07/13/15 1520 12/30/15 1830  01/08/16 0108 01/09/16 0755 01/22/16 1235  WBC 5.2 5.7  < > 6.0 4.9 6.4  NEUTROABS 3.9 3.9  --   --   --  4.1  HGB 10.2* 12.4  < > 12.5 13.3 12.9  HCT 32.0* 38.3  < > 38.7 41.4 39.7  MCV 91.2 94.3  < > 94.6 95.6 94.7  PLT 180  154  < > 169 164 161  < > = values in this interval not displayed. Lab Results  Component Value Date   TSH 6.674* 12/31/2015   No results found for: HGBA1C No results found for: CHOL, HDL, LDLCALC, LDLDIRECT, TRIG, CHOLHDL  Significant Diagnostic Results in last 30 days:  Dg Chest 2 View  12/30/2015  CLINICAL DATA:  Weakness, leg swelling EXAM: CHEST  2 VIEW COMPARISON:  CTA chest dated 07/13/2015 FINDINGS: Cardiomegaly with mild interstitial/perihilar edema. Suspected small right pleural effusion. No pneumothorax. Mild degenerative changes of the visualized thoracolumbar spine. IMPRESSION: Cardiomegaly with mild interstitial edema and suspected small right pleural effusion. Electronically Signed   By: Charline Bills M.D.   On: 12/30/2015 19:23   US Abdomen Complete  01/04/2016  CLINICAL DATA:  Patient with abnormal LFTs.  Prior hysterectomy. EXAM: ABDOMEN ULTRASOUND COMPLETE COMPARISON:  Chest CT 07/13/2015 FINDINGS: Gallbladder: The gallbladder is mildly contracted. Gallbladder wall thickening upper limits of normal. No pericholecystic fluid. Negative sonographic Murphy's sign. No definite gallstones identified. Common bile duct: Diameter: 2 mm Liver: No focal lesion identified. Within normal limits in parenchymal echogenicity. IVC: No abnormality visualized. Pancreas: Visualized portion unremarkable. Spleen: Size and appearance within normal limits. Right Kidney: Length: 11.0 cm. There is a 1.7 cm cyst within the superior pole. Normal renal cortical thickness echogenicity. No hydronephrosis. Left Kidney: Length: 10.3 cm. Echogenicity within normal limits. No mass or hydronephrosis visualized. Abdominal aorta: No aneurysm visualized. Other findings: None. IMPRESSION: No acute process within the abdomen. No cholelithiasis or sonographic evidence for acute cholecystitis. No biliary ductal dilatation. Electronically Signed   By: Annia Belt M.D.   On: 01/04/2016 08:48   Dg Chest Port 1  View  01/01/2016  CLINICAL DATA:  82 year old female with weakness and shortness of breath. EXAM:  PORTABLE CHEST 1 VIEW COMPARISON:  12/30/2015 and prior exams FINDINGS: This is a mildly low volume film. Cardiomegaly and pulmonary vascular congestion again noted. Elevation of the right hemidiaphragm is unchanged. There is no evidence of focal airspace disease, pulmonary edema, suspicious pulmonary nodule/mass, pleural effusion, or pneumothorax. No acute bony abnormalities are identified. IMPRESSION: Low volume film with cardiomegaly and pulmonary vascular congestion. Electronically Signed   By: Harmon PierJeffrey  Hu M.D.   On: 01/01/2016 11:11   Dg Hip Unilat With Pelvis 2-3 Views Right  12/28/2015  CLINICAL DATA:  Pain for 2 weeks.  No history of recent trauma EXAM: DG HIP (WITH OR WITHOUT PELVIS) 2-3V RIGHT COMPARISON:  None. FINDINGS: Frontal pelvis as well as frontal and lateral right hip images were obtained. There is no demonstrable fracture or dislocation. There is mild symmetric narrowing of both hip joints. No erosive change. There is degenerative change in each sacroiliac joint and pubic symphysis regions as well. IMPRESSION: Degenerative type/osteoarthritic change in the visualized joint spaces. No fracture or dislocation. Electronically Signed   By: Bretta BangWilliam  Woodruff III M.D.   On: 12/28/2015 13:43    Assessment/Plan  #1 acute kidney injury with creatinine 4.88-this was discussed with Dr. Alwyn RenHopper who is in the facility-nursing also did call nephrology-we will send her to the ER for expedient evaluation Patient's potassium also appears to rising.  She is also slightly tachycardic-she does have a history of this.  Clinically she appears to be relatively stable per serial exams before going to the ER heart rate continued to be mildly tachycardic-mental status was stable with no complaints of chest pain dizziness syncopal-type feelings.--She is bright and alert but continues to be weak appearing.  Again  will await Hospital evaluation I suspect she will be admitted  [id number]

## 2016-01-22 NOTE — ED Provider Notes (Addendum)
CSN: [id number]     Arrival date & time 01/22/16  1159 History  By signing my name below, I, Ronney Lion, attest that this documentation has been prepared under the direction and in the presence of Bethann Berkshire, MD. Electronically Signed: Ronney Lion, ED Scribe. 01/22/2016. 1:02 PM.    Chief Complaint  Patient presents with  . Abnormal Lab   Patient is a 82 y.o. female presenting with weakness. The history is provided by the patient (Patient states that she had blood drawn yesterday that showed her kidney function was elevated so she was sent over here). No language interpreter was used.  Weakness This is a new problem. The current episode started yesterday. The problem occurs constantly. The problem has not changed since onset.Pertinent negatives include no chest pain, no abdominal pain and no headaches. Nothing aggravates the symptoms. Nothing relieves the symptoms.   HPI Comments: Michaela Tyler is a 82 y.o. female with a history of hypertension, hyperlipidemia, GERD, anxiety, depression, brought in by ambulance, who presents to the Emergency Department for an elevated creatinine; patient was sent here from the [facility]. Medical records show that patient has a creatinine level of 4.88 today, up from a level of 1.96, last taken 13 days ago. Patient herself complains of severe pain in her bilateral buttocks. She also notes associated leg swelling.    Past Medical History  Diagnosis Date  . Hypertension   . High cholesterol   . GERD (gastroesophageal reflux disease)   . Arthritis   . Anxiety   . Depression   . Cataract   . Hearing reduced   . PONV (postoperative nausea and vomiting)   . H/O left breast biopsy     NO CANCER  . Low back pain   . Chronic right hip pain    Past Surgical History  Procedure Laterality Date  . Total knee arthroplasty  2008    left, [facility]  . Elbow surgery      left  . Abdominal hysterectomy    . Knee arthroscopy      right  . Breast lumpectomy  1998     left  . Cataract extraction w/phaco  03/23/2012    Procedure: CATARACT EXTRACTION PHACO AND INTRAOCULAR LENS PLACEMENT (IOC);  Surgeon: Loraine Leriche T. Nile Riggs, MD;  Location: [facility];  Service: Ophthalmology;  Laterality: Right;  CDE:12.73  . Cataract extraction w/phaco  04/20/2012    Procedure: CATARACT EXTRACTION PHACO AND INTRAOCULAR LENS PLACEMENT (IOC);  Surgeon: Loraine Leriche T. Nile Riggs, MD;  Location: [facility];  Service: Ophthalmology;  Laterality: Left;  CDE:7.70  . Yag laser application Left 11/07/2014    Procedure: YAG LASER APPLICATION;  Surgeon: Loraine Leriche T. Nile Riggs, MD;  Location: [facility];  Service: Ophthalmology;  Laterality: Left;  left  . Yag laser application Right 11/21/2014    Procedure: YAG LASER APPLICATION;  Surgeon: Loraine Leriche T. Nile Riggs, MD;  Location: [facility];  Service: Ophthalmology;  Laterality: Right;  right   Family History  Problem Relation Age of Onset  . Colon cancer Neg Hx   . Liver disease Neg Hx    Social History  Substance Use Topics  . Smoking status: Never Smoker   . Smokeless tobacco: None  . Alcohol Use: No   OB History    No data available     Review of Systems  Constitutional: Negative for appetite change and fatigue.  HENT: Negative for congestion, ear discharge and sinus pressure.   Eyes: Negative for discharge.  Respiratory: Negative for  cough.   Cardiovascular: Positive for leg swelling. Negative for chest pain.  Gastrointestinal: Negative for abdominal pain and diarrhea.  Genitourinary: Negative for frequency and hematuria.  Musculoskeletal: Positive for myalgias (pain in bilateral buttocks). Negative for back pain.  Skin: Negative for rash.  Neurological: Positive for weakness. Negative for seizures and headaches.  Psychiatric/Behavioral: Negative for hallucinations.    Allergies  Review of patient's allergies indicates no known allergies.  Home Medications   Prior to Admission medications   Medication Sig Start Date End Date Taking? Authorizing Provider   ALPRAZolam (XANAX) 0.25 MG tablet Take 1 tablet (0.25 mg total) by mouth 2 (two) times daily as needed for anxiety. 01/17/16  Yes Pecola Lawless, MD  aspirin EC 81 MG tablet Take 81 mg by mouth daily.   Yes Historical Provider, MD  citalopram (CELEXA) 10 MG tablet Take 10 mg by mouth at bedtime.  09/15/14  Yes Historical Provider, MD  ferrous sulfate 325 (65 FE) MG tablet Take 325 mg by mouth daily with breakfast.   Yes Historical Provider, MD  furosemide (LASIX) 20 MG tablet Take 20 mg by mouth daily.   Yes Historical Provider, MD  levothyroxine (SYNTHROID, LEVOTHROID) 88 MCG tablet Take 1 tablet (88 mcg total) by mouth daily. 01/17/16  Yes Pecola Lawless, MD  losartan (COZAAR) 50 MG tablet Take 50 mg by mouth daily.   Yes Historical Provider, MD  magnesium oxide (MAG-OX) 400 (241.3 Mg) MG tablet Take 1 tablet (400 mg total) by mouth 2 (two) times daily. 01/08/16  Yes Avon Gully, MD  metoprolol (LOPRESSOR) 50 MG tablet Take 1 tablet (50 mg total) by mouth 2 (two) times daily. 01/08/16  Yes Avon Gully, MD  omeprazole (PRILOSEC) 20 MG capsule Take 20 mg by mouth daily. 10/07/14  Yes Historical Provider, MD  traMADol (ULTRAM) 50 MG tablet Take 1 tablet (50 mg total) by mouth every 12 (twelve) hours as needed. 01/17/16  Yes Tiffany L Reed, DO   BP 107/69 mmHg  Pulse 114  Temp(Src) 97.6 F (36.4 C) (Oral)  Resp 18  Ht  (1.803 m)  Wt 252 lb (114.306 kg)  BMI 35.16 kg/m2  SpO2 94% Physical Exam  Constitutional: She is oriented to person, place, and time. She appears well-developed.  HENT:  Head: Normocephalic.  Eyes: Conjunctivae and EOM are normal. No scleral icterus.  Neck: Neck supple. No thyromegaly present.  Cardiovascular: Regular rhythm.  Tachycardia present.  Exam reveals no gallop and no friction rub.   No murmur heard. Tachycardic.  Pulmonary/Chest: No stridor. She has no wheezes. She has no rales. She exhibits no tenderness.  Abdominal: She exhibits no distension.  There is no tenderness. There is no rebound.  Musculoskeletal: Normal range of motion. She exhibits edema (2+ edema in BLE.).  Lymphadenopathy:    She has no cervical adenopathy.  Neurological: She is oriented to person, place, and time. She exhibits normal muscle tone. Coordination normal.  Skin: Rash noted. There is erythema.  Large rash on buttocks, that is red and irritated  Psychiatric: She has a normal mood and affect. Her behavior is normal.  Nursing note and vitals reviewed.   ED Course  Procedures (including critical care time)  DIAGNOSTIC STUDIES: Oxygen Saturation is 94% on RA, adequate by my interpretation.    COORDINATION OF CARE: 12:57 PM - Discussed treatment plan with pt at bedside. Pt verbalized understanding and agreed to plan.   Labs Review Labs Reviewed - No data to display  Imaging  Review No results found. I have personally reviewed and evaluated these images and lab results as part of my medical decision-making.   EKG Interpretation   Date/Time:  Tuesday January 22 2016 12:51:07 EDT Ventricular Rate:  115 PR Interval:    QRS Duration: 165 QT Interval:  405 QTC Calculation: 560 R Axis:   -140 Text Interpretation:  Age not entered, assumed to be  82 years old for  purpose of ECG interpretation Atrial flutter with predominant 2:1 AV block  Nonspecific intraventricular conduction delay Borderline repolarization  abnormality Confirmed by Fabiano Ginley  MD, Kalvin Buss [phone]([id number]) on 01/22/2016 12:56:33  PM     CRITICAL CARE Performed by: Alexandrea Westergard L Total critical care time:40 minutes Critical care time was exclusive of separately billable procedures and treating other patients. Critical care was necessary to treat or prevent imminent or life-threatening deterioration. Critical care was time spent personally by me on the following activities: development of treatment plan with patient and/or surrogate as well as nursing, discussions with consultants, evaluation of  patient's response to treatment, examination of patient, obtaining history from patient or surrogate, ordering and performing treatments and interventions, ordering and review of laboratory studies, ordering and review of radiographic studies, pulse oximetry and re-evaluation of patient's condition.   MDM   Final diagnoses:  None    Patient with acute kidney injury. Hyperkalemia. She will be treated with fluids and also given insulin and glucose for her hyperkalemia. Patient will be admitted to telemetry. Patient is a DO NOT RESUSCITATE  The chart was scribed for me under my direct supervision.  I personally performed the history, physical, and medical decision making and all procedures in the evaluation of this patient.Bethann Berkshire.   Brenlyn Beshara, MD 01/22/16 1447  Bethann BerkshireJoseph Amilio Zehnder, MD 01/22/16 [id number]

## 2016-01-22 NOTE — ED Notes (Signed)
[facility]

## 2016-01-22 NOTE — ED Notes (Signed)
CRITICAL VALUE ALERT  Critical value received:  Lactic Acid  Date of notification:  01/22/16  Time of notification:  1433  Critical value read back:Yes.    Nurse who received alert:  Ian Castagna,RN  MD notified (1st page):  Dr Estell HarpinZammit  Time of first page:  [phone]

## 2016-01-23 ENCOUNTER — Inpatient Hospital Stay (HOSPITAL_COMMUNITY): Payer: Medicare Other

## 2016-01-23 LAB — BASIC METABOLIC PANEL
Anion gap: 11 (ref 5–15)
BUN: 96 mg/dL — ABNORMAL HIGH (ref 6–20)
CALCIUM: 8 mg/dL — AB (ref 8.9–10.3)
CHLORIDE: 105 mmol/L (ref 101–111)
CO2: 19 mmol/L — ABNORMAL LOW (ref 22–32)
CREATININE: 4.7 mg/dL — AB (ref 0.44–1.00)
GFR calc non Af Amer: 7 mL/min — ABNORMAL LOW (ref >60)
GFR, EST AFRICAN AMERICAN: 8 mL/min — AB (ref >60)
Glucose, Bld: 90 mg/dL (ref 65–99)
Potassium: 5 mmol/L (ref 3.5–5.1)
Sodium: 135 mmol/L (ref 135–145)

## 2016-01-23 LAB — MAGNESIUM: Magnesium: 2.8 mg/dL — ABNORMAL HIGH (ref 1.7–2.4)

## 2016-01-23 LAB — CBC
HCT: 38.4 % (ref 36.0–46.0)
Hemoglobin: 12.6 g/dL (ref 12.0–15.0)
MCH: 31 pg (ref 26.0–34.0)
MCHC: 32.8 g/dL (ref 30.0–36.0)
MCV: 94.6 fL (ref 78.0–100.0)
Platelets: 148 K/uL — ABNORMAL LOW (ref 150–400)
RBC: 4.06 MIL/uL (ref 3.87–5.11)
RDW: 18.2 % — ABNORMAL HIGH (ref 11.5–15.5)
WBC: 5.8 K/uL (ref 4.0–10.5)

## 2016-01-23 LAB — SEDIMENTATION RATE: Sed Rate: 5 mm/hr (ref 0–22)

## 2016-01-23 MED ORDER — SODIUM CHLORIDE 0.9 % IV BOLUS (SEPSIS)
250.0000 mL | Freq: Once | INTRAVENOUS | Status: AC
  Administered 2016-01-23: 250 mL via INTRAVENOUS

## 2016-01-23 MED ORDER — FUROSEMIDE 40 MG PO TABS
40.0000 mg | ORAL_TABLET | Freq: Two times a day (BID) | ORAL | Status: DC
  Administered 2016-01-23 – 2016-01-24 (×3): 40 mg via ORAL
  Filled 2016-01-23 (×3): qty 1

## 2016-01-23 NOTE — NC FL2 (Signed)
[place] MEDICAID FL2 LEVEL OF CARE SCREENING TOOL     IDENTIFICATION  Patient Name: Michaela Tyler Birthdate: 20-Oct-1921 Sex: female Admission Date (Current Location): 01/22/2016  [facility] [place]   Facility and Address:  [facility],  [address], [place] [id number]      Provider Number: [id number]  Attending Physician Name and Address:  Avon Gullyesfaye Fanta, MD  Relative Name and Phone Number:       Current Level of Care: Hospital Recommended Level of Care: Skilled Nursing Facility Prior Approval Number:    Date Approved/Denied:   PASRR Number:    Discharge Plan: SNF ([facility])    Current Diagnoses: Patient Active Problem List   Diagnosis Date Noted  . UTI (urinary tract infection) 01/22/2016  . Chronic combined systolic and diastolic CHF (congestive heart failure) (HCC) 01/22/2016  . ARF (acute renal failure) (HCC) 01/22/2016  . Hypothyroidism 01/17/2016  . Abnormal liver function test   . Atrial fibrillation with rapid ventricular response (HCC)   . Elevated troponin   . Arterial hypotension   . Acute on chronic systolic CHF (congestive heart failure) (HCC)   . AKI (acute kidney injury) (HCC) 12/30/2015  . Chronic atrial fibrillation (HCC) 01/12/2013  . Left bundle branch block 01/12/2013  . At high risk for falls 01/12/2013    Orientation RESPIRATION BLADDER Height & Weight     Self, Situation, Place  Normal Incontinent Weight: 252 lb (114.306 kg) Height:  5\' 11"  (180.3 cm)  BEHAVIORAL SYMPTOMS/MOOD NEUROLOGICAL BOWEL NUTRITION STATUS  Other (Comment) (none noted)   Continent Diet (regular)  AMBULATORY STATUS COMMUNICATION OF NEEDS Skin   Extensive Assist Verbally Normal                       Personal Care Assistance Level of Assistance  Bathing, Feeding, Dressing Bathing Assistance: Limited assistance Feeding assistance: Limited assistance Dressing Assistance: Limited assistance      Functional Limitations Info  Sight, Hearing, Speech Sight Info: Impaired Hearing Info: Impaired Speech Info: Adequate    SPECIAL CARE FACTORS FREQUENCY  PT (By licensed PT), OT (By licensed OT)     PT Frequency: 5 OT Frequency: 5            Contractures Contractures Info: Not present    Additional Factors Info  Code Status, Allergies, Psychotropic, Insulin Sliding Scale, Isolation Precautions Code Status Info: Full Allergies Info: NKA Psychotropic Info: Celexa Insulin Sliding Scale Info: None Isolation Precautions Info: None     Current Medications (01/23/2016):  This is the current hospital active medication list Current Facility-Administered Medications  Medication Dose Route Frequency Provider Last Rate Last Dose  . 0.9 %  sodium chloride infusion   Intravenous Continuous Henderson CloudEstela Y Hernandez Acosta, MD 75 mL/hr at 01/23/16 0100    . acetaminophen (TYLENOL) tablet 650 mg  650 mg Oral Q6H PRN Henderson CloudEstela Y Hernandez Acosta, MD       Or  . acetaminophen (TYLENOL) suppository 650 mg  650 mg Rectal Q6H PRN Henderson CloudEstela Y Hernandez Acosta, MD      . aspirin EC tablet 81 mg  81 mg Oral Daily Henderson CloudEstela Y Hernandez Acosta, MD   81 mg at 01/23/16 1107  . cefTRIAXone (ROCEPHIN) 1 g in dextrose 5 % 50 mL IVPB  1 g Intravenous Q24H Henderson CloudEstela Y Hernandez Acosta, MD      . citalopram (CELEXA) tablet 10 mg  10 mg Oral QHS Micael HampshireEstela Y Hernandez  Loreta Ave, MD   10 mg at 01/22/16 2228  . heparin injection 5,000 Units  5,000 Units Subcutaneous 3 times per day Henderson Cloud, MD   5,000 Units at 01/23/16 0600  . levothyroxine (SYNTHROID, LEVOTHROID) tablet 88 mcg  88 mcg Oral QAC breakfast Henderson Cloud, MD   88 mcg at 01/23/16 1107  . ondansetron (ZOFRAN) tablet 4 mg  4 mg Oral Q6H PRN Henderson Cloud, MD       Or  . ondansetron [facility]) injection 4 mg  4 mg Intravenous Q6H PRN Henderson Cloud, MD      . pantoprazole (PROTONIX) EC tablet 40 mg  40 mg Oral Daily Henderson Cloud, MD   40 mg at 01/23/16 1107  . senna-docusate (Senokot-S) tablet 1 tablet  1 tablet Oral QHS PRN Henderson Cloud, MD      . sodium chloride flush (NS) 0.9 % injection 3 mL  3 mL Intravenous Q12H Estela Isaiah Blakes, MD   3 mL at 01/23/16 1107     Discharge Medications: Please see discharge summary for a list of discharge medications.  Relevant Imaging Results:  Relevant Lab Results:   Additional Information    Raye Sorrow, LCSW

## 2016-01-23 NOTE — Progress Notes (Signed)
Subjective: Patient was admitted yesterday from [facility] due to acute kidney injury. Patient also has UTI. She is confused and disoriented.  Objective: Vital signs in last 24 hours: Temp:  [97.4 F (36.3 C)-97.6 F (36.4 C)] 97.4 F (36.3 C) (03/29 0500) Pulse Rate:  [104-118] 110 (03/29 0500) Resp:  [16-22] 18 (03/29 0500) BP: (89-107)/(62-75) 93/65 mmHg (03/29 0500) SpO2:  [93 %-100 %] 94 % (03/29 0500) Weight:  [114.306 kg (252 lb)] 114.306 kg (252 lb) (03/28 1623) Weight change:  Last BM Date: 01/22/16  Intake/Output from previous day: 03/28 0701 - 03/29 0700 In: 2240 [P.O.:240; I.V.:2000] Out: -   PHYSICAL EXAM General appearance: no distress, moderately obese and slowed mentation Resp: clear to auscultation bilaterally Cardio: S1, S2 normal GI: soft, non-tender; bowel sounds normal; no masses,  no organomegaly Extremities: extremities normal, atraumatic, no cyanosis or edema  Lab Results:  Results for orders placed or performed during the hospital encounter of 01/22/16 (from the past 48 hour(s))  CBC with Differential/Platelet     Status: Abnormal   Collection Time: 01/22/16 12:35 PM  Result Value Ref Range   WBC 6.4 4.0 - 10.5 K/uL   RBC 4.19 3.87 - 5.11 MIL/uL   Hemoglobin 12.9 12.0 - 15.0 g/dL   HCT 39.7 36.0 - 46.0 %   MCV 94.7 78.0 - 100.0 fL   MCH 30.8 26.0 - 34.0 pg   MCHC 32.5 30.0 - 36.0 g/dL   RDW 18.3 (H) 11.5 - 15.5 %   Platelets 161 150 - 400 K/uL   Neutrophils Relative % 66 %   Neutro Abs 4.1 1.7 - 7.7 K/uL   Lymphocytes Relative 18 %   Lymphs Abs 1.2 0.7 - 4.0 K/uL   Monocytes Relative 14 %   Monocytes Absolute 0.9 0.1 - 1.0 K/uL   Eosinophils Relative 2 %   Eosinophils Absolute 0.2 0.0 - 0.7 K/uL   Basophils Relative 0 %   Basophils Absolute 0.0 0.0 - 0.1 K/uL  Comprehensive metabolic panel     Status: Abnormal   Collection Time: 01/22/16 12:35 PM  Result Value Ref Range   Sodium 133 (L) 135 - 145 mmol/L   Potassium 5.7 (H) 3.5 - 5.1  mmol/L   Chloride 101 101 - 111 mmol/L   CO2 18 (L) 22 - 32 mmol/L   Glucose, Bld 124 (H) 65 - 99 mg/dL   BUN >100 (H) 6 - 20 mg/dL   Creatinine, Ser 5.32 (H) 0.44 - 1.00 mg/dL   Calcium 8.5 (L) 8.9 - 10.3 mg/dL   Total Protein 6.0 (L) 6.5 - 8.1 g/dL   Albumin 3.0 (L) 3.5 - 5.0 g/dL   AST 34 15 - 41 U/L   ALT 33 14 - 54 U/L   Alkaline Phosphatase 154 (H) 38 - 126 U/L   Total Bilirubin 1.6 (H) 0.3 - 1.2 mg/dL   GFR calc non Af Amer 6 (L) >60 mL/min   GFR calc Af Amer 7 (L) >60 mL/min    Comment: (NOTE) The eGFR has been calculated using the CKD EPI equation. This calculation has not been validated in all clinical situations. eGFR's persistently <60 mL/min signify possible Chronic Kidney Disease.    Anion gap 14 5 - 15  Lactic acid, plasma     Status: Abnormal   Collection Time: 01/22/16  1:28 PM  Result Value Ref Range   Lactic Acid, Venous 2.9 (HH) 0.5 - 2.0 mmol/L    Comment: CRITICAL RESULT CALLED TO, READ BACK  BY AND VERIFIED WITH: OSBOURNE,T AT 1430 BU HUFFINES,S ON 01/22/16.   Urinalysis, Routine w reflex microscopic (not at [facility])     Status: Abnormal   Collection Time: 01/22/16  2:15 PM  Result Value Ref Range   Color, Urine YELLOW YELLOW   APPearance CLOUDY (A) CLEAR   Specific Gravity, Urine 1.025 1.005 - 1.030   pH 6.0 5.0 - 8.0   Glucose, UA NEGATIVE NEGATIVE mg/dL   Hgb urine dipstick MODERATE (A) NEGATIVE   Bilirubin Urine NEGATIVE NEGATIVE   Ketones, ur NEGATIVE NEGATIVE mg/dL   Protein, ur 30 (A) NEGATIVE mg/dL   Nitrite POSITIVE (A) NEGATIVE   Leukocytes, UA LARGE (A) NEGATIVE  Urine microscopic-add on     Status: Abnormal   Collection Time: 01/22/16  2:15 PM  Result Value Ref Range   Squamous Epithelial / LPF 0-5 (A) NONE SEEN   WBC, UA TOO NUMEROUS TO COUNT 0 - 5 WBC/hpf   RBC / HPF 6-30 0 - 5 RBC/hpf   Bacteria, UA MANY (A) NONE SEEN  CBC     Status: Abnormal   Collection Time: 01/23/16  6:25 AM  Result Value Ref Range   WBC 5.8 4.0 - 10.5 K/uL    RBC 4.06 3.87 - 5.11 MIL/uL   Hemoglobin 12.6 12.0 - 15.0 g/dL   HCT 38.4 36.0 - 46.0 %   MCV 94.6 78.0 - 100.0 fL   MCH 31.0 26.0 - 34.0 pg   MCHC 32.8 30.0 - 36.0 g/dL   RDW 18.2 (H) 11.5 - 15.5 %   Platelets 148 (L) 150 - 400 K/uL  Basic metabolic panel     Status: Abnormal   Collection Time: 01/23/16  6:25 AM  Result Value Ref Range   Sodium 135 135 - 145 mmol/L   Potassium 5.0 3.5 - 5.1 mmol/L   Chloride 105 101 - 111 mmol/L   CO2 19 (L) 22 - 32 mmol/L   Glucose, Bld 90 65 - 99 mg/dL   BUN 96 (H) 6 - 20 mg/dL   Creatinine, Ser 4.70 (H) 0.44 - 1.00 mg/dL   Calcium 8.0 (L) 8.9 - 10.3 mg/dL   GFR calc non Af Amer 7 (L) >60 mL/min   GFR calc Af Amer 8 (L) >60 mL/min    Comment: (NOTE) The eGFR has been calculated using the CKD EPI equation. This calculation has not been validated in all clinical situations. eGFR's persistently <60 mL/min signify possible Chronic Kidney Disease.    Anion gap 11 5 - 15  Magnesium     Status: Abnormal   Collection Time: 01/23/16  6:25 AM  Result Value Ref Range   Magnesium 2.8 (H) 1.7 - 2.4 mg/dL    ABGS No results for input(s): PHART, PO2ART, TCO2, HCO3 in the last 72 hours.  Invalid input(s): PCO2 CULTURES No results found for this or any previous visit (from the past 240 hour(s)). Studies/Results: No results found.  Medications: I have reviewed the patient's current medications.  Assesment:   Principal Problem:   AKI (acute kidney injury) ([place]) Active Problems:   Hypothyroidism   UTI (urinary tract infection)   Chronic combined systolic and diastolic CHF (congestive heart failure) (HCC)   ARF (acute renal failure) ([place])    Plan:  Medications reviewed Continue IV hydration and IV antibiotics Will do nephrology consult Renal ultra sound.    LOS: 1 day   Gavriel Holzhauer 01/23/2016, 8:21 AM

## 2016-01-23 NOTE — Progress Notes (Signed)
New orders received, ns bolus, stat EKG, stat BMP & Mg, will continue to monitor, HR irregular between afib/aflutter.

## 2016-01-23 NOTE — Clinical Social Work Note (Signed)
Clinical Social Work Assessment  Patient Details  Name: Michaela Tyler MRN: [id number] Date of Birth: 1921/04/23  Date of referral:  01/23/16               Reason for consult:  Other (Comment Required) ([facility])                Permission sought to share information with:  Case Manager, Magazine features editoracility Contact Representative, Family Supports Permission granted to share information::  Yes, Verbal Permission Granted  Name::        Agency::  [facility]  Relationship::     Contact Information:     Housing/Transportation Living arrangements for the past 2 months:  Skilled Building surveyorursing Facility Source of Information:  Patient, Development worker, communityMedical Team, Facility Patient Interpreter Needed:  None Criminal Activity/Legal Involvement Pertinent to Current Situation/Hospitalization:  No - Comment as needed Significant Relationships:  Spouse, Other Family Members Lives with:  Facility Resident Do you feel safe going back to the place where you live?  Yes Need for family participation in patient care:  Yes (Comment) (per request)  Care giving concerns:  No care giving concern noted from family or husband. Patient has been participating in SNF ST rehab and reports patient has increased strength and received excellent care. Plan will be for patient to return to SNF.   Social Worker assessment / plan:  LCSW followed up with family and patient at the bedside. LCSW placed [facility] and family reports she has been doing very well.  Plan remains for [facility] to return [facility] and family is applying for LTC medicaid in the event they need longer care at facility.  Patient alert and oriented.    No needs at this time.  Facility called, aware of patient admitted and requesting PT consult to continue ST rehab authorization through [facility] Medicare. FL2 updated.  Employment status:  Retired Database administratornsurance information:  Managed Medicare PT Recommendations:  Not assessed  at this time (placed PT order) Information / Referral to community resources:  Other (Comment Required) (assisting family with LTC medicaid and information)  Patient/Family's Response to care:  Agreeable to plan.  Patient/Family's Understanding of and Emotional Response to Diagnosis, Current Treatment, and Prognosis:  Discussed current prognosis and treatment of patient. Family very motivated and understanding of current needs and hopeful for return home, but realistic to understand she needs additional care at hospital and SNF.  Family being proactive in applying for LTC medicaid and LCSW will assist as needed.  Emotional Assessment Appearance:  Appears stated age Attitude/Demeanor/Rapport:  Other (quiet, cooperative and pleasant) Affect (typically observed):  Accepting, Adaptable Orientation:  Oriented to Self, Oriented to Place, Oriented to Situation Alcohol / Substance use:  Not Applicable Psych involvement (Current and /or in the community):  No (Comment)  Discharge Needs  Concerns to be addressed:  No discharge needs identified Readmission within the last 30 days:  Yes Current discharge risk:  None Barriers to Discharge:  No Barriers Identified, Continued Medical Work up   Raye SorrowCoble, Deonne Rooks N, LCSW 01/23/2016, 1:14 PM

## 2016-01-23 NOTE — Progress Notes (Signed)
Per CCMD patient AFIB/aflutter HR spiked up 130's 2x, baseline HR 100-110s, BP  92/66 manual,sats 98% on room air, alert to self only, denies any pain, mid level text paged, await response.

## 2016-01-23 NOTE — Consult Note (Addendum)
TASIA LIZ MRN: [id number] DOB/AGE: 05-04-1921 82 y.o. Primary Care Physician:FANTA,TESFAYE, MD Admit date: 01/22/2016 Chief Complaint:  Chief Complaint  Patient presents with  . Abnormal Lab   HPI: Pt is 82 year old feamle admitted with abnormal labs  HPI dates back to past few days when pt has her labs drawn at nursing home . Pt was noticed to have high K and creat and sent to ER. Pt was admitted with  acute renal failure.  Pt remains confused, unable to offer any complaints. Pt just says " yes"  H&P from records and Nursing staff.    Past Medical History  Diagnosis Date  . Hypertension   . High cholesterol   . GERD (gastroesophageal reflux disease)   . Arthritis   . Anxiety   . Depression   . Cataract   . Hearing reduced   . PONV (postoperative nausea and vomiting)   . H/O left breast biopsy     NO CANCER  . Low back pain   . Chronic right hip pain         Family History  Problem Relation Age of Onset  . Colon cancer Neg Hx   . Liver disease Neg Hx     Social History:  reports that she has never smoked. She does not have any smokeless tobacco history on file. She reports that she does not drink alcohol or use illicit drugs.   Allergies: No Known Allergies  Medications Prior to Admission  Medication Sig Dispense Refill  . ALPRAZolam (XANAX) 0.25 MG tablet Take 1 tablet (0.25 mg total) by mouth 2 (two) times daily as needed for anxiety. 20 tablet 0  . aspirin EC 81 MG tablet Take 81 mg by mouth daily.    . citalopram (CELEXA) 10 MG tablet Take 10 mg by mouth at bedtime.   3  . ferrous sulfate 325 (65 FE) MG tablet Take 325 mg by mouth daily with breakfast.    . furosemide (LASIX) 20 MG tablet Take 20 mg by mouth daily.    Marland Kitchen levothyroxine (SYNTHROID, LEVOTHROID) 88 MCG tablet Take 1 tablet (88 mcg total) by mouth daily. 90 tablet 3  . losartan (COZAAR) 50 MG tablet Take 50 mg by mouth daily.    . magnesium oxide (MAG-OX) 400 (241.3 Mg) MG tablet Take 1  tablet (400 mg total) by mouth 2 (two) times daily. 60 tablet 3  . metoprolol (LOPRESSOR) 50 MG tablet Take 1 tablet (50 mg total) by mouth 2 (two) times daily. 60 tablet 3  . omeprazole (PRILOSEC) 20 MG capsule Take 20 mg by mouth daily.  3  . traMADol (ULTRAM) 50 MG tablet Take 1 tablet (50 mg total) by mouth every 12 (twelve) hours as needed. 60 tablet 0       FVC:BSWHQ from the symptoms mentioned above,there are no other symptoms referable to all systems reviewed.  Marland Kitchen aspirin EC  81 mg Oral Daily  . cefTRIAXone (ROCEPHIN)  IV  1 g Intravenous Q24H  . citalopram  10 mg Oral QHS  . heparin subcutaneous  5,000 Units Subcutaneous 3 times per day  . levothyroxine  88 mcg Oral QAC breakfast  . pantoprazole  40 mg Oral Daily  . sodium chloride flush  3 mL Intravenous Q12H        Physical Exam: Vital signs in last 24 hours: Temp:  [97.4 F (36.3 C)-97.6 F (36.4 C)] 97.4 F (36.3 C) (03/29 0500) Pulse Rate:  [104-118] 110 (03/29 0500) Resp:  [  16-20] 18 (03/29 0500) BP: (89-95)/(62-67) 93/65 mmHg (03/29 0500) SpO2:  [93 %-96 %] 94 % (03/29 0500) Weight:  [252 lb (114.306 kg)] 252 lb (114.306 kg) (03/28 1623) Weight change:  Last BM Date: 01/22/16  Intake/Output from previous day: 03/28 0701 - 03/29 0700 In: 2240 [P.O.:240; I.V.:2000] Out: -  Total I/O In: 240 [P.O.:240] Out: -    Physical Exam: General- pt is awake,but confused Resp- No acute REsp distress, decreased bs at bases CVS- S1S2 regular in rate and rhythm GIT- BS+, soft, NT, ND EXT- 2+ LE Edema, NO Cyanosis Skin- Right arm with blood as pt just pulled out her IV  Lab Results: CBC  Recent Labs  01/22/16 1235 01/23/16 0625  WBC 6.4 5.8  HGB 12.9 12.6  HCT 39.7 38.4  PLT 161 148*    BMET  Recent Labs  01/22/16 1235 01/23/16 0625  NA 133* 135  K 5.7* 5.0  CL 101 105  CO2 18* 19*  GLUCOSE 124* 90  BUN >100* 96*  CREATININE 5.32* 4.70*  CALCIUM 8.5* 8.0*    MICRO Recent Results (from  the past 240 hour(s))  Urine culture     Status: None (Preliminary result)   Collection Time: 01/22/16  2:15 PM  Result Value Ref Range Status   Specimen Description URINE, CATHETERIZED  Final   Special Requests NONE  Final   Culture   Final    TOO YOUNG TO READ Performed at [facility]    Report Status PENDING  Incomplete      Lab Results  Component Value Date   CALCIUM 8.0* 01/23/2016      Impression: 1)Renal  AKI secondary to Prerenal/ATN                AKI sec to Hypotension/ARB/Diuretics                AKI on CKD               CKD stage 3.               CKD since 2010               CKD secondary to Cardiorenal/HTN/Multiple AKI/ age associated decline                Progression of CKD marked with AKI                Proteinura will check.   2)CVS- hemodynamically stable  3)Anemia HGb at goal   4)CKD Mineral-Bone Disorder PTH not avail . Secondary Hyperparathyroidism w/u pending  Phosphorus and Vitamin 25-OH will check  5)IOd-admitted with UTI Primary MD following  6)Electrolytes Hyperkalemic      Now betetr Hyponatremic      Now better  7)Acid base Co2 at goal  8) CHF- pt with Systolic CHF               Pulmonary HTn             Renal U/s shows Pleural effusion- will ask for CXR-may need tap ?   Plan:  Agree with holding  Arb Will ask for albumin in am Will restart lasix Will ask for fena Agree with  renal u.s Will ask for spep, esr Will ask for CKd-BMD labs Will ask for spot prot/crea ratio-Pt U/s does not show much proteinuria, pt has edema sec to CHF + Pul HTN      BHUTANI,MANPREET S 01/23/2016, 2:59 PM

## 2016-01-24 LAB — PROTEIN ELECTROPHORESIS, SERUM
A/G Ratio: 0.9 (ref 0.7–1.7)
Albumin ELP: 2.7 g/dL — ABNORMAL LOW (ref 2.9–4.4)
Alpha-1-Globulin: 0.4 g/dL (ref 0.0–0.4)
Alpha-2-Globulin: 0.5 g/dL (ref 0.4–1.0)
Beta Globulin: 0.9 g/dL (ref 0.7–1.3)
Gamma Globulin: 1.2 g/dL (ref 0.4–1.8)
Globulin, Total: 3 g/dL (ref 2.2–3.9)
Total Protein ELP: 5.7 g/dL — ABNORMAL LOW (ref 6.0–8.5)

## 2016-01-24 LAB — COMPREHENSIVE METABOLIC PANEL
ALBUMIN: 3.1 g/dL — AB (ref 3.5–5.0)
ALT: 34 U/L (ref 14–54)
ANION GAP: 14 (ref 5–15)
AST: 30 U/L (ref 15–41)
Alkaline Phosphatase: 149 U/L — ABNORMAL HIGH (ref 38–126)
BILIRUBIN TOTAL: 1.7 mg/dL — AB (ref 0.3–1.2)
BUN: 90 mg/dL — ABNORMAL HIGH (ref 6–20)
CO2: 18 mmol/L — ABNORMAL LOW (ref 22–32)
Calcium: 8.7 mg/dL — ABNORMAL LOW (ref 8.9–10.3)
Chloride: 106 mmol/L (ref 101–111)
Creatinine, Ser: 4.18 mg/dL — ABNORMAL HIGH (ref 0.44–1.00)
GFR, EST AFRICAN AMERICAN: 10 mL/min — AB (ref >60)
GFR, EST NON AFRICAN AMERICAN: 8 mL/min — AB (ref >60)
Glucose, Bld: 115 mg/dL — ABNORMAL HIGH (ref 65–99)
Potassium: 4.8 mmol/L (ref 3.5–5.1)
Sodium: 138 mmol/L (ref 135–145)
TOTAL PROTEIN: 6.1 g/dL — AB (ref 6.5–8.1)

## 2016-01-24 LAB — VITAMIN D 25 HYDROXY (VIT D DEFICIENCY, FRACTURES): Vit D, 25-Hydroxy: 5.9 ng/mL — ABNORMAL LOW (ref 30.0–100.0)

## 2016-01-24 LAB — PHOSPHORUS: Phosphorus: 3.7 mg/dL (ref 2.5–4.6)

## 2016-01-24 MED ORDER — SODIUM CHLORIDE 0.9 % IV SOLN
INTRAVENOUS | Status: DC
  Administered 2016-01-24 – 2016-01-26 (×2): via INTRAVENOUS

## 2016-01-24 NOTE — Progress Notes (Signed)
Subjective: Patient remained confused. She is on IV fluid and IV antibiotics. Patient was seen by nephrology and was restarted on lasix. Her renal function has not improved much. Objective: Vital signs in last 24 hours: Temp:  [97.8 F (36.6 C)-98 F (36.7 C)] 98 F (36.7 C) (03/30 0500) Pulse Rate:  [109-112] 110 (03/30 0500) Resp:  [18] 18 (03/30 0500) BP: (94-96)/(61-63) 94/62 mmHg (03/30 0500) SpO2:  [93 %-94 %] 93 % (03/30 0500) Weight change:  Last BM Date: 01/22/16  Intake/Output from previous day: 03/29 0701 - 03/30 0700 In: 840 [P.O.:840] Out: -   PHYSICAL EXAM General appearance: no distress, moderately obese and slowed mentation Resp: clear to auscultation bilaterally Cardio: S1, S2 normal GI: soft, non-tender; bowel sounds normal; no masses,  no organomegaly Extremities: extremities normal, atraumatic, no cyanosis or edema  Lab Results:  Results for orders placed or performed during the hospital encounter of 01/22/16 (from the past 48 hour(s))  CBC with Differential/Platelet     Status: Abnormal   Collection Time: 01/22/16 12:35 PM  Result Value Ref Range   WBC 6.4 4.0 - 10.5 K/uL   RBC 4.19 3.87 - 5.11 MIL/uL   Hemoglobin 12.9 12.0 - 15.0 g/dL   HCT 39.7 36.0 - 46.0 %   MCV 94.7 78.0 - 100.0 fL   MCH 30.8 26.0 - 34.0 pg   MCHC 32.5 30.0 - 36.0 g/dL   RDW 18.3 (H) 11.5 - 15.5 %   Platelets 161 150 - 400 K/uL   Neutrophils Relative % 66 %   Neutro Abs 4.1 1.7 - 7.7 K/uL   Lymphocytes Relative 18 %   Lymphs Abs 1.2 0.7 - 4.0 K/uL   Monocytes Relative 14 %   Monocytes Absolute 0.9 0.1 - 1.0 K/uL   Eosinophils Relative 2 %   Eosinophils Absolute 0.2 0.0 - 0.7 K/uL   Basophils Relative 0 %   Basophils Absolute 0.0 0.0 - 0.1 K/uL  Comprehensive metabolic panel     Status: Abnormal   Collection Time: 01/22/16 12:35 PM  Result Value Ref Range   Sodium 133 (L) 135 - 145 mmol/L   Potassium 5.7 (H) 3.5 - 5.1 mmol/L   Chloride 101 101 - 111 mmol/L   CO2 18  (L) 22 - 32 mmol/L   Glucose, Bld 124 (H) 65 - 99 mg/dL   BUN >100 (H) 6 - 20 mg/dL   Creatinine, Ser 5.32 (H) 0.44 - 1.00 mg/dL   Calcium 8.5 (L) 8.9 - 10.3 mg/dL   Total Protein 6.0 (L) 6.5 - 8.1 g/dL   Albumin 3.0 (L) 3.5 - 5.0 g/dL   AST 34 15 - 41 U/L   ALT 33 14 - 54 U/L   Alkaline Phosphatase 154 (H) 38 - 126 U/L   Total Bilirubin 1.6 (H) 0.3 - 1.2 mg/dL   GFR calc non Af Amer 6 (L) >60 mL/min   GFR calc Af Amer 7 (L) >60 mL/min    Comment: (NOTE) The eGFR has been calculated using the CKD EPI equation. This calculation has not been validated in all clinical situations. eGFR's persistently <60 mL/min signify possible Chronic Kidney Disease.    Anion gap 14 5 - 15  Lactic acid, plasma     Status: Abnormal   Collection Time: 01/22/16  1:28 PM  Result Value Ref Range   Lactic Acid, Venous 2.9 (HH) 0.5 - 2.0 mmol/L    Comment: CRITICAL RESULT CALLED TO, READ BACK BY AND VERIFIED WITH: OSBOURNE,T AT 1430  BU HUFFINES,S ON 01/22/16.   Urinalysis, Routine w reflex microscopic (not at [facility])     Status: Abnormal   Collection Time: 01/22/16  2:15 PM  Result Value Ref Range   Color, Urine YELLOW YELLOW   APPearance CLOUDY (A) CLEAR   Specific Gravity, Urine 1.025 1.005 - 1.030   pH 6.0 5.0 - 8.0   Glucose, UA NEGATIVE NEGATIVE mg/dL   Hgb urine dipstick MODERATE (A) NEGATIVE   Bilirubin Urine NEGATIVE NEGATIVE   Ketones, ur NEGATIVE NEGATIVE mg/dL   Protein, ur 30 (A) NEGATIVE mg/dL   Nitrite POSITIVE (A) NEGATIVE   Leukocytes, UA LARGE (A) NEGATIVE  Urine culture     Status: None (Preliminary result)   Collection Time: 01/22/16  2:15 PM  Result Value Ref Range   Specimen Description URINE, CATHETERIZED    Special Requests NONE    Culture      TOO YOUNG TO READ Performed at [facility]    Report Status PENDING   Urine microscopic-add on     Status: Abnormal   Collection Time: 01/22/16  2:15 PM  Result Value Ref Range   Squamous Epithelial / LPF 0-5 (A) NONE  SEEN   WBC, UA TOO NUMEROUS TO COUNT 0 - 5 WBC/hpf   RBC / HPF 6-30 0 - 5 RBC/hpf   Bacteria, UA MANY (A) NONE SEEN  CBC     Status: Abnormal   Collection Time: 01/23/16  6:25 AM  Result Value Ref Range   WBC 5.8 4.0 - 10.5 K/uL   RBC 4.06 3.87 - 5.11 MIL/uL   Hemoglobin 12.6 12.0 - 15.0 g/dL   HCT 38.4 36.0 - 46.0 %   MCV 94.6 78.0 - 100.0 fL   MCH 31.0 26.0 - 34.0 pg   MCHC 32.8 30.0 - 36.0 g/dL   RDW 18.2 (H) 11.5 - 15.5 %   Platelets 148 (L) 150 - 400 K/uL  Basic metabolic panel     Status: Abnormal   Collection Time: 01/23/16  6:25 AM  Result Value Ref Range   Sodium 135 135 - 145 mmol/L   Potassium 5.0 3.5 - 5.1 mmol/L   Chloride 105 101 - 111 mmol/L   CO2 19 (L) 22 - 32 mmol/L   Glucose, Bld 90 65 - 99 mg/dL   BUN 96 (H) 6 - 20 mg/dL   Creatinine, Ser 4.70 (H) 0.44 - 1.00 mg/dL   Calcium 8.0 (L) 8.9 - 10.3 mg/dL   GFR calc non Af Amer 7 (L) >60 mL/min   GFR calc Af Amer 8 (L) >60 mL/min    Comment: (NOTE) The eGFR has been calculated using the CKD EPI equation. This calculation has not been validated in all clinical situations. eGFR's persistently <60 mL/min signify possible Chronic Kidney Disease.    Anion gap 11 5 - 15  Magnesium     Status: Abnormal   Collection Time: 01/23/16  6:25 AM  Result Value Ref Range   Magnesium 2.8 (H) 1.7 - 2.4 mg/dL  VITAMIN D 25 Hydroxy (Vit-D Deficiency, Fractures)     Status: Abnormal   Collection Time: 01/23/16  4:36 PM  Result Value Ref Range   Vit D, 25-Hydroxy 5.9 (L) 30.0 - 100.0 ng/mL    Comment: (NOTE) Vitamin D deficiency has been defined by the Institute of Medicine and an Endocrine Society practice guideline as a level of serum 25-OH vitamin D less than 20 ng/mL (1,2). The Endocrine Society went on to further define vitamin D  insufficiency as a level between 21 and 29 ng/mL (2). 1. IOM (Institute of Medicine). 2010. Dietary reference   intakes for calcium and D. [place]: The   [employer]. 2.  Holick MF, Binkley Hassell, Bischoff-Ferrari HA, et al.   Evaluation, treatment, and prevention of vitamin D   deficiency: an Endocrine Society clinical practice   guideline. JCEM. 2011 Jul; 96(7):1911-30. Performed At: [facility] [place], [place] [id number] Lindon Romp MD [id number]   Sedimentation rate     Status: None   Collection Time: 01/23/16  4:36 PM  Result Value Ref Range   Sed Rate 5 0 - 22 mm/hr  Comprehensive metabolic panel     Status: Abnormal   Collection Time: 01/24/16  6:43 AM  Result Value Ref Range   Sodium 138 135 - 145 mmol/L   Potassium 4.8 3.5 - 5.1 mmol/L   Chloride 106 101 - 111 mmol/L   CO2 18 (L) 22 - 32 mmol/L   Glucose, Bld 115 (H) 65 - 99 mg/dL   BUN 90 (H) 6 - 20 mg/dL   Creatinine, Ser 4.18 (H) 0.44 - 1.00 mg/dL   Calcium 8.7 (L) 8.9 - 10.3 mg/dL   Total Protein 6.1 (L) 6.5 - 8.1 g/dL   Albumin 3.1 (L) 3.5 - 5.0 g/dL   AST 30 15 - 41 U/L   ALT 34 14 - 54 U/L   Alkaline Phosphatase 149 (H) 38 - 126 U/L   Total Bilirubin 1.7 (H) 0.3 - 1.2 mg/dL   GFR calc non Af Amer 8 (L) >60 mL/min   GFR calc Af Amer 10 (L) >60 mL/min    Comment: (NOTE) The eGFR has been calculated using the CKD EPI equation. This calculation has not been validated in all clinical situations. eGFR's persistently <60 mL/min signify possible Chronic Kidney Disease.    Anion gap 14 5 - 15  Phosphorus     Status: None   Collection Time: 01/24/16  6:43 AM  Result Value Ref Range   Phosphorus 3.7 2.5 - 4.6 mg/dL    ABGS No results for input(s): PHART, PO2ART, TCO2, HCO3 in the last 72 hours.  Invalid input(s): PCO2 CULTURES Recent Results (from the past 240 hour(s))  Urine culture     Status: None (Preliminary result)   Collection Time: 01/22/16  2:15 PM  Result Value Ref Range Status   Specimen Description URINE, CATHETERIZED  Final   Special Requests NONE  Final   Culture   Final    TOO YOUNG TO READ Performed at [facility]     Report Status PENDING  Incomplete   Studies/Results: Dg Chest 1 View  01/23/2016  CLINICAL DATA:  CHF EXAM: CHEST 1 VIEW COMPARISON:  01/01/2016 FINDINGS: Cardiac shadow is enlarged. Mild vascular congestion is noted. No focal pulmonary edema is seen. No focal infiltrate is noted. No bony abnormality is seen. IMPRESSION: Vascular congestion without significant pulmonary edema. Electronically Signed   By: Inez Catalina M.D.   On: 01/23/2016 16:34   US Renal  01/23/2016  CLINICAL DATA:  Acute renal failure EXAM: RENAL / URINARY TRACT ULTRASOUND COMPLETE COMPARISON:  01/04/2016 FINDINGS: Right Kidney: Length: 9.7 cm. A parapelvic cyst is noted in the upper pole measuring approximately 1.4 cm in greatest dimension. Left Kidney: Length: 10 cm. A few scattered cysts are noted new. The largest of these measures 1.1 cm in greatest dimension. Bladder: Appears normal for degree of bladder distention. Incidental note is made  of a left-sided pleural effusion. IMPRESSION: Bilateral renal cystic change. Electronically Signed   By: Inez Catalina M.D.   On: 01/23/2016 11:08    Medications: I have reviewed the patient's current medications.  Assesment:   Principal Problem:   AKI (acute kidney injury) ([place]) Active Problems:   Hypothyroidism   UTI (urinary tract infection)   Chronic combined systolic and diastolic CHF (congestive heart failure) ([facility])   ARF (acute renal failure) ([place])    Plan:  Medications reviewed Continue IV hydration and IV antibiotics Nephrology consult appreciated Will monitor BMP.    LOS: 2 days   Oriya Kettering 01/24/2016, 7:35 AM

## 2016-01-24 NOTE — Progress Notes (Signed)
Subjective: Patient presently denies any nausea or vomiting. Denies also difficulty breathing. She looks somewhat somnolent.   Objective: Vital signs in last 24 hours: Temp:  [97.8 F (36.6 C)-98 F (36.7 C)] 98 F (36.7 C) (03/30 0500) Pulse Rate:  [109-112] 110 (03/30 0500) Resp:  [18] 18 (03/30 0500) BP: (94-96)/(61-63) 94/62 mmHg (03/30 0500) SpO2:  [93 %-94 %] 93 % (03/30 0500)  Intake/Output from previous day: 03/29 0701 - 03/30 0700 In: 840 [P.O.:840] Out: -  Intake/Output this shift:     Recent Labs  01/22/16 1235 01/23/16 0625  HGB 12.9 12.6    Recent Labs  01/22/16 1235 01/23/16 0625  WBC 6.4 5.8  RBC 4.19 4.06  HCT 39.7 38.4  PLT 161 148*    Recent Labs  01/23/16 0625 01/24/16 0643  NA 135 138  K 5.0 4.8  CL 105 106  CO2 19* 18*  BUN 96* 90*  CREATININE 4.70* 4.18*  GLUCOSE 90 115*  CALCIUM 8.0* 8.7*   No results for input(s): LABPT, INR in the last 72 hours.  Generally patient sleepy but arousable and answers questions. Chest is clear to auscultation Heart exam regular rate and rhythm Extremities no edema Assessment/Plan: Problem #1 acute kidney injury superimposed on chronic. Presently her BUN and creatinine is slightly better. Patient does not seem to have any uremic sinus symptoms. Problem #2 hyperkalemia: Potassium has corrected Problem #3 hypertension: Her blood pressure seems to be low normal Problem #4 history of UTI Problem #5 chronic renal failure: Baseline creatinine about 1.9 stage III. Problem #6 anemia: Her hemoglobin is stable. Problem #7 low CO2 possibly metabolic acidosis Plan: We'll start patient on slow hydration We'll check her basic metabolic panel in the morning   Fredia Chittenden S 01/24/2016, 8:27 AM

## 2016-01-24 NOTE — Consult Note (Signed)
   [facility]   01/24/2016  Michaela Tyler July 06, 1921 [id number]  Patient screened for potential [facility] Care Management services. Patient is eligible for [facility] Services. Epic reveals patient's discharge plan is back to Skilled nursing care and then possible Long Term Care, no identifiable needs at this time. [facility] not appropriate at this time.  If patient's post hospital needs change please place a [facility].  For questions please contact:   Alben SpittleMary E. Albertha GheeNiemczura, RN, BSN, [facility]   Hospital Liaison [facility] [phone]) Business Cell  [phone] Toll Free Office

## 2016-01-24 NOTE — Progress Notes (Signed)
PT Cancellation Note  Patient Details Name: Michaela LeakGarnett M Cromley MRN: [id number] DOB: 1921-01-05   Cancelled Treatment:    Reason Eval/Treat Not Completed: Patient's level of consciousness. Chart reviewed, RN consulted. Treatment attempted but unsuccessful due to patients AMS. I engaged patient to participate in BUE MMT and assessment of grip strength, requiring heavy verbal cues with limited response. Pt is not following simple commands enough to participate at this moment in time. The patient is familiar to this PT from prior admissions. I will try again at later date/time.   10:39 AM, 01/24/2016 Rosamaria LintsAllan C Munachimso Palin, PT, DPT PRN [facility] License # [id number] [phone] (wireless)  [phone] (mobile)

## 2016-01-25 LAB — COMPREHENSIVE METABOLIC PANEL
ALT: 29 U/L (ref 14–54)
AST: 26 U/L (ref 15–41)
Albumin: 2.9 g/dL — ABNORMAL LOW (ref 3.5–5.0)
Alkaline Phosphatase: 134 U/L — ABNORMAL HIGH (ref 38–126)
Anion gap: 13 (ref 5–15)
BUN: 83 mg/dL — AB (ref 6–20)
CHLORIDE: 106 mmol/L (ref 101–111)
CO2: 20 mmol/L — AB (ref 22–32)
CREATININE: 3.58 mg/dL — AB (ref 0.44–1.00)
Calcium: 8.6 mg/dL — ABNORMAL LOW (ref 8.9–10.3)
GFR calc Af Amer: 12 mL/min — ABNORMAL LOW (ref >60)
GFR calc non Af Amer: 10 mL/min — ABNORMAL LOW (ref >60)
Glucose, Bld: 102 mg/dL — ABNORMAL HIGH (ref 65–99)
POTASSIUM: 4.4 mmol/L (ref 3.5–5.1)
SODIUM: 139 mmol/L (ref 135–145)
Total Bilirubin: 1.7 mg/dL — ABNORMAL HIGH (ref 0.3–1.2)
Total Protein: 5.8 g/dL — ABNORMAL LOW (ref 6.5–8.1)

## 2016-01-25 LAB — URINE CULTURE

## 2016-01-25 LAB — PTH, INTACT AND CALCIUM
Calcium, Total (PTH): 8.8 mg/dL (ref 8.7–10.3)
PTH: 85 pg/mL — ABNORMAL HIGH (ref 15–65)

## 2016-01-25 LAB — GLUCOSE, CAPILLARY: Glucose-Capillary: 145 mg/dL — ABNORMAL HIGH (ref 65–99)

## 2016-01-25 MED ORDER — FUROSEMIDE 80 MG PO TABS
80.0000 mg | ORAL_TABLET | Freq: Two times a day (BID) | ORAL | Status: DC
  Administered 2016-01-25 – 2016-01-27 (×5): 80 mg via ORAL
  Filled 2016-01-25 (×6): qty 1

## 2016-01-25 NOTE — Progress Notes (Signed)
PT Cancellation Note  Patient Details Name: Michaela Tyler MRN: [id number] DOB: July 17, 1921   Cancelled Treatment:     Chart reviewed, Evaluation attempted. Pt is now tachycardic with HR in 130's. Pt remains confused, intermittently conversational, but does not respond to complex questions. It is difficult to determine whether this decline in cognition is acute or part of a progressive decline, however, pt is inappropriate for PT at this time. I will cancel the order. Should the patient presentation improve in lucency and stability of vital signs, I will be glad to evaluate this patient after a new order has been placed.   9:49 AM, 01/25/2016 Rosamaria LintsAllan C Rai Sinagra, PT, DPT PRN [facility] License # [id number] [phone] (wireless)  [phone] (mobile)

## 2016-01-25 NOTE — Progress Notes (Signed)
Subjective: Patient offers no complaints. She denies any difficulty in breathing.  Objective: Vital signs in last 24 hours: Temp:  [97.6 F (36.4 C)] 97.6 F (36.4 C) (03/31 0428) Pulse Rate:  [95-122] 95 (03/31 0428) Resp:  [20-21] 21 (03/31 0428) BP: (100-117)/(60-86) 100/60 mmHg (03/31 0428) SpO2:  [95 %-99 %] 99 % (03/31 0428)  Intake/Output from previous day: 03/30 0701 - 03/31 0700 In: 120 [P.O.:120] Out: -  Intake/Output this shift:     Recent Labs  01/22/16 1235 01/23/16 0625  HGB 12.9 12.6    Recent Labs  01/22/16 1235 01/23/16 0625  WBC 6.4 5.8  RBC 4.19 4.06  HCT 39.7 38.4  PLT 161 148*    Recent Labs  01/24/16 0643 01/25/16 0616  NA 138 139  K 4.8 4.4  CL 106 106  CO2 18* 20*  BUN 90* 83*  CREATININE 4.18* 3.58*  GLUCOSE 115* 102*  CALCIUM 8.7*  8.8 8.6*   No results for input(s): LABPT, INR in the last 72 hours.  Generally patient sleepy but arousable and answers questions. Chest is clear to auscultation Heart exam regular rate and rhythm Extremities 1+ edema Assessment/Plan: Problem #1 acute kidney injury superimposed on chronic. Her BUN and creatinine is improving. Patient however seems to be retaining more fluid. Problem #2 hyperkalemia: Potassium has corrected Problem #3 hypertension: Her blood pressure seems his reasonably controlled. Problem #4 history of UTI: The patient is a febrile and her white blood cell count is normal. Problem #5 chronic renal failure: Baseline creatinine about 1.9 stage III. Problem #6 anemia: Her hemoglobin is stable. Problem #7 low CO2 possibly metabolic acidosis: Progressively improving. Plan: 1] We'll decrease IV fluid to 50 mL per hour 2] we'll increase Lasix to 80 mg IV twice a day 3] We'll check her basic metabolic panel in the morning   [facility] LLC[facility]FEKADU,Soo Steelman S 01/25/2016, 9:32 AM

## 2016-01-25 NOTE — Progress Notes (Signed)
Subjective: Patient is resing. She is alert and awake and resting. No new complaint. She is growing E.Coli in her urine culture which is sensitive to the current antibiotics. Her renal function is slightly improving. Objective: Vital signs in last 24 hours: Temp:  [97.6 F (36.4 C)] 97.6 F (36.4 C) (03/31 0428) Pulse Rate:  [95-122] 95 (03/31 0428) Resp:  [20-21] 21 (03/31 0428) BP: (100-117)/(60-86) 100/60 mmHg (03/31 0428) SpO2:  [95 %-99 %] 99 % (03/31 0428) Weight change:  Last BM Date: 01/24/16  Intake/Output from previous day: 03/30 0701 - 03/31 0700 In: 120 [P.O.:120] Out: -   PHYSICAL EXAM General appearance: no distress, moderately obese and slowed mentation Resp: clear to auscultation bilaterally Cardio: S1, S2 normal GI: soft, non-tender; bowel sounds normal; no masses,  no organomegaly Extremities: edema 2++ leg edema  Lab Results:  Results for orders placed or performed during the hospital encounter of 01/22/16 (from the past 48 hour(s))  VITAMIN D 25 Hydroxy (Vit-D Deficiency, Fractures)     Status: Abnormal   Collection Time: 01/23/16  4:36 PM  Result Value Ref Range   Vit D, 25-Hydroxy 5.9 (L) 30.0 - 100.0 ng/mL    Comment: (NOTE) Vitamin D deficiency has been defined by the [place] and an Endocrine Society practice guideline as a level of serum 25-OH vitamin D less than 20 ng/mL (1,2). The Endocrine Society went on to further define vitamin D insufficiency as a level between 21 and 29 ng/mL (2). 1. IOM (Institute of Medicine). 2010. Dietary reference   intakes for calcium and D. [place]: The   [employer]. 2. Holick MF, Binkley [place], Bischoff-Ferrari HA, et al.   Evaluation, treatment, and prevention of vitamin D   deficiency: an Endocrine Society clinical practice   guideline. JCEM. 2011 Jul; 96(7):1911-30. Performed At: [facility] [place], [place] [id number] Lindon Romp MD [id number]    Protein electrophoresis, serum     Status: Abnormal   Collection Time: 01/23/16  4:36 PM  Result Value Ref Range   Total Protein ELP 5.7 (L) 6.0 - 8.5 g/dL   Albumin ELP 2.7 (L) 2.9 - 4.4 g/dL   Alpha-1-Globulin 0.4 0.0 - 0.4 g/dL   Alpha-2-Globulin 0.5 0.4 - 1.0 g/dL   Beta Globulin 0.9 0.7 - 1.3 g/dL   Gamma Globulin 1.2 0.4 - 1.8 g/dL   M-Spike, % Not Observed Not Observed g/dL   SPE Interp. Comment     Comment: (NOTE) The SPE pattern reflects hypoalbuminemia. Evidence of monoclonal protein is not apparent. Performed At: [facility] [place], [place] [id number] Lindon Romp MD ZD:[id number]    Comment Comment     Comment: (NOTE) Protein electrophoresis scan will follow via computer, mail, or courier delivery.    GLOBULIN, TOTAL 3.0 2.2 - 3.9 g/dL   A/G Ratio 0.9 0.7 - 1.7  Sedimentation rate     Status: None   Collection Time: 01/23/16  4:36 PM  Result Value Ref Range   Sed Rate 5 0 - 22 mm/hr  Comprehensive metabolic panel     Status: Abnormal   Collection Time: 01/24/16  6:43 AM  Result Value Ref Range   Sodium 138 135 - 145 mmol/L   Potassium 4.8 3.5 - 5.1 mmol/L   Chloride 106 101 - 111 mmol/L   CO2 18 (L) 22 - 32 mmol/L   Glucose, Bld 115 (H) 65 - 99 mg/dL   BUN 90 (H) 6 -  20 mg/dL   Creatinine, Ser 4.18 (H) 0.44 - 1.00 mg/dL   Calcium 8.7 (L) 8.9 - 10.3 mg/dL   Total Protein 6.1 (L) 6.5 - 8.1 g/dL   Albumin 3.1 (L) 3.5 - 5.0 g/dL   AST 30 15 - 41 U/L   ALT 34 14 - 54 U/L   Alkaline Phosphatase 149 (H) 38 - 126 U/L   Total Bilirubin 1.7 (H) 0.3 - 1.2 mg/dL   GFR calc non Af Amer 8 (L) >60 mL/min   GFR calc Af Amer 10 (L) >60 mL/min    Comment: (NOTE) The eGFR has been calculated using the CKD EPI equation. This calculation has not been validated in all clinical situations. eGFR's persistently <60 mL/min signify possible Chronic Kidney Disease.    Anion gap 14 5 - 15  Phosphorus     Status: None   Collection Time: 01/24/16   6:43 AM  Result Value Ref Range   Phosphorus 3.7 2.5 - 4.6 mg/dL  PTH, intact and calcium     Status: Abnormal   Collection Time: 01/24/16  6:43 AM  Result Value Ref Range   PTH 85 (H) 15 - 65 pg/mL   Calcium, Total (PTH) 8.8 8.7 - 10.3 mg/dL   PTH Comment     Comment: (NOTE) Interpretation                 Intact PTH    Calcium                                (pg/mL)      (mg/dL) Normal                          15 - 65     8.6 - 10.2 Primary Hyperparathyroidism         >65          >10.2 Secondary Hyperparathyroidism       >65          <10.2 Non-Parathyroid Hypercalcemia       <65          >10.2 Hypoparathyroidism                  <15          < 8.6 Non-Parathyroid Hypocalcemia    15 - 65          < 8.6 Performed At: [facility] [place], [place] [id number] Lindon Romp MD SN:[id number]   Comprehensive metabolic panel     Status: Abnormal   Collection Time: 01/25/16  6:16 AM  Result Value Ref Range   Sodium 139 135 - 145 mmol/L   Potassium 4.4 3.5 - 5.1 mmol/L   Chloride 106 101 - 111 mmol/L   CO2 20 (L) 22 - 32 mmol/L   Glucose, Bld 102 (H) 65 - 99 mg/dL   BUN 83 (H) 6 - 20 mg/dL   Creatinine, Ser 3.58 (H) 0.44 - 1.00 mg/dL   Calcium 8.6 (L) 8.9 - 10.3 mg/dL   Total Protein 5.8 (L) 6.5 - 8.1 g/dL   Albumin 2.9 (L) 3.5 - 5.0 g/dL   AST 26 15 - 41 U/L   ALT 29 14 - 54 U/L   Alkaline Phosphatase 134 (H) 38 - 126 U/L   Total Bilirubin 1.7 (H) 0.3 - 1.2 mg/dL   GFR calc non Af  Amer 10 (L) >60 mL/min   GFR calc Af Amer 12 (L) >60 mL/min    Comment: (NOTE) The eGFR has been calculated using the CKD EPI equation. This calculation has not been validated in all clinical situations. eGFR's persistently <60 mL/min signify possible Chronic Kidney Disease.    Anion gap 13 5 - 15  Glucose, capillary     Status: Abnormal   Collection Time: 01/25/16  9:15 AM  Result Value Ref Range   Glucose-Capillary 145 (H) 65 - 99 mg/dL    ABGS No results for  input(s): PHART, PO2ART, TCO2, HCO3 in the last 72 hours.  Invalid input(s): PCO2 CULTURES Recent Results (from the past 240 hour(s))  Urine culture     Status: None   Collection Time: 01/22/16  2:15 PM  Result Value Ref Range Status   Specimen Description URINE, CATHETERIZED  Final   Special Requests NONE  Final   Culture   Final    >=100,000 COLONIES/mL ESCHERICHIA COLI Performed at [facility]    Report Status 01/25/2016 FINAL  Final   Organism ID, Bacteria ESCHERICHIA COLI  Final      Susceptibility   Escherichia coli - MIC*    AMPICILLIN >=32 RESISTANT Resistant     CEFAZOLIN <=4 SENSITIVE Sensitive     CEFTRIAXONE <=1 SENSITIVE Sensitive     CIPROFLOXACIN >=4 RESISTANT Resistant     GENTAMICIN >=16 RESISTANT Resistant     IMIPENEM <=0.25 SENSITIVE Sensitive     NITROFURANTOIN <=16 SENSITIVE Sensitive     TRIMETH/SULFA <=20 SENSITIVE Sensitive     AMPICILLIN/SULBACTAM 16 INTERMEDIATE Intermediate     PIP/TAZO <=4 SENSITIVE Sensitive     * >=100,000 COLONIES/mL ESCHERICHIA COLI   Studies/Results: Dg Chest 1 View  01/23/2016  CLINICAL DATA:  CHF EXAM: CHEST 1 VIEW COMPARISON:  01/01/2016 FINDINGS: Cardiac shadow is enlarged. Mild vascular congestion is noted. No focal pulmonary edema is seen. No focal infiltrate is noted. No bony abnormality is seen. IMPRESSION: Vascular congestion without significant pulmonary edema. Electronically Signed   By: Inez Catalina M.D.   On: 01/23/2016 16:34    Medications: I have reviewed the patient's current medications.  Assesment:   Principal Problem:   AKI (acute kidney injury) ([place]) Active Problems:   Hypothyroidism   UTI (urinary tract infection)   Chronic combined systolic and diastolic CHF (congestive heart failure) (HCC)   ARF (acute renal failure) ([place])    Plan:  Medications reviewed Continue I V hydration and IV antibiotic Will monitor BMP. Continue regular treatment    LOS: 3 days   Quindell Shere 01/25/2016,  11:42 AM

## 2016-01-25 NOTE — Care Management Important Message (Signed)
Important Message  Patient Details  Name: Michaela Tyler MRN: [id number] Date of Birth: 08/07/1921   Medicare Important Message Given:  Yes    Adonis HugueninBerkhead, Ronisha Herringshaw L, RN 01/25/2016, 8:48 AM

## 2016-01-26 LAB — COMPREHENSIVE METABOLIC PANEL
ALT: 28 U/L (ref 14–54)
AST: 25 U/L (ref 15–41)
Albumin: 2.9 g/dL — ABNORMAL LOW (ref 3.5–5.0)
Alkaline Phosphatase: 138 U/L — ABNORMAL HIGH (ref 38–126)
Anion gap: 12 (ref 5–15)
BILIRUBIN TOTAL: 1.6 mg/dL — AB (ref 0.3–1.2)
BUN: 69 mg/dL — AB (ref 6–20)
CALCIUM: 8.6 mg/dL — AB (ref 8.9–10.3)
CO2: 20 mmol/L — ABNORMAL LOW (ref 22–32)
Chloride: 107 mmol/L (ref 101–111)
Creatinine, Ser: 3.06 mg/dL — ABNORMAL HIGH (ref 0.44–1.00)
GFR, EST AFRICAN AMERICAN: 14 mL/min — AB (ref >60)
GFR, EST NON AFRICAN AMERICAN: 12 mL/min — AB (ref >60)
GLUCOSE: 107 mg/dL — AB (ref 65–99)
POTASSIUM: 4.1 mmol/L (ref 3.5–5.1)
Sodium: 139 mmol/L (ref 135–145)
TOTAL PROTEIN: 5.9 g/dL — AB (ref 6.5–8.1)

## 2016-01-26 LAB — CREATININE, URINE, RANDOM: Creatinine, Urine: 25.22 mg/dL

## 2016-01-26 LAB — PHOSPHORUS: Phosphorus: 3.4 mg/dL (ref 2.5–4.6)

## 2016-01-26 LAB — SODIUM, URINE, RANDOM: Sodium, Ur: 114 mmol/L

## 2016-01-26 LAB — PROTEIN, URINE, RANDOM: Total Protein, Urine: 4 mg/dL

## 2016-01-26 MED ORDER — ALPRAZOLAM 0.25 MG PO TABS
0.2500 mg | ORAL_TABLET | Freq: Two times a day (BID) | ORAL | Status: DC | PRN
  Administered 2016-01-26 – 2016-01-27 (×2): 0.25 mg via ORAL
  Filled 2016-01-26 (×2): qty 1

## 2016-01-26 NOTE — Progress Notes (Signed)
Subjective: Patient is resting. She feels better. She is diuresing well. Her lab result is pending. Objective: Vital signs in last 24 hours: Temp:  [97.6 F (36.4 C)] 97.6 F (36.4 C) (03/31 1733) Pulse Rate:  [103] 103 (03/31 1733) Resp:  [20] 20 (03/31 1733) BP: (99)/(62) 99/62 mmHg (03/31 1733) SpO2:  [91 %-96 %] 91 % (03/31 2146) Weight change:  Last BM Date: 01/24/16  Intake/Output from previous day: 03/31 0701 - 04/01 0700 In: 720 [P.O.:720] Out: 2550 [Urine:2550]  PHYSICAL EXAM General appearance: no distress, moderately obese and slowed mentation Resp: clear to auscultation bilaterally Cardio: S1, S2 normal GI: soft, non-tender; bowel sounds normal; no masses,  no organomegaly Extremities: edema 2++ leg edema  Lab Results:  Results for orders placed or performed during the hospital encounter of 01/22/16 (from the past 48 hour(s))  Comprehensive metabolic panel     Status: Abnormal   Collection Time: 01/25/16  6:16 AM  Result Value Ref Range   Sodium 139 135 - 145 mmol/L   Potassium 4.4 3.5 - 5.1 mmol/L   Chloride 106 101 - 111 mmol/L   CO2 20 (L) 22 - 32 mmol/L   Glucose, Bld 102 (H) 65 - 99 mg/dL   BUN 83 (H) 6 - 20 mg/dL   Creatinine, Ser 3.58 (H) 0.44 - 1.00 mg/dL   Calcium 8.6 (L) 8.9 - 10.3 mg/dL   Total Protein 5.8 (L) 6.5 - 8.1 g/dL   Albumin 2.9 (L) 3.5 - 5.0 g/dL   AST 26 15 - 41 U/L   ALT 29 14 - 54 U/L   Alkaline Phosphatase 134 (H) 38 - 126 U/L   Total Bilirubin 1.7 (H) 0.3 - 1.2 mg/dL   GFR calc non Af Amer 10 (L) >60 mL/min   GFR calc Af Amer 12 (L) >60 mL/min    Comment: (NOTE) The eGFR has been calculated using the CKD EPI equation. This calculation has not been validated in all clinical situations. eGFR's persistently <60 mL/min signify possible Chronic Kidney Disease.    Anion gap 13 5 - 15  Glucose, capillary     Status: Abnormal   Collection Time: 01/25/16  9:15 AM  Result Value Ref Range   Glucose-Capillary 145 (H) 65 - 99 mg/dL     ABGS No results for input(s): PHART, PO2ART, TCO2, HCO3 in the last 72 hours.  Invalid input(s): PCO2 CULTURES Recent Results (from the past 240 hour(s))  Urine culture     Status: None   Collection Time: 01/22/16  2:15 PM  Result Value Ref Range Status   Specimen Description URINE, CATHETERIZED  Final   Special Requests NONE  Final   Culture   Final    >=100,000 COLONIES/mL ESCHERICHIA COLI Performed at [facility]    Report Status 01/25/2016 FINAL  Final   Organism ID, Bacteria ESCHERICHIA COLI  Final      Susceptibility   Escherichia coli - MIC*    AMPICILLIN >=32 RESISTANT Resistant     CEFAZOLIN <=4 SENSITIVE Sensitive     CEFTRIAXONE <=1 SENSITIVE Sensitive     CIPROFLOXACIN >=4 RESISTANT Resistant     GENTAMICIN >=16 RESISTANT Resistant     IMIPENEM <=0.25 SENSITIVE Sensitive     NITROFURANTOIN <=16 SENSITIVE Sensitive     TRIMETH/SULFA <=20 SENSITIVE Sensitive     AMPICILLIN/SULBACTAM 16 INTERMEDIATE Intermediate     PIP/TAZO <=4 SENSITIVE Sensitive     * >=100,000 COLONIES/mL ESCHERICHIA COLI   Studies/Results: No results found.  Medications:  I have reviewed the patient's current medications.  Assesment:   Principal Problem:   AKI (acute kidney injury) ([place]) Active Problems:   Hypothyroidism   UTI (urinary tract infection)   Chronic combined systolic and diastolic CHF (congestive heart failure) (HCC)   ARF (acute renal failure) ([place])    Plan:  Medications reviewed Continue I V hydration and IV antibiotic Will monitor BMP. Will continue current treatment as per nephrology recommendation    LOS: 4 days   Fatuma Dowers 01/26/2016, 8:21 AM

## 2016-01-26 NOTE — Progress Notes (Signed)
Subjective: Patient presently denies any nausea or vomiting. She complains of lack of sleep has otherwise no other complaints. Overall her appetite is good.  Objective: Vital signs in last 24 hours: Temp:  [97.6 F (36.4 C)] 97.6 F (36.4 C) (03/31 1733) Pulse Rate:  [103] 103 (03/31 1733) Resp:  [20] 20 (03/31 1733) BP: (99)/(62) 99/62 mmHg (03/31 1733) SpO2:  [91 %-96 %] 91 % (03/31 2146)  Intake/Output from previous day: 03/31 0701 - 04/01 0700 In: 720 [P.O.:720] Out: 2550 [Urine:2550] Intake/Output this shift:    No results for input(Tyler): HGB in the last 72 hours. No results for input(Tyler): WBC, RBC, HCT, PLT in the last 72 hours.  Recent Labs  01/25/16 0616 01/26/16 0733  NA 139 139  K 4.4 4.1  CL 106 107  CO2 20* 20*  BUN 83* 69*  CREATININE 3.58* 3.06*  GLUCOSE 102* 107*  CALCIUM 8.6* 8.6*   No results for input(Tyler): LABPT, INR in the last 72 hours.  Generally patient sleepy but arousable and answers questions. Chest is clear to auscultation Heart exam regular rate and rhythm Extremities 1+ edema Assessment/Plan: Problem #1 acute kidney injury superimposed on chronic. Her renal function continued to improve. Presently patient doesn't have any uremic signs and symptoms. Her creatinine is still remain above her baseline. Problem #2 hyperkalemia: Potassium has corrected Problem #3 hypertension: Her blood pressure seems his reasonably controlled. Problem #4 history of UTI: The patient is a febrile and her white blood cell count is normal. Problem #5 chronic renal failure: Baseline creatinine about 1.9 stage III. Problem #6 anemia: Her hemoglobin is stable. Problem #7 low CO2 possibly metabolic acidosis: Presently stable. Problem #8 history of CHF. Presently she is on Lasix and she had about 2500 mL of urine output and is improving. Plan: 1] We'll DC IV fluid 2] continue with Lasix 3] We'll check her basic metabolic panel and CBC in the  morning   Michaela Tyler 01/26/2016, 8:43 AM

## 2016-01-26 NOTE — Progress Notes (Signed)
2013 Patient requesting Xanax 0.25mg  that she takes at home and is listed on her PTA med list. Mid-level notified and order requested for Xanax 0.25mg . Patient aware.

## 2016-01-27 LAB — CBC
HEMATOCRIT: 40.2 % (ref 36.0–46.0)
HEMOGLOBIN: 13 g/dL (ref 12.0–15.0)
MCH: 31.2 pg (ref 26.0–34.0)
MCHC: 32.3 g/dL (ref 30.0–36.0)
MCV: 96.4 fL (ref 78.0–100.0)
Platelets: 174 10*3/uL (ref 150–400)
RBC: 4.17 MIL/uL (ref 3.87–5.11)
RDW: 18.9 % — ABNORMAL HIGH (ref 11.5–15.5)
WBC: 5.8 10*3/uL (ref 4.0–10.5)

## 2016-01-27 LAB — RENAL FUNCTION PANEL
ALBUMIN: 3 g/dL — AB (ref 3.5–5.0)
ANION GAP: 12 (ref 5–15)
BUN: 61 mg/dL — ABNORMAL HIGH (ref 6–20)
CALCIUM: 8.6 mg/dL — AB (ref 8.9–10.3)
CO2: 24 mmol/L (ref 22–32)
Chloride: 102 mmol/L (ref 101–111)
Creatinine, Ser: 2.75 mg/dL — ABNORMAL HIGH (ref 0.44–1.00)
GFR calc non Af Amer: 14 mL/min — ABNORMAL LOW (ref >60)
GFR, EST AFRICAN AMERICAN: 16 mL/min — AB (ref >60)
Glucose, Bld: 100 mg/dL — ABNORMAL HIGH (ref 65–99)
PHOSPHORUS: 3.3 mg/dL (ref 2.5–4.6)
POTASSIUM: 4.3 mmol/L (ref 3.5–5.1)
SODIUM: 138 mmol/L (ref 135–145)

## 2016-01-27 LAB — CBC AND DIFFERENTIAL: WBC: 5.6 10^3/mL

## 2016-01-27 NOTE — Progress Notes (Signed)
Subjective: Patient offers no complaints today  Objective: Vital signs in last 24 hours: Temp:  [97 F (36.1 C)-97.8 F (36.6 C)] 97.6 F (36.4 C) (04/02 0645) Pulse Rate:  [97-102] 97 (04/02 0645) Resp:  [17-20] 17 (04/02 0645) BP: (96-105)/(64-80) 105/64 mmHg (04/02 0645) SpO2:  [97 %-100 %] 100 % (04/02 0645)  Intake/Output from previous day: 04/01 0701 - 04/02 0700 In: 720 [P.O.:720] Out: 3850 [Urine:3850] Intake/Output this shift:     Recent Labs  01/27/16 0628  HGB 13.0    Recent Labs  01/27/16 0628  WBC 5.8  RBC 4.17  HCT 40.2  PLT 174    Recent Labs  01/26/16 0733 01/27/16 0628  NA 139 138  K 4.1 4.3  CL 107 102  CO2 20* 24  BUN 69* 61*  CREATININE 3.06* 2.75*  GLUCOSE 107* 100*  CALCIUM 8.6* 8.6*   No results for input(s): LABPT, INR in the last 72 hours.  Generally patient sleepy but arousable and answers questions. Chest is clear to auscultation Heart exam regular rate and rhythm Extremities 1+ edema Assessment/Plan: Problem #1 acute kidney injury superimposed on chronic. Her renal function continued to improve. Patient is asymptomatic. Problem #2 hyperkalemia: Potassium has corrected Problem #3 hypertension: Her blood pressure is well controlled. Problem #4 history of UTI: The patient is a febrile and her white blood cell count is normal. Problem #5 chronic renal failure: Baseline creatinine about 1.9 stage III. Problem #6 anemia: Her hemoglobin is stable. Problem #7 low CO2 possibly metabolic acidosis: Presently stable. Problem #8 history of CHF. Presently she is on Lasix and she had about 3800 mL. Still patient with edema but improving Plan: 1] continue with Lasix 2] We'll check her basic metabolic panel in the morning   Jerilyn Gillaspie S 01/27/2016, 9:07 AM

## 2016-01-27 NOTE — Progress Notes (Signed)
Subjective: Patient is more awake. Her real function is gradually improved. Her leg edema has persisted.  Objective: Vital signs in last 24 hours: Temp:  [97 F (36.1 C)-97.8 F (36.6 C)] 97.6 F (36.4 C) (04/02 0645) Pulse Rate:  [97-102] 97 (04/02 0645) Resp:  [17-20] 17 (04/02 0645) BP: (96-105)/(64-80) 105/64 mmHg (04/02 0645) SpO2:  [97 %-100 %] 100 % (04/02 0645) Weight change:  Last BM Date: 01/24/16  Intake/Output from previous day: 04/01 0701 - 04/02 0700 In: 720 [P.O.:720] Out: 3850 [Urine:3850]  PHYSICAL EXAM General appearance: no distress, moderately obese and slowed mentation Resp: clear to auscultation bilaterally Cardio: S1, S2 normal GI: soft, non-tender; bowel sounds normal; no masses,  no organomegaly Extremities: edema 2++ leg edema  Lab Results:  Results for orders placed or performed during the hospital encounter of 01/22/16 (from the past 48 hour(s))  Glucose, capillary     Status: Abnormal   Collection Time: 01/25/16  9:15 AM  Result Value Ref Range   Glucose-Capillary 145 (H) 65 - 99 mg/dL  Phosphorus     Status: None   Collection Time: 01/26/16  5:00 AM  Result Value Ref Range   Phosphorus 3.4 2.5 - 4.6 mg/dL  Comprehensive metabolic panel     Status: Abnormal   Collection Time: 01/26/16  7:33 AM  Result Value Ref Range   Sodium 139 135 - 145 mmol/L   Potassium 4.1 3.5 - 5.1 mmol/L   Chloride 107 101 - 111 mmol/L   CO2 20 (L) 22 - 32 mmol/L   Glucose, Bld 107 (H) 65 - 99 mg/dL   BUN 69 (H) 6 - 20 mg/dL   Creatinine, Ser 3.06 (H) 0.44 - 1.00 mg/dL   Calcium 8.6 (L) 8.9 - 10.3 mg/dL   Total Protein 5.9 (L) 6.5 - 8.1 g/dL   Albumin 2.9 (L) 3.5 - 5.0 g/dL   AST 25 15 - 41 U/L   ALT 28 14 - 54 U/L   Alkaline Phosphatase 138 (H) 38 - 126 U/L   Total Bilirubin 1.6 (H) 0.3 - 1.2 mg/dL   GFR calc non Af Amer 12 (L) >60 mL/min   GFR calc Af Amer 14 (L) >60 mL/min    Comment: (NOTE) The eGFR has been calculated using the CKD EPI equation. This  calculation has not been validated in all clinical situations. eGFR's persistently <60 mL/min signify possible Chronic Kidney Disease.    Anion gap 12 5 - 15  Creatinine, urine, random     Status: None   Collection Time: 01/26/16  9:02 PM  Result Value Ref Range   Creatinine, Urine 25.22 mg/dL  Sodium, urine, random     Status: None   Collection Time: 01/26/16  9:02 PM  Result Value Ref Range   Sodium, Ur 114 mmol/L  Protein, urine, random     Status: None   Collection Time: 01/26/16  9:02 PM  Result Value Ref Range   Total Protein, Urine 4 mg/dL    Comment: NO NORMAL RANGE ESTABLISHED FOR THIS TEST  CBC     Status: Abnormal   Collection Time: 01/27/16  6:28 AM  Result Value Ref Range   WBC 5.8 4.0 - 10.5 K/uL   RBC 4.17 3.87 - 5.11 MIL/uL   Hemoglobin 13.0 12.0 - 15.0 g/dL   HCT 40.2 36.0 - 46.0 %   MCV 96.4 78.0 - 100.0 fL   MCH 31.2 26.0 - 34.0 pg   MCHC 32.3 30.0 - 36.0 g/dL  RDW 18.9 (H) 11.5 - 15.5 %   Platelets 174 150 - 400 K/uL  Renal function panel     Status: Abnormal   Collection Time: 01/27/16  6:28 AM  Result Value Ref Range   Sodium 138 135 - 145 mmol/L   Potassium 4.3 3.5 - 5.1 mmol/L   Chloride 102 101 - 111 mmol/L   CO2 24 22 - 32 mmol/L   Glucose, Bld 100 (H) 65 - 99 mg/dL   BUN 61 (H) 6 - 20 mg/dL   Creatinine, Ser 2.75 (H) 0.44 - 1.00 mg/dL   Calcium 8.6 (L) 8.9 - 10.3 mg/dL   Phosphorus 3.3 2.5 - 4.6 mg/dL   Albumin 3.0 (L) 3.5 - 5.0 g/dL   GFR calc non Af Amer 14 (L) >60 mL/min   GFR calc Af Amer 16 (L) >60 mL/min    Comment: (NOTE) The eGFR has been calculated using the CKD EPI equation. This calculation has not been validated in all clinical situations. eGFR's persistently <60 mL/min signify possible Chronic Kidney Disease.    Anion gap 12 5 - 15    ABGS No results for input(s): PHART, PO2ART, TCO2, HCO3 in the last 72 hours.  Invalid input(s): PCO2 CULTURES Recent Results (from the past 240 hour(s))  Urine culture     Status: None    Collection Time: 01/22/16  2:15 PM  Result Value Ref Range Status   Specimen Description URINE, CATHETERIZED  Final   Special Requests NONE  Final   Culture   Final    >=100,000 COLONIES/mL ESCHERICHIA COLI Performed at [facility]    Report Status 01/25/2016 FINAL  Final   Organism ID, Bacteria ESCHERICHIA COLI  Final      Susceptibility   Escherichia coli - MIC*    AMPICILLIN >=32 RESISTANT Resistant     CEFAZOLIN <=4 SENSITIVE Sensitive     CEFTRIAXONE <=1 SENSITIVE Sensitive     CIPROFLOXACIN >=4 RESISTANT Resistant     GENTAMICIN >=16 RESISTANT Resistant     IMIPENEM <=0.25 SENSITIVE Sensitive     NITROFURANTOIN <=16 SENSITIVE Sensitive     TRIMETH/SULFA <=20 SENSITIVE Sensitive     AMPICILLIN/SULBACTAM 16 INTERMEDIATE Intermediate     PIP/TAZO <=4 SENSITIVE Sensitive     * >=100,000 COLONIES/mL ESCHERICHIA COLI   Studies/Results: No results found.  Medications: I have reviewed the patient's current medications.  Assesment:   Principal Problem:   AKI (acute kidney injury) ([place]) Active Problems:   Hypothyroidism   UTI (urinary tract infection)   Chronic combined systolic and diastolic CHF (congestive heart failure) (HCC)   ARF (acute renal failure) ([place])    Plan:  Medications reviewed Continue  IV antibiotic Will monitor BMP. Will continue current treatment as per nephrology recommendation    LOS: 5 days   Jama Mcmiller 01/27/2016, 8:21 AM

## 2016-01-28 LAB — RENAL FUNCTION PANEL
ALBUMIN: 2.8 g/dL — AB (ref 3.5–5.0)
Anion gap: 12 (ref 5–15)
BUN: 54 mg/dL — AB (ref 6–20)
CALCIUM: 8.6 mg/dL — AB (ref 8.9–10.3)
CO2: 24 mmol/L (ref 22–32)
Chloride: 102 mmol/L (ref 101–111)
Creatinine, Ser: 2.38 mg/dL — ABNORMAL HIGH (ref 0.44–1.00)
GFR calc Af Amer: 19 mL/min — ABNORMAL LOW (ref >60)
GFR calc non Af Amer: 17 mL/min — ABNORMAL LOW (ref >60)
GLUCOSE: 101 mg/dL — AB (ref 65–99)
PHOSPHORUS: 3.3 mg/dL (ref 2.5–4.6)
Potassium: 3.5 mmol/L (ref 3.5–5.1)
SODIUM: 138 mmol/L (ref 135–145)

## 2016-01-28 LAB — IMMUNOFIXATION ELECTROPHORESIS
IgA: 295 mg/dL (ref 64–422)
IgG (Immunoglobin G), Serum: 937 mg/dL (ref 700–1600)
IgM, Serum: 135 mg/dL (ref 26–217)
Total Protein ELP: 5.7 g/dL — ABNORMAL LOW (ref 6.0–8.5)

## 2016-01-28 LAB — MICROALBUMIN / CREATININE URINE RATIO
Creatinine, Urine: 21.4 mg/dL
Microalb Creat Ratio: 18.2 mg/g creat (ref 0.0–30.0)
Microalb, Ur: 3.9 ug/mL — ABNORMAL HIGH

## 2016-01-28 MED ORDER — TORSEMIDE 20 MG PO TABS
40.0000 mg | ORAL_TABLET | Freq: Every day | ORAL | Status: DC
  Administered 2016-01-28 – 2016-01-29 (×2): 40 mg via ORAL
  Filled 2016-01-28 (×2): qty 2

## 2016-01-28 MED ORDER — VITAMIN D (ERGOCALCIFEROL) 1.25 MG (50000 UNIT) PO CAPS
50000.0000 [IU] | ORAL_CAPSULE | ORAL | Status: DC
  Administered 2016-01-28: 50000 [IU] via ORAL
  Filled 2016-01-28: qty 1

## 2016-01-28 NOTE — Progress Notes (Signed)
Patient continues to require admission/acute care setting. No changes in plans for patient, she will return to SNF at DC per spouse and family wishes.   LCSW has updated FL2 and facility is agreeable for return.  Will continue to follow acutely with patient for disposition.  Michaela EmoryHannah Roshni Burbano LCSW, MSW Clinical Social Work: [facility] [phone]

## 2016-01-28 NOTE — Progress Notes (Signed)
Subjective: Patient is resting. Her diuretic is changed to Demodex by nephrology. Lab result is pending. Her leg edema is slightly improving.  Objective: Vital signs in last 24 hours: Temp:  [98.2 F (36.8 C)-98.9 F (37.2 C)] 98.9 F (37.2 C) (04/03 0558) Pulse Rate:  [103-138] 135 (04/03 0558) Resp:  [18-20] 18 (04/03 0558) BP: (100-119)/(68-79) 105/79 mmHg (04/03 0558) SpO2:  [97 %-99 %] 97 % (04/03 0558) Weight change:  Last BM Date: 01/27/16  Intake/Output from previous day: 04/02 0701 - 04/03 0700 In: 630 [P.O.:580; IV Piggyback:50] Out: 3050 [Urine:3050]  PHYSICAL EXAM General appearance: no distress, moderately obese and slowed mentation Resp: clear to auscultation bilaterally Cardio: S1, S2 normal GI: soft, non-tender; bowel sounds normal; no masses,  no organomegaly Extremities: edema 2++ leg edema  Lab Results:  Results for orders placed or performed during the hospital encounter of 01/22/16 (from the past 48 hour(s))  Urine culture     Status: None (Preliminary result)   Collection Time: 01/26/16  9:02 PM  Result Value Ref Range   Specimen Description URINE, CATHETERIZED    Special Requests NONE    Culture      NO GROWTH < 24 HOURS Performed at [facility]    Report Status PENDING   Creatinine, urine, random     Status: None   Collection Time: 01/26/16  9:02 PM  Result Value Ref Range   Creatinine, Urine 25.22 mg/dL  Sodium, urine, random     Status: None   Collection Time: 01/26/16  9:02 PM  Result Value Ref Range   Sodium, Ur 114 mmol/L  Protein, urine, random     Status: None   Collection Time: 01/26/16  9:02 PM  Result Value Ref Range   Total Protein, Urine 4 mg/dL    Comment: NO NORMAL RANGE ESTABLISHED FOR THIS TEST  Microalbumin / creatinine urine ratio     Status: Abnormal   Collection Time: 01/26/16  9:02 PM  Result Value Ref Range   Microalb, Ur 3.9 (H) Not Estab. ug/mL   Microalb Creat Ratio 18.2 0.0 - 30.0 mg/g creat     Comment: (NOTE) Performed At: [facility] [place], [place] [id number] Lindon Romp MD MV:[id number]    Creatinine, Urine 21.4 Not Estab. mg/dL  CBC     Status: Abnormal   Collection Time: 01/27/16  6:28 AM  Result Value Ref Range   WBC 5.8 4.0 - 10.5 K/uL   RBC 4.17 3.87 - 5.11 MIL/uL   Hemoglobin 13.0 12.0 - 15.0 g/dL   HCT 40.2 36.0 - 46.0 %   MCV 96.4 78.0 - 100.0 fL   MCH 31.2 26.0 - 34.0 pg   MCHC 32.3 30.0 - 36.0 g/dL   RDW 18.9 (H) 11.5 - 15.5 %   Platelets 174 150 - 400 K/uL  Renal function panel     Status: Abnormal   Collection Time: 01/27/16  6:28 AM  Result Value Ref Range   Sodium 138 135 - 145 mmol/L   Potassium 4.3 3.5 - 5.1 mmol/L   Chloride 102 101 - 111 mmol/L   CO2 24 22 - 32 mmol/L   Glucose, Bld 100 (H) 65 - 99 mg/dL   BUN 61 (H) 6 - 20 mg/dL   Creatinine, Ser 2.75 (H) 0.44 - 1.00 mg/dL   Calcium 8.6 (L) 8.9 - 10.3 mg/dL   Phosphorus 3.3 2.5 - 4.6 mg/dL   Albumin 3.0 (L) 3.5 - 5.0 g/dL   GFR  calc non Af Amer 14 (L) >60 mL/min   GFR calc Af Amer 16 (L) >60 mL/min    Comment: (NOTE) The eGFR has been calculated using the CKD EPI equation. This calculation has not been validated in all clinical situations. eGFR's persistently <60 mL/min signify possible Chronic Kidney Disease.    Anion gap 12 5 - 15  Renal function panel     Status: Abnormal   Collection Time: 01/28/16  5:34 AM  Result Value Ref Range   Sodium 138 135 - 145 mmol/L   Potassium 3.5 3.5 - 5.1 mmol/L    Comment: DELTA CHECK NOTED   Chloride 102 101 - 111 mmol/L   CO2 24 22 - 32 mmol/L   Glucose, Bld 101 (H) 65 - 99 mg/dL   BUN 54 (H) 6 - 20 mg/dL   Creatinine, Ser 2.38 (H) 0.44 - 1.00 mg/dL   Calcium 8.6 (L) 8.9 - 10.3 mg/dL   Phosphorus 3.3 2.5 - 4.6 mg/dL   Albumin 2.8 (L) 3.5 - 5.0 g/dL   GFR calc non Af Amer 17 (L) >60 mL/min   GFR calc Af Amer 19 (L) >60 mL/min    Comment: (NOTE) The eGFR has been calculated using the CKD EPI equation. This  calculation has not been validated in all clinical situations. eGFR's persistently <60 mL/min signify possible Chronic Kidney Disease.    Anion gap 12 5 - 15    ABGS No results for input(s): PHART, PO2ART, TCO2, HCO3 in the last 72 hours.  Invalid input(s): PCO2 CULTURES Recent Results (from the past 240 hour(s))  Urine culture     Status: None   Collection Time: 01/22/16  2:15 PM  Result Value Ref Range Status   Specimen Description URINE, CATHETERIZED  Final   Special Requests NONE  Final   Culture   Final    >=100,000 COLONIES/mL ESCHERICHIA COLI Performed at [facility]    Report Status 01/25/2016 FINAL  Final   Organism ID, Bacteria ESCHERICHIA COLI  Final      Susceptibility   Escherichia coli - MIC*    AMPICILLIN >=32 RESISTANT Resistant     CEFAZOLIN <=4 SENSITIVE Sensitive     CEFTRIAXONE <=1 SENSITIVE Sensitive     CIPROFLOXACIN >=4 RESISTANT Resistant     GENTAMICIN >=16 RESISTANT Resistant     IMIPENEM <=0.25 SENSITIVE Sensitive     NITROFURANTOIN <=16 SENSITIVE Sensitive     TRIMETH/SULFA <=20 SENSITIVE Sensitive     AMPICILLIN/SULBACTAM 16 INTERMEDIATE Intermediate     PIP/TAZO <=4 SENSITIVE Sensitive     * >=100,000 COLONIES/mL ESCHERICHIA COLI  Urine culture     Status: None (Preliminary result)   Collection Time: 01/26/16  9:02 PM  Result Value Ref Range Status   Specimen Description URINE, CATHETERIZED  Final   Special Requests NONE  Final   Culture   Final    NO GROWTH < 24 HOURS Performed at [facility]    Report Status PENDING  Incomplete   Studies/Results: No results found.  Medications: I have reviewed the patient's current medications.  Assesment:   Principal Problem:   AKI (acute kidney injury) ([place]) Active Problems:   Hypothyroidism   UTI (urinary tract infection)   Chronic combined systolic and diastolic CHF (congestive heart failure) ([facility])   ARF (acute renal failure) ([place])    Plan:  Medications  reviewed Continue  IV antibiotic Will monitor BMP. Continue oral diutetics    LOS: 6 days   Rosslyn Pasion 01/28/2016, 1:37  PM   

## 2016-01-28 NOTE — Progress Notes (Signed)
Subjective: Patient denies any nausea or vomiting. She states that she is feeling better.  Objective: Vital signs in last 24 hours: Temp:  [98.2 F (36.8 C)-98.9 F (37.2 C)] 98.9 F (37.2 C) (04/03 0558) Pulse Rate:  [103-138] 135 (04/03 0558) Resp:  [18-20] 18 (04/03 0558) BP: (100-119)/(68-79) 105/79 mmHg (04/03 0558) SpO2:  [97 %-99 %] 97 % (04/03 0558)  Intake/Output from previous day: 04/02 0701 - 04/03 0700 In: 630 [P.O.:580; IV Piggyback:50] Out: 3050 [Urine:3050] Intake/Output this shift:     Recent Labs  01/27/16 0628  HGB 13.0    Recent Labs  01/27/16 0628  WBC 5.8  RBC 4.17  HCT 40.2  PLT 174    Recent Labs  01/27/16 0628 01/28/16 0534  NA 138 138  K 4.3 3.5  CL 102 102  CO2 24 24  BUN 61* 54*  CREATININE 2.75* 2.38*  GLUCOSE 100* 101*  CALCIUM 8.6* 8.6*   No results for input(s): LABPT, INR in the last 72 hours.  Generally patient sleepy but arousable and answers questions. Chest is clear to auscultation Heart exam regular rate and rhythm Extremities 1+ edema Assessment/Plan: Problem #1 acute kidney injury superimposed on chronic. Her BUN and creatinine continuously improving. Patient also does not have any uremic signs and symptoms. Problem #2 hyperkalemia: Potassium has corrected Problem #3 hypertension: Her blood pressure is well controlled. Problem #4 history of UTI: The patient is a febrile and her white blood cell count is normal. Problem #5 chronic renal failure: Baseline creatinine about 1.9 stage III. Problem #6 anemia: Her hemoglobin is stable. Problem #7 low CO2 possibly metabolic acidosis: Has corrected. Problem #8 history of CHF. Presently she is on Lasix and she had about 3000 mL. patient denies any difficulty in breathing. Problem #9 metabolic bone disease: Her calcium and phosphorus is range. PTH is high. Her vitamin D level is very low. Patient was vitamin D deficiency. Plan: 1] we'll DC Lasix 2] we'll start patient on  Demadex 40 mg by mouth daily. 3] We'll check her basic metabolic panel in the morning 4] we'll start patient on ergocalciferol 50,000 units by mouth once a week.   Haliegh Khurana S 01/28/2016, 8:38 AM

## 2016-01-29 ENCOUNTER — Inpatient Hospital Stay
Admission: RE | Admit: 2016-01-29 | Discharge: 2016-02-01 | Disposition: A | Discharge status: Nursing Home (Includes ICF) | Payer: Medicare Other | Source: Ambulatory Visit | Attending: Internal Medicine | Admitting: Internal Medicine

## 2016-01-29 ENCOUNTER — Encounter: Payer: Self-pay | Admitting: Internal Medicine

## 2016-01-29 ENCOUNTER — Non-Acute Institutional Stay (SKILLED_NURSING_FACILITY): Payer: Medicare Other | Admitting: Internal Medicine

## 2016-01-29 DIAGNOSIS — M1990 Unspecified osteoarthritis, unspecified site: Secondary | ICD-10-CM | POA: Diagnosis not present

## 2016-01-29 DIAGNOSIS — E039 Hypothyroidism, unspecified: Secondary | ICD-10-CM | POA: Diagnosis not present

## 2016-01-29 DIAGNOSIS — I482 Chronic atrial fibrillation: Secondary | ICD-10-CM | POA: Diagnosis not present

## 2016-01-29 DIAGNOSIS — I509 Heart failure, unspecified: Secondary | ICD-10-CM | POA: Diagnosis not present

## 2016-01-29 DIAGNOSIS — E871 Hypo-osmolality and hyponatremia: Secondary | ICD-10-CM | POA: Diagnosis not present

## 2016-01-29 DIAGNOSIS — R488 Other symbolic dysfunctions: Secondary | ICD-10-CM | POA: Diagnosis not present

## 2016-01-29 DIAGNOSIS — I2510 Atherosclerotic heart disease of native coronary artery without angina pectoris: Secondary | ICD-10-CM | POA: Diagnosis not present

## 2016-01-29 DIAGNOSIS — I4891 Unspecified atrial fibrillation: Secondary | ICD-10-CM

## 2016-01-29 DIAGNOSIS — I10 Essential (primary) hypertension: Secondary | ICD-10-CM | POA: Diagnosis not present

## 2016-01-29 DIAGNOSIS — X58XXXA Exposure to other specified factors, initial encounter: Secondary | ICD-10-CM | POA: Diagnosis present

## 2016-01-29 DIAGNOSIS — E7800 Pure hypercholesterolemia, unspecified: Secondary | ICD-10-CM | POA: Diagnosis not present

## 2016-01-29 DIAGNOSIS — Z9181 History of falling: Secondary | ICD-10-CM | POA: Diagnosis not present

## 2016-01-29 DIAGNOSIS — I5023 Acute on chronic systolic (congestive) heart failure: Secondary | ICD-10-CM

## 2016-01-29 DIAGNOSIS — N170 Acute kidney failure with tubular necrosis: Secondary | ICD-10-CM | POA: Diagnosis not present

## 2016-01-29 DIAGNOSIS — E785 Hyperlipidemia, unspecified: Secondary | ICD-10-CM | POA: Diagnosis not present

## 2016-01-29 DIAGNOSIS — F411 Generalized anxiety disorder: Secondary | ICD-10-CM | POA: Diagnosis present

## 2016-01-29 DIAGNOSIS — M2550 Pain in unspecified joint: Secondary | ICD-10-CM | POA: Diagnosis not present

## 2016-01-29 DIAGNOSIS — I9589 Other hypotension: Secondary | ICD-10-CM | POA: Diagnosis not present

## 2016-01-29 DIAGNOSIS — N184 Chronic kidney disease, stage 4 (severe): Secondary | ICD-10-CM | POA: Diagnosis not present

## 2016-01-29 DIAGNOSIS — Z96652 Presence of left artificial knee joint: Secondary | ICD-10-CM | POA: Diagnosis not present

## 2016-01-29 DIAGNOSIS — N179 Acute kidney failure, unspecified: Secondary | ICD-10-CM

## 2016-01-29 DIAGNOSIS — A419 Sepsis, unspecified organism: Secondary | ICD-10-CM | POA: Diagnosis not present

## 2016-01-29 DIAGNOSIS — N390 Urinary tract infection, site not specified: Secondary | ICD-10-CM

## 2016-01-29 DIAGNOSIS — F329 Major depressive disorder, single episode, unspecified: Secondary | ICD-10-CM | POA: Diagnosis not present

## 2016-01-29 DIAGNOSIS — N289 Disorder of kidney and ureter, unspecified: Secondary | ICD-10-CM | POA: Diagnosis not present

## 2016-01-29 DIAGNOSIS — Z79899 Other long term (current) drug therapy: Secondary | ICD-10-CM | POA: Diagnosis not present

## 2016-01-29 DIAGNOSIS — R278 Other lack of coordination: Secondary | ICD-10-CM | POA: Diagnosis not present

## 2016-01-29 DIAGNOSIS — F419 Anxiety disorder, unspecified: Secondary | ICD-10-CM | POA: Diagnosis present

## 2016-01-29 DIAGNOSIS — Z66 Do not resuscitate: Secondary | ICD-10-CM | POA: Diagnosis not present

## 2016-01-29 DIAGNOSIS — I129 Hypertensive chronic kidney disease with stage 1 through stage 4 chronic kidney disease, or unspecified chronic kidney disease: Secondary | ICD-10-CM | POA: Diagnosis not present

## 2016-01-29 DIAGNOSIS — R000 Tachycardia, unspecified: Secondary | ICD-10-CM | POA: Diagnosis present

## 2016-01-29 DIAGNOSIS — N189 Chronic kidney disease, unspecified: Secondary | ICD-10-CM | POA: Diagnosis not present

## 2016-01-29 DIAGNOSIS — F0390 Unspecified dementia without behavioral disturbance: Secondary | ICD-10-CM | POA: Diagnosis not present

## 2016-01-29 DIAGNOSIS — I5043 Acute on chronic combined systolic (congestive) and diastolic (congestive) heart failure: Secondary | ICD-10-CM | POA: Diagnosis not present

## 2016-01-29 DIAGNOSIS — S31801A Laceration without foreign body of unspecified buttock, initial encounter: Secondary | ICD-10-CM | POA: Diagnosis not present

## 2016-01-29 DIAGNOSIS — I130 Hypertensive heart and chronic kidney disease with heart failure and stage 1 through stage 4 chronic kidney disease, or unspecified chronic kidney disease: Secondary | ICD-10-CM | POA: Diagnosis not present

## 2016-01-29 DIAGNOSIS — I481 Persistent atrial fibrillation: Secondary | ICD-10-CM | POA: Diagnosis not present

## 2016-01-29 DIAGNOSIS — K219 Gastro-esophageal reflux disease without esophagitis: Secondary | ICD-10-CM | POA: Diagnosis not present

## 2016-01-29 DIAGNOSIS — I471 Supraventricular tachycardia: Secondary | ICD-10-CM | POA: Diagnosis not present

## 2016-01-29 DIAGNOSIS — Z7982 Long term (current) use of aspirin: Secondary | ICD-10-CM | POA: Diagnosis not present

## 2016-01-29 DIAGNOSIS — N178 Other acute kidney failure: Secondary | ICD-10-CM | POA: Diagnosis not present

## 2016-01-29 DIAGNOSIS — M6281 Muscle weakness (generalized): Secondary | ICD-10-CM | POA: Diagnosis not present

## 2016-01-29 DIAGNOSIS — E861 Hypovolemia: Secondary | ICD-10-CM | POA: Diagnosis not present

## 2016-01-29 DIAGNOSIS — R1312 Dysphagia, oropharyngeal phase: Secondary | ICD-10-CM | POA: Diagnosis not present

## 2016-01-29 DIAGNOSIS — E872 Acidosis: Secondary | ICD-10-CM | POA: Diagnosis not present

## 2016-01-29 DIAGNOSIS — E860 Dehydration: Secondary | ICD-10-CM | POA: Diagnosis not present

## 2016-01-29 DIAGNOSIS — D649 Anemia, unspecified: Secondary | ICD-10-CM | POA: Diagnosis not present

## 2016-01-29 DIAGNOSIS — I959 Hypotension, unspecified: Secondary | ICD-10-CM | POA: Diagnosis not present

## 2016-01-29 DIAGNOSIS — I517 Cardiomegaly: Secondary | ICD-10-CM | POA: Diagnosis not present

## 2016-01-29 LAB — RENAL FUNCTION PANEL
ALBUMIN: 2.8 g/dL — AB (ref 3.5–5.0)
ANION GAP: 12 (ref 5–15)
BUN: 47 mg/dL — ABNORMAL HIGH (ref 6–20)
CALCIUM: 8 mg/dL — AB (ref 8.9–10.3)
CO2: 25 mmol/L (ref 22–32)
Chloride: 99 mmol/L — ABNORMAL LOW (ref 101–111)
Creatinine, Ser: 2.26 mg/dL — ABNORMAL HIGH (ref 0.44–1.00)
GFR calc non Af Amer: 18 mL/min — ABNORMAL LOW (ref >60)
GFR, EST AFRICAN AMERICAN: 21 mL/min — AB (ref >60)
Glucose, Bld: 90 mg/dL (ref 65–99)
Phosphorus: 3.3 mg/dL (ref 2.5–4.6)
Potassium: 3.6 mmol/L (ref 3.5–5.1)
Sodium: 136 mmol/L (ref 135–145)

## 2016-01-29 LAB — URINE CULTURE: CULTURE: NO GROWTH

## 2016-01-29 LAB — BASIC METABOLIC PANEL
BUN: 47 mg/dL — AB (ref 4–21)
Creatinine: 2.3 mg/dL — AB (ref 0.5–1.1)
GLUCOSE: 90 mg/dL
SODIUM: 136 mmol/L — AB (ref 137–147)

## 2016-01-29 MED ORDER — TRAMADOL HCL 50 MG PO TABS: 50.0000 mg | ORAL_TABLET | Freq: Two times a day (BID) | ORAL | Status: AC | PRN

## 2016-01-29 MED ORDER — TORSEMIDE 20 MG PO TABS: 40.0000 mg | ORAL_TABLET | Freq: Every day | ORAL | Status: AC

## 2016-01-29 MED ORDER — VITAMIN D (ERGOCALCIFEROL) 1.25 MG (50000 UNIT) PO CAPS: 50000.0000 [IU] | ORAL_CAPSULE | ORAL | Status: AC

## 2016-01-29 MED ORDER — ALPRAZOLAM 0.25 MG PO TABS: 0.2500 mg | ORAL_TABLET | Freq: Two times a day (BID) | ORAL | Status: AC | PRN

## 2016-01-29 NOTE — Assessment & Plan Note (Signed)
Clinical monitor for recurrent urinary tract infections. If such demonstrated; urology evaluation

## 2016-01-29 NOTE — Care Management Important Message (Signed)
Important Message  Patient Details  Name: Arlyn LeakGarnett M Coleman MRN: [id number] Date of Birth: 1921/06/16   Medicare Important Message Given:  Yes    Malcolm MetroChildress, Aija Scarfo Demske, RN 01/29/2016, 10:11 AM

## 2016-01-29 NOTE — Care Management Note (Signed)
Case Management Note  Patient Details  Name: Arlyn LeakGarnett M Rikard MRN: [id number] Date of Birth: 12-05-1920  Subjective/Objective:                  Pt admitted with AKI. [facility]   Action/Plan: Pt returning to SNF today. CSW has arranged for return to facility. No CM needs identified.   Expected Discharge Date:    01/29/2016              Expected Discharge Plan:  Skilled Nursing Facility  In-House Referral:  Clinical Social Work  Discharge planning Services  CM Consult  Post Acute Care Choice:  NA Choice offered to:  NA  DME Arranged:    DME Agency:     HH Arranged:    HH Agency:     Status of Service:  Completed, signed off  Medicare Important Message Given:  Yes Date Medicare IM Given:    Medicare IM give by:    Date Additional Medicare IM Given:    Additional Medicare Important Message give by:     If discussed at Long Length of Stay Meetings, dates discussed:  01/29/2016  Additional Comments:  Malcolm MetroChildress, Vernie Vinciguerra Demske, RN 01/29/2016, 10:12 AM

## 2016-01-29 NOTE — Assessment & Plan Note (Signed)
Serial monitor of renal function 

## 2016-01-29 NOTE — Assessment & Plan Note (Signed)
See concerns with the channel blocker and amiodarone Metoprolol will be continued and titrated as necessary for rate control

## 2016-01-29 NOTE — Progress Notes (Signed)
Subjective: Patient denies any nausea or vomiting. No difficulty in breathing  Objective: Vital signs in last 24 hours: Temp:  [97.9 F (36.6 C)-98.2 F (36.8 C)] 98 F (36.7 C) (04/04 0500) Pulse Rate:  [135-138] 135 (04/04 0500) Resp:  [20-21] 20 (04/04 0500) BP: (89-110)/(63-76) 100/63 mmHg (04/04 0500) SpO2:  [92 %-98 %] 98 % (04/04 0500)  Intake/Output from previous day: 04/03 0701 - 04/04 0700 In: 770 [P.O.:720; IV Piggyback:50] Out: 2900 [Urine:2900] Intake/Output this shift:     Recent Labs  01/27/16 0628  HGB 13.0    Recent Labs  01/27/16 0628  WBC 5.8  RBC 4.17  HCT 40.2  PLT 174    Recent Labs  01/28/16 0534 01/29/16 0546  NA 138 136  K 3.5 3.6  CL 102 99*  CO2 24 25  BUN 54* 47*  CREATININE 2.38* 2.26*  GLUCOSE 101* 90  CALCIUM 8.6* 8.0*   No results for input(s): LABPT, INR in the last 72 hours.  Generally patient sleepy but arousable and answers questions. Chest is clear to auscultation Heart exam regular rate and rhythm Extremities 1+ edema Assessment/Plan: Problem #1 acute kidney injury superimposed on chronic. Her renal function continued to improve. Presently she is asymptomatic.  Problem #2 hyperkalemia: Potassium has corrected Problem #3 hypertension: Her blood pressure is well controlled. Problem #4 history of UTI: The patient is a febrile and her white blood cell count is normal. Problem #5 chronic renal failure: Baseline creatinine about 1.9 stage III. Problem #6 anemia: Her hemoglobin is normal.  Problem #7 low CO2 possibly metabolic acidosis: Has corrected. Problem #8 history of CHF. patient presently on Demadex 40 mg by mouth daily. She is 2900 mL of urine output. She is asymptomatic but patient has still some leg edema.  Problem #9 metabolic bone disease: Her calcium and phosphorus is range.  Problem #10 vitamin D deficiency: Patient started on vitamin D supplement.  Plan: 1] We'll continue his present management. 2] will  see patient in 4 weeks as outpatient for follow-up .breasts    Copper Kirtley S 01/29/2016, 8:36 AM

## 2016-01-29 NOTE — Patient Instructions (Signed)
See summary under each active problem in the Problem List with associated updated therapeutic plan. Completed document transmitted to [facility]. 

## 2016-01-29 NOTE — Assessment & Plan Note (Signed)
Monitor weights and vital signs for evidence of progressive heart failure

## 2016-01-29 NOTE — Progress Notes (Signed)
Patient ID: Michaela Tyler, female   DOB: 10-10-1921, 82 y.o.   MRN: [id number]      This is a nursing facility Nursing Facility readmission within 30 days. Interim medical record and care since last [facility] visit was updated with review of diagnostic studies and change in clinical status since last visit as documented.  Note: only family and social history pertinent to this assessment are included.  HPI: She was rehospitalized 3/28-01/29/16 with recurrent acute on chronic  renal insufficiency with admission creatinine of 4.88 and BUN of 97. She was seen by Dr. Wolfgang PhoenixBhutani, Nephrologist. Angiotension receptor blocker was discontinued and IV hydration pursued. The latter did worsen her edema in the context of her chronic  systolic and diastolic heart failure. Nephrology evaluation included ultrasound which revealed bilateral renal cystic changes. Serum protein electrophoresis revealed low albumin with no monoclonal protein. The specialist attributed the acute on chronic  insufficiency to her chronic cardiorenal issues; hypertension; and age associated decline. Discharge BUN 47 and creatinine 2.26. GFR was 21  Hospitalization was complicated by Escherichia coli urinary tract infection for  which IV Rocephin was administered.  Positive or pertinent ROS in addition to those noted above in HPI: The responses are questionable as she gave the year as 2020.She states her heart "races" at times and her breath "drags" and  her legs feel "tight". She does admit to some anxiety. She states she has difficulty walking. Initially she intimated this was related to pain but then suggested it might be numbness and tingling as a limiting factor. Constitutional: No fever, chills, significant weight change, fatigue, weakness or night sweats Eyes: No redness, discharge, pain, blurred vision, double vision or loss of vision ENT/mouth: No nasal congestion, postnasal drainage,epistaxis, purulent discharge,  earache, hearing loss, tinnitus ,sore throat , dental pain or hoarseness   Cardiovascular: no chest pain, syncope, nausea, sweating, claudication, or edema  Respiratory: No cough, sputum production,hemoptysis   Gastrointestinal: No heartburn,dysphagia, nausea and vomiting,abdominal pain, change in bowels, anorexia, diarrhea, significant constipation, rectal bleeding, melena,  stool incontinence or jaundice Genitourinary: No dysuria,hematuria, pyuria, frequency, urgency,  incontinence, nocturia, dark urine or flank pain Musculoskeletal: No myalgias or muscle cramping, joint stiffness, joint swelling, joint color change, weakness, or cyanosis Dermatologic: No rash, pruritus, urticaria, or change in color or temperature of skin Neurologic: No headache, vertigo, limb weakness, tremor, gait disturbance, seizures Psychiatric: No significant attacks, insomnia or anorexia Endocrine: No change in hair/skin/ nails, excessive thirst, excessive hunger, excessive urination or unexplained fatigue Hematologic/lymphatic: No bruising, lymphadenopathy or  abnormal clotting Allergy/immunology: No itchy/ watery eyes, abnormal sneezing, rhinitis, urticaria or angioedema.   Pertinent or positive findings: As noted she named the year 2020 but was able to give the month and the president. Her history is somewhat rambling and unfocused. She has complete dentures .Heart rate is irregular and rapid with pulse deficit. Radial pulse is 72. She has 2+ pitting edema of the feet with gross edema or extremities up to the knees. General appearance:Adequately nourished; no acute distress or increased work of breathing is present.   Lymphatic: No  lymphadenopathy about the head, neck, or axilla . Eyes: No conjunctival inflammation or lid edema is present. There is no scleral icterus. Ears:  External ear exam shows no significant lesions or deformities.   Nose:  External nasal examination shows no deformity or inflammation. Nasal  mucosa are pink and moist without lesions or exudates No septal dislocation or deviation.No obstruction to airflow.  Oral exam: lips and  gums are healthy appearing.There is no oropharyngeal erythema or exudate . Neck:  No deformities, thyromegaly, masses, or tenderness noted. Abdomen:Bowel sounds are normal. Abdomen is soft and nontender with no organomegaly, hernias  or masses. GU: deferred as previously addressed. Extremities:  No cyanosis or clubbing noted. Neurologic exam : Cn 2-7 intact Strength equal but generally decreased.  Skin: Warm & dry with slight tenting. No significant lesions or rash.  See summary under each active problem in the Problem List with associated updated therapeutic plan

## 2016-01-29 NOTE — Discharge Summary (Signed)
Physician Discharge Summary  Patient ID: Michaela Tyler MRN: [id number] DOB/AGE: Aug 05, 1921 82 y.o. Primary Care Physician:Zarie Kosiba, MD Admit date: 01/22/2016 Discharge date: 01/29/2016    Discharge Diagnoses:  Principal Problem:   AKI (acute kidney injury) (HCC) Active Problems:   Hypothyroidism   UTI (urinary tract infection)   Chronic combined systolic and diastolic CHF (congestive heart failure) (HCC)   ARF (acute renal failure) (HCC)     Medication List    STOP taking these medications        furosemide 20 MG tablet  Commonly known as:  LASIX      TAKE these medications        ALPRAZolam 0.25 MG tablet  Commonly known as:  XANAX  Take 1 tablet (0.25 mg total) by mouth 2 (two) times daily as needed for anxiety.     aspirin EC 81 MG tablet  Take 81 mg by mouth daily.     citalopram 10 MG tablet  Commonly known as:  CELEXA  Take 10 mg by mouth at bedtime.     ferrous sulfate 325 (65 FE) MG tablet  Take 325 mg by mouth daily with breakfast.     levothyroxine 88 MCG tablet  Commonly known as:  SYNTHROID, LEVOTHROID  Take 1 tablet (88 mcg total) by mouth daily.     losartan 50 MG tablet  Commonly known as:  COZAAR  Take 50 mg by mouth daily.     magnesium oxide 400 (241.3 Mg) MG tablet  Commonly known as:  MAG-OX  Take 1 tablet (400 mg total) by mouth 2 (two) times daily.     metoprolol 50 MG tablet  Commonly known as:  LOPRESSOR  Take 1 tablet (50 mg total) by mouth 2 (two) times daily.     omeprazole 20 MG capsule  Commonly known as:  PRILOSEC  Take 20 mg by mouth daily.     torsemide 20 MG tablet  Commonly known as:  DEMADEX  Take 2 tablets (40 mg total) by mouth daily.     traMADol 50 MG tablet  Commonly known as:  ULTRAM  Take 1 tablet (50 mg total) by mouth every 12 (twelve) hours as needed.     Vitamin D (Ergocalciferol) 50000 units Caps capsule  Commonly known as:  DRISDOL  Take 1 capsule (50,000 Units total) by mouth every 7  (seven) days.        Discharged Condition: improved    Consults: Nephrology  Significant Diagnostic Studies: Dg Chest 1 View  01/23/2016  CLINICAL DATA:  CHF EXAM: CHEST 1 VIEW COMPARISON:  01/01/2016 FINDINGS: Cardiac shadow is enlarged. Mild vascular congestion is noted. No focal pulmonary edema is seen. No focal infiltrate is noted. No bony abnormality is seen. IMPRESSION: Vascular congestion without significant pulmonary edema. Electronically Signed   By: Alcide Clever M.D.   On: 01/23/2016 16:34   Dg Chest 2 View  12/30/2015  CLINICAL DATA:  Weakness, leg swelling EXAM: CHEST  2 VIEW COMPARISON:  CTA chest dated 07/13/2015 FINDINGS: Cardiomegaly with mild interstitial/perihilar edema. Suspected small right pleural effusion. No pneumothorax. Mild degenerative changes of the visualized thoracolumbar spine. IMPRESSION: Cardiomegaly with mild interstitial edema and suspected small right pleural effusion. Electronically Signed   By: Charline Bills M.D.   On: 12/30/2015 19:23   US Abdomen Complete  01/04/2016  CLINICAL DATA:  Patient with abnormal LFTs.  Prior hysterectomy. EXAM: ABDOMEN ULTRASOUND COMPLETE COMPARISON:  Chest CT 07/13/2015 FINDINGS: Gallbladder: The gallbladder is mildly contracted. Gallbladder  wall thickening upper limits of normal. No pericholecystic fluid. Negative sonographic Murphy's sign. No definite gallstones identified. Common bile duct: Diameter: 2 mm Liver: No focal lesion identified. Within normal limits in parenchymal echogenicity. IVC: No abnormality visualized. Pancreas: Visualized portion unremarkable. Spleen: Size and appearance within normal limits. Right Kidney: Length: 11.0 cm. There is a 1.7 cm cyst within the superior pole. Normal renal cortical thickness echogenicity. No hydronephrosis. Left Kidney: Length: 10.3 cm. Echogenicity within normal limits. No mass or hydronephrosis visualized. Abdominal aorta: No aneurysm visualized. Other findings: None.  IMPRESSION: No acute process within the abdomen. No cholelithiasis or sonographic evidence for acute cholecystitis. No biliary ductal dilatation. Electronically Signed   By: Annia Belt M.D.   On: 01/04/2016 08:48   US Renal  01/23/2016  CLINICAL DATA:  Acute renal failure EXAM: RENAL / URINARY TRACT ULTRASOUND COMPLETE COMPARISON:  01/04/2016 FINDINGS: Right Kidney: Length: 9.7 cm. A parapelvic cyst is noted in the upper pole measuring approximately 1.4 cm in greatest dimension. Left Kidney: Length: 10 cm. A few scattered cysts are noted new. The largest of these measures 1.1 cm in greatest dimension. Bladder: Appears normal for degree of bladder distention. Incidental note is made of a left-sided pleural effusion. IMPRESSION: Bilateral renal cystic change. Electronically Signed   By: Alcide Clever M.D.   On: 01/23/2016 11:08   Dg Chest Port 1 View  01/01/2016  CLINICAL DATA:  82 year old female with weakness and shortness of breath. EXAM: PORTABLE CHEST 1 VIEW COMPARISON:  12/30/2015 and prior exams FINDINGS: This is a mildly low volume film. Cardiomegaly and pulmonary vascular congestion again noted. Elevation of the right hemidiaphragm is unchanged. There is no evidence of focal airspace disease, pulmonary edema, suspicious pulmonary nodule/mass, pleural effusion, or pneumothorax. No acute bony abnormalities are identified. IMPRESSION: Low volume film with cardiomegaly and pulmonary vascular congestion. Electronically Signed   By: Harmon Pier M.D.   On: 01/01/2016 11:11    Lab Results: Basic Metabolic Panel:  Recent Labs  98/11/91 0534 01/29/16 0546  NA 138 136  K 3.5 3.6  CL 102 99*  CO2 24 25  GLUCOSE 101* 90  BUN 54* 47*  CREATININE 2.38* 2.26*  CALCIUM 8.6* 8.0*  PHOS 3.3 3.3   Liver Function Tests:  Recent Labs  01/28/16 0534 01/29/16 0546  ALBUMIN 2.8* 2.8*     CBC:  Recent Labs  01/27/16 0628  WBC 5.8  HGB 13.0  HCT 40.2  MCV 96.4  PLT 174    Recent Results  (from the past 240 hour(s))  Urine culture     Status: None   Collection Time: 01/22/16  2:15 PM  Result Value Ref Range Status   Specimen Description URINE, CATHETERIZED  Final   Special Requests NONE  Final   Culture   Final    >=100,000 COLONIES/mL ESCHERICHIA COLI Performed at [facility]    Report Status 01/25/2016 FINAL  Final   Organism ID, Bacteria ESCHERICHIA COLI  Final      Susceptibility   Escherichia coli - MIC*    AMPICILLIN >=32 RESISTANT Resistant     CEFAZOLIN <=4 SENSITIVE Sensitive     CEFTRIAXONE <=1 SENSITIVE Sensitive     CIPROFLOXACIN >=4 RESISTANT Resistant     GENTAMICIN >=16 RESISTANT Resistant     IMIPENEM <=0.25 SENSITIVE Sensitive     NITROFURANTOIN <=16 SENSITIVE Sensitive     TRIMETH/SULFA <=20 SENSITIVE Sensitive     AMPICILLIN/SULBACTAM 16 INTERMEDIATE Intermediate     PIP/TAZO <=4  SENSITIVE Sensitive     * >=100,000 COLONIES/mL ESCHERICHIA COLI  Urine culture     Status: None (Preliminary result)   Collection Time: 01/26/16  9:02 PM  Result Value Ref Range Status   Specimen Description URINE, CATHETERIZED  Final   Special Requests NONE  Final   Culture   Final    NO GROWTH < 24 HOURS [facility]formed at [facility]    Report Status PENDING  Incomplete     Hospital Course:   This is a 82 years old with history of multiple medical illnesses was admitted from nursing home due to acute on chronic renal failure and UTI. Patient was initially treated with iv hydration. Patient developed worsening edema and her diuretic was adjusted. Patient grow E.coli in her urine and she received 7 days dose of IV Rocephin. Over all patient improved and her renal function improved. On discharge her BUN is 47, and Cr 2.26. After discharge her renal functions needs to be closely monitored and her diuretic adjusted.  Discharge Exam: Blood pressure 100/63, pulse 135, temperature 98 F (36.7 C), temperature source Oral, resp. rate 20, height 5\' 11"  (1.803  m), weight 114.306 kg (252 lb), SpO2 98 %.   Disposition:  Nursing home      Signed: Elle Vezina   01/29/2016, 8:15 AM

## 2016-01-29 NOTE — Progress Notes (Addendum)
Patient is scheduled to return back [facility] today per notes. LCSW sent all clinicals to facility for review. [facility]. [place] and notified of DC as well.  Patient is applying for LTC medicaid. LCSW faxed updated FL2 per request of family and CM for Medicaid.   Awaiting family to arrive (husband) LCSW will send FL2 to case manager at [employer] for LTC medicaid.  Plan: [facility].   Deretha EmoryHannah Maralee Higuchi LCSW, MSW Clinical Social Work: [facility] [employer] [phone]

## 2016-02-01 ENCOUNTER — Encounter (HOSPITAL_COMMUNITY): Payer: Self-pay | Admitting: Emergency Medicine

## 2016-02-01 ENCOUNTER — Non-Acute Institutional Stay (SKILLED_NURSING_FACILITY): Payer: Medicare Other | Admitting: Internal Medicine

## 2016-02-01 ENCOUNTER — Emergency Department (HOSPITAL_COMMUNITY)
Admission: EM | Admit: 2016-02-01 | Discharge: 2016-02-01 | Disposition: A | Discharge status: Home / Self Care | Payer: Medicare Other | Attending: Emergency Medicine | Admitting: Emergency Medicine

## 2016-02-01 ENCOUNTER — Emergency Department (HOSPITAL_COMMUNITY): Payer: Medicare Other

## 2016-02-01 ENCOUNTER — Encounter: Payer: Self-pay | Admitting: Internal Medicine

## 2016-02-01 DIAGNOSIS — F329 Major depressive disorder, single episode, unspecified: Secondary | ICD-10-CM | POA: Insufficient documentation

## 2016-02-01 DIAGNOSIS — I471 Supraventricular tachycardia: Secondary | ICD-10-CM

## 2016-02-01 DIAGNOSIS — I482 Chronic atrial fibrillation, unspecified: Secondary | ICD-10-CM

## 2016-02-01 DIAGNOSIS — I9589 Other hypotension: Secondary | ICD-10-CM

## 2016-02-01 DIAGNOSIS — Z79899 Other long term (current) drug therapy: Secondary | ICD-10-CM | POA: Insufficient documentation

## 2016-02-01 DIAGNOSIS — I10 Essential (primary) hypertension: Secondary | ICD-10-CM | POA: Diagnosis not present

## 2016-02-01 DIAGNOSIS — I4891 Unspecified atrial fibrillation: Secondary | ICD-10-CM | POA: Diagnosis not present

## 2016-02-01 DIAGNOSIS — I959 Hypotension, unspecified: Secondary | ICD-10-CM | POA: Diagnosis not present

## 2016-02-01 DIAGNOSIS — Z7982 Long term (current) use of aspirin: Secondary | ICD-10-CM | POA: Diagnosis not present

## 2016-02-01 DIAGNOSIS — M1990 Unspecified osteoarthritis, unspecified site: Secondary | ICD-10-CM | POA: Diagnosis not present

## 2016-02-01 HISTORY — DX: Dysphagia, unspecified: R13.10

## 2016-02-01 HISTORY — DX: Unspecified atrial fibrillation: I48.91

## 2016-02-01 HISTORY — DX: Generalized anxiety disorder: F41.1

## 2016-02-01 HISTORY — DX: Disorder of thyroid, unspecified: E07.9

## 2016-02-01 LAB — BASIC METABOLIC PANEL
ANION GAP: 12 (ref 5–15)
BUN: 58 mg/dL — AB (ref 6–20)
CHLORIDE: 92 mmol/L — AB (ref 101–111)
CO2: 25 mmol/L (ref 22–32)
CREATININE: 2.88 mg/dL — AB (ref 0.44–1.00)
Calcium: 7.7 mg/dL — ABNORMAL LOW (ref 8.9–10.3)
GFR calc non Af Amer: 13 mL/min — ABNORMAL LOW (ref >60)
GFR, EST AFRICAN AMERICAN: 15 mL/min — AB (ref >60)
Glucose, Bld: 120 mg/dL — ABNORMAL HIGH (ref 65–99)
Potassium: 3.3 mmol/L — ABNORMAL LOW (ref 3.5–5.1)
SODIUM: 129 mmol/L — AB (ref 135–145)

## 2016-02-01 LAB — CBC WITH DIFFERENTIAL/PLATELET
BASOS ABS: 0 10*3/uL (ref 0.0–0.1)
BASOS PCT: 1 %
EOS ABS: 0.1 10*3/uL (ref 0.0–0.7)
Eosinophils Relative: 1 %
HCT: 36.5 % (ref 36.0–46.0)
Hemoglobin: 12 g/dL (ref 12.0–15.0)
Lymphocytes Relative: 15 %
Lymphs Abs: 1.1 10*3/uL (ref 0.7–4.0)
MCH: 31.6 pg (ref 26.0–34.0)
MCHC: 32.9 g/dL (ref 30.0–36.0)
MCV: 96.1 fL (ref 78.0–100.0)
MONO ABS: 0.9 10*3/uL (ref 0.1–1.0)
MONOS PCT: 12 %
NEUTROS PCT: 71 %
Neutro Abs: 5.3 10*3/uL (ref 1.7–7.7)
Platelets: 133 10*3/uL — ABNORMAL LOW (ref 150–400)
RBC: 3.8 MIL/uL — ABNORMAL LOW (ref 3.87–5.11)
RDW: 18.1 % — AB (ref 11.5–15.5)
WBC: 7.4 10*3/uL (ref 4.0–10.5)

## 2016-02-01 LAB — URINALYSIS, ROUTINE W REFLEX MICROSCOPIC
Bilirubin Urine: NEGATIVE
Glucose, UA: NEGATIVE mg/dL
HGB URINE DIPSTICK: NEGATIVE
Ketones, ur: NEGATIVE mg/dL
Leukocytes, UA: NEGATIVE
Nitrite: NEGATIVE
PH: 5 (ref 5.0–8.0)
Protein, ur: NEGATIVE mg/dL
SPECIFIC GRAVITY, URINE: 1.015 (ref 1.005–1.030)

## 2016-02-01 LAB — TROPONIN I: Troponin I: 0.03 ng/mL (ref <0.031)

## 2016-02-01 MED ORDER — SODIUM CHLORIDE 0.9 % IV BOLUS (SEPSIS)
500.0000 mL | Freq: Once | INTRAVENOUS | Status: AC
  Administered 2016-02-01: 500 mL via INTRAVENOUS

## 2016-02-01 MED ORDER — LORAZEPAM 2 MG/ML IJ SOLN
0.5000 mg | Freq: Once | INTRAMUSCULAR | Status: AC
  Administered 2016-02-01: 0.5 mg via INTRAVENOUS
  Filled 2016-02-01: qty 1

## 2016-02-01 NOTE — ED Notes (Signed)
[facility] representative reports hypotension and tachycardia x1 hour.  Pt has hx a-fib with no symptoms.

## 2016-02-01 NOTE — ED Provider Notes (Signed)
CSN: [id number]     Arrival date & time 02/01/16  1151 History   First MD Initiated Contact with Patient 02/01/16 1205     Chief Complaint  Patient presents with  . Tachycardia     (Consider location/radiation/quality/duration/timing/severity/associated sxs/prior Treatment) HPI.....Marland Kitchen. level V caveat for dementia.  [facility] for tachycardia and hypotension. She has no specific complaints. Specifically, no chest pain, dyspnea, dysuria, fever, sweats, chills, abdominal pain, stiff neck.  Past Medical History  Diagnosis Date  . Hypertension   . High cholesterol   . GERD (gastroesophageal reflux disease)   . Arthritis   . Anxiety   . Depression   . Cataract   . Hearing reduced   . PONV (postoperative nausea and vomiting)   . H/O left breast biopsy     NO CANCER  . Low back pain   . Chronic right hip pain   . Atrial fibrillation (HCC)   . Thyroid disease   . GAD (generalized anxiety disorder)   . Dysphagia    Past Surgical History  Procedure Laterality Date  . Total knee arthroplasty  2008    left, [facility]  . Elbow surgery      left  . Abdominal hysterectomy    . Knee arthroscopy      right  . Breast lumpectomy  1998    left  . Cataract extraction w/phaco  03/23/2012    Procedure: CATARACT EXTRACTION PHACO AND INTRAOCULAR LENS PLACEMENT (IOC);  Surgeon: [name], MD;  Location: [facility];  Service: Ophthalmology;  Laterality: Right;  CDE:12.73  . Cataract extraction w/phaco  04/20/2012    Procedure: CATARACT EXTRACTION PHACO AND INTRAOCULAR LENS PLACEMENT (IOC);  Surgeon: [name], MD;  Location: [facility];  Service: Ophthalmology;  Laterality: Left;  CDE:7.70  . Yag laser application Left 11/07/2014    Procedure: YAG LASER APPLICATION;  Surgeon: [name], MD;  Location: [facility];  Service: Ophthalmology;  Laterality: Left;  left  . Yag laser application Right 11/21/2014    Procedure: YAG LASER APPLICATION;  Surgeon: [name], MD;   Location: [facility];  Service: Ophthalmology;  Laterality: Right;  right   Family History  Problem Relation Age of Onset  . Colon cancer Neg Hx   . Liver disease Neg Hx    Social History  Substance Use Topics  . Smoking status: Never Smoker   . Smokeless tobacco: None  . Alcohol Use: No   OB History    No data available     Review of Systems  Reason unable to perform ROS: Dementia.      Allergies  Review of patient's allergies indicates no known allergies.  Home Medications   Prior to Admission medications   Medication Sig Start Date End Date Taking? Authorizing Provider  ALPRAZolam (XANAX) 0.25 MG tablet Take 1 tablet (0.25 mg total) by mouth 2 (two) times daily as needed for anxiety. 01/29/16  Yes Avon Gullyesfaye Fanta, MD  aspirin EC 81 MG tablet Take 81 mg by mouth daily.   Yes Historical Provider, MD  citalopram (CELEXA) 10 MG tablet Take 10 mg by mouth at bedtime.  09/15/14  Yes Historical Provider, MD  ENSURE (ENSURE) Take 237 mLs by mouth daily.   Yes Historical Provider, MD  ferrous sulfate 325 (65 FE) MG tablet Take 325 mg by mouth daily with breakfast.   Yes Historical Provider, MD  levothyroxine (SYNTHROID, LEVOTHROID) 88 MCG tablet Take 1 tablet (88 mcg total) by  mouth daily. 01/17/16  Yes Pecola Lawless, MD  losartan (COZAAR) 50 MG tablet Take 50 mg by mouth daily.   Yes Historical Provider, MD  magnesium oxide (MAG-OX) 400 (241.3 Mg) MG tablet Take 1 tablet (400 mg total) by mouth 2 (two) times daily. 01/08/16  Yes Avon Gully, MD  Metoprolol Tartrate 75 MG TABS Take 1 tablet by mouth twice a day   Yes Historical Provider, MD  omeprazole (PRILOSEC) 20 MG capsule Take 20 mg by mouth daily. 10/07/14  Yes Historical Provider, MD  torsemide (DEMADEX) 20 MG tablet Take 2 tablets (40 mg total) by mouth daily. 01/29/16  Yes Avon Gully, MD  traMADol (ULTRAM) 50 MG tablet Take 1 tablet (50 mg total) by mouth every 12 (twelve) hours as needed. 01/29/16  Yes Avon Gully, MD   Vitamin D, Ergocalciferol, (DRISDOL) 50000 units CAPS capsule Take 1 capsule (50,000 Units total) by mouth every 7 (seven) days. 01/29/16  Yes Avon Gully, MD   BP 93/69 mmHg  Pulse 129  Temp(Src) 97.9 F (36.6 C) (Oral)  Resp 17  Wt 252 lb (114.306 kg)  SpO2 99% Physical Exam  Constitutional: She appears well-developed and well-nourished.  HENT:  Head: Normocephalic and atraumatic.  Eyes: Conjunctivae and EOM are normal. Pupils are equal, round, and reactive to light.  Neck: Normal range of motion. Neck supple.  Cardiovascular:  Irregular  Pulmonary/Chest: Effort normal and breath sounds normal.  Abdominal: Soft. Bowel sounds are normal.  Musculoskeletal: Normal range of motion.  Neurological: She is alert.  Skin: Skin is warm and dry.  Psychiatric:  Pleasant, demented  Nursing note and vitals reviewed.   ED Course  Procedures (including critical care time) Labs Review Labs Reviewed  CBC WITH DIFFERENTIAL/PLATELET - Abnormal; Notable for the following:    RBC 3.80 (*)    RDW 18.1 (*)    Platelets 133 (*)    All other components within normal limits  BASIC METABOLIC PANEL - Abnormal; Notable for the following:    Sodium 129 (*)    Potassium 3.3 (*)    Chloride 92 (*)    Glucose, Bld 120 (*)    BUN 58 (*)    Creatinine, Ser 2.88 (*)    Calcium 7.7 (*)    GFR calc non Af Amer 13 (*)    GFR calc Af Amer 15 (*)    All other components within normal limits  URINALYSIS, ROUTINE W REFLEX MICROSCOPIC (NOT AT [facility])  TROPONIN I    Imaging Review Dg Chest Port 1 View  02/01/2016  CLINICAL DATA:  Low blood pressure EXAM: PORTABLE CHEST 1 VIEW COMPARISON:  01/23/2016 FINDINGS: Cardiac shadow remains mildly enlarged. Mild central vascular prominence is seen without focal edema. No focal infiltrate is noted. No bony abnormality is seen. IMPRESSION: Mild central vascular prominence similar to that seen on the prior exam. No new focal abnormality is noted. Electronically Signed    By: Alcide Clever M.D.   On: 02/01/2016 13:41   I have personally reviewed and evaluated these images and lab results as part of my medical decision-making.   EKG Interpretation   Date/Time:  Friday February 01 2016 11:59:01 EDT Ventricular Rate:  121 PR Interval:    QRS Duration: 158 QT Interval:  381 QTC Calculation: 541 R Axis:   -83 Text Interpretation:  Atrial flutter with 2:1 AV block Left bundle branch  block Confirmed by Vivian Okelley  MD, Layton Tappan ([id number]) on 02/01/2016 12:59:58 PM Also  confirmed by Adriana Simas  MD, Arlys John ([id number])  on 02/01/2016 7:23:10 PM      MDM   Final diagnoses:  Atrial fibrillation with rapid ventricular response (HCC)    Patient is nontoxic-appearing. Blood pressure has been within her norm. White count normal. Urinalysis negative. Chest x-ray shows no pneumonia. EKG shows questionable atrial flutter which is not new.    Donnetta Hutching, MD 02/01/16 2020

## 2016-02-01 NOTE — Progress Notes (Signed)
Patient ID: Michaela Tyler, female   DOB: 07-Sep-1921, 82 y.o.   MRN: [id number]  Location:  [facility]   Place of Service:  SNF (31)  Avon Gully, MD  Patient Care Team: Avon Gully, MD as PCP - General (Internal Medicine)  Extended Emergency Contact Information Primary Emergency Contact: [name] [place] Mobile Phone: [phone] Relation: Niece Secondary Emergency Contact: Janowiak,Carroll  [place] Mobile Phone: [phone] Relation: Spouse   Goals of care: Advanced Directive information Advanced Directives 02/01/2016  Does patient have an advance directive? Yes  Type of Advance Directive Out of facility DNR (pink MOST or yellow form)  Does patient want to make changes to advanced directive? No - Patient declined  Copy of advanced directive(s) in chart? Yes     Chief Complaint  Patient presents with  . Acute Visit  Acute visit secondary to tachycardia-hypotension-increased weakness    HPI:  Pt is a 82 y.o. female seen today for an acute visit for Tachycardia with hypotension and increased weakness.  Patient has a complicated medical history including recent hospitalization for acute on chronic renal insufficiency with a creatinine that was up to 4.88 BUN of 97 this did improve with hydration however apparently this complicated her CHF as well. And required diuresis. She has a history of chronic systolic and diastolic CHF  She also has a history of atrial fibrillation.  Currently on metoprolol--she does have a history of rapid ventricular response and apparently her pulse was 120 at one point today-blood pressure was noted to be 83/48-when I checked her pulse it was running around 110-120 at times.  Patient is complaining of feeling increasingly weak although does not complain of any shortness of breath or chest pain otherwise vital signs appear to be stable.     Past Medical History  Diagnosis Date  . Hypertension    . High cholesterol   . GERD (gastroesophageal reflux disease)   . Arthritis   . Anxiety   . Depression   . Cataract   . Hearing reduced   . PONV (postoperative nausea and vomiting)   . H/O left breast biopsy     NO CANCER  . Low back pain   . Chronic right hip pain    Past Surgical History  Procedure Laterality Date  . Total knee arthroplasty  2008    left, [facility]  . Elbow surgery      left  . Abdominal hysterectomy    . Knee arthroscopy      right  . Breast lumpectomy  1998    left  . Cataract extraction w/phaco  03/23/2012    Procedure: CATARACT EXTRACTION PHACO AND INTRAOCULAR LENS PLACEMENT (IOC);  Surgeon: Loraine Leriche T. Nile Riggs, MD;  Location: [facility];  Service: Ophthalmology;  Laterality: Right;  CDE:12.73  . Cataract extraction w/phaco  04/20/2012    Procedure: CATARACT EXTRACTION PHACO AND INTRAOCULAR LENS PLACEMENT (IOC);  Surgeon: Loraine Leriche T. Nile Riggs, MD;  Location: [facility];  Service: Ophthalmology;  Laterality: Left;  CDE:7.70  . Yag laser application Left 11/07/2014    Procedure: YAG LASER APPLICATION;  Surgeon: Loraine Leriche T. Nile Riggs, MD;  Location: [facility];  Service: Ophthalmology;  Laterality: Left;  left  . Yag laser application Right 11/21/2014    Procedure: YAG LASER APPLICATION;  Surgeon: Loraine Leriche T. Nile Riggs, MD;  Location: [facility];  Service: Ophthalmology;  Laterality: Right;  right    No Known Allergies Family medical social history reviewed per admission history and physical on  01/14/2016-family history negative for liver colon cancer.  Social history significant for no history of alcohol tobacco or illicit drug use.  Medications have been reviewed per [facility]  Aspirin 81 mg daily.  Celexa 10 mg daily at bedtime.  Vitamin D 50,000 units every weekly.  Ferrous sulfate 325 mg daily.  Synthroid 88 g daily.  Valsartan 50 mg daily.  Pain oxide 400 mg twice a day.  Lopressor 75 mg twice a day.  Prilosec 20 mg daily.  Demadex 40 mg every morning.  Xanax 0.25 mg twice a day  when necessary.  Tramadol 50 mg every 12 hours when necessary.       Review of Systems   Provided by patient and nurse patient is a somewhat poor historian.  In general does not complain of fever chills.  Skin is not complaining of diaphoresis rashes or itching.  Head ears eyes nose mouth and throat does not complain of visual changes or sore throat or nasal discharge.  Respiratory no shortness of breath or cough noted.  Cardiac does not complain of chest pain does have baseline lower extremity edema.  GI is not complaining of nausea or vomiting or abdominal discomfort.  GU no specific complaints of dysuria.  Musculoskeletal has weakness but does not complaining of joint pain.  Neurologic is not complaining of headache apparently at times some dizziness.  Psych some history of it appears confusion at times.    Immunization History  Administered Date(s) Administered  . Tdap 02/07/2012   Pertinent  Health Maintenance Due  Topic Date Due  . DEXA SCAN  03/07/1988  . PNA vac Low Risk Adult (1 of 2 - PCV13) 03/07/1988  . INFLUENZA VACCINE  05/27/2016   No flowsheet data found. Functional Status Survey:    Filed Vitals:   02/01/16 1031  BP: 83/48  Pulse: 110  Temp: 97 F (36.1 C)  TempSrc: Oral  Resp: 20  Height:  (1.803 m)  Weight: 253 lb 9.6 oz (115.032 kg)  SpO2: 94%   Body mass index is 35.39 kg/(m^2). Physical Exam   In general this is a somewhat obese LE female in no acute distress but appears quite frail and weak.  Her skin is warm and dry.  Eyes pupils appear to be reactive to light sclerae are clear visual acuity appears grossly intact.  Oropharynx is clear.  Chest she has some scattered coarse breath sounds there is no labored breathing.  Heart is irregular irregular rate and rhythm with rate varying between 110-120 each minute she has her baseline lower extremity edema I would say approximately 2+.  Her abdomen is obese soft  nontender positive bowel sounds.  Muscle skeletal has generalized weakness is able to move all extremities 4.  Neurologic grossly intact her speech is clear no lateralizing findings.  Psych she is oriented to self appears to be quite weak and anxious.  Responds to simple verbal commands appropriately.    Labs reviewed:  Recent Labs  12/31/15 1012  01/23/16 0625  01/27/16 0628 01/28/16 0534 01/29/16 01/29/16 0546  NA  --   < > 135  < > 138 138 136* 136  K  --   < > 5.0  < > 4.3 3.5  --  3.6  CL  --   < > 105  < > 102 102  --  99*  CO2  --   < > 19*  < > 24 24  --  25  GLUCOSE  --   < >  90  < > 100* 101*  --  90  BUN  --   < > 96*  < > 61* 54* 47* 47*  CREATININE  --   < > 4.70*  < > 2.75* 2.38* 2.3* 2.26*  CALCIUM  --   < > 8.0*  < > 8.6* 8.6*  --  8.0*  MG 1.4*  --  2.8*  --   --   --   --   --   PHOS  --   --   --   < > 3.3 3.3  --  3.3  < > = values in this interval not displayed.  Recent Labs  01/24/16 [phone] 01/25/16 0616 01/26/16 0733 01/27/16 0628 01/28/16 0534 01/29/16 0546  AST 30 26 25   --   --   --   ALT 34 29 28  --   --   --   ALKPHOS 149* 134* 138*  --   --   --   BILITOT 1.7* 1.7* 1.6*  --   --   --   PROT 6.1* 5.8* 5.9*  --   --   --   ALBUMIN 3.1* 2.9* 2.9* 3.0* 2.8* 2.8*    Recent Labs  07/13/15 1520 12/30/15 1830  01/22/16 1235 01/23/16 0625 01/27/16 01/27/16 0628  WBC 5.2 5.7  < > 6.4 5.8 5.6 5.8  NEUTROABS 3.9 3.9  --  4.1  --   --   --   HGB 10.2* 12.4  < > 12.9 12.6  --  13.0  HCT 32.0* 38.3  < > 39.7 38.4  --  40.2  MCV 91.2 94.3  < > 94.7 94.6  --  96.4  PLT 180 154  < > 161 148*  --  174  < > = values in this interval not displayed. Lab Results  Component Value Date   TSH 6.674* 12/31/2015   No results found for: HGBA1C No results found for: CHOL, HDL, LDLCALC, LDLDIRECT, TRIG, CHOLHDL  Significant Diagnostic Results in last 30 days:  Dg Chest 1 View  01/23/2016  CLINICAL DATA:  CHF EXAM: CHEST 1 VIEW COMPARISON:   01/01/2016 FINDINGS: Cardiac shadow is enlarged. Mild vascular congestion is noted. No focal pulmonary edema is seen. No focal infiltrate is noted. No bony abnormality is seen. IMPRESSION: Vascular congestion without significant pulmonary edema. Electronically Signed   By: Alcide CleverMark  Lukens M.D.   On: 01/23/2016 16:34   [place] Abdomen Complete  01/04/2016  CLINICAL DATA:  Patient with abnormal LFTs.  Prior hysterectomy. EXAM: ABDOMEN ULTRASOUND COMPLETE COMPARISON:  Chest CT 07/13/2015 FINDINGS: Gallbladder: The gallbladder is mildly contracted. Gallbladder wall thickening upper limits of normal. No pericholecystic fluid. Negative sonographic Murphy's sign. No definite gallstones identified. Common bile duct: Diameter: 2 mm Liver: No focal lesion identified. Within normal limits in parenchymal echogenicity. IVC: No abnormality visualized. Pancreas: Visualized portion unremarkable. Spleen: Size and appearance within normal limits. Right Kidney: Length: 11.0 cm. There is a 1.7 cm cyst within the superior pole. Normal renal cortical thickness echogenicity. No hydronephrosis. Left Kidney: Length: 10.3 cm. Echogenicity within normal limits. No mass or hydronephrosis visualized. Abdominal aorta: No aneurysm visualized. Other findings: None. IMPRESSION: No acute process within the abdomen. No cholelithiasis or sonographic evidence for acute cholecystitis. No biliary ductal dilatation. Electronically Signed   By: Annia Beltrew  Davis M.D.   On: 01/04/2016 08:48   [place] Renal  01/23/2016  CLINICAL DATA:  Acute renal failure EXAM: RENAL / URINARY TRACT ULTRASOUND COMPLETE COMPARISON:  01/04/2016 FINDINGS: Right Kidney: Length: 9.7 cm. A parapelvic cyst is noted in the upper pole measuring approximately 1.4 cm in greatest dimension. Left Kidney: Length: 10 cm. A few scattered cysts are noted new. The largest of these measures 1.1 cm in greatest dimension. Bladder: Appears normal for degree of bladder distention. Incidental note is made of a  left-sided pleural effusion. IMPRESSION: Bilateral renal cystic change. Electronically Signed   By: Alcide Clever M.D.   On: 01/23/2016 11:08     Assessment/Plan  Tachycardia with hypotension-concern here for an acute type process-clinically she does not appear to be unstable but pulse rate is concerning in light of hypotension as well-will send her to the ER for expedient evaluation--per serial exams patient appeared to be stable with again pulse rate ranging between 110 and 120 with  the concerning low blood pressure as well.   [id number]

## 2016-02-01 NOTE — Discharge Instructions (Signed)
Tests show no life-threatening condition. Eat regular meals. Follow-up your primary care doctor.

## 2016-02-01 NOTE — ED Notes (Addendum)
Attempted to in and out cath pt with no urine return.

## 2016-02-01 NOTE — ED Notes (Signed)
Dr Cook at bedside

## 2016-02-03 DIAGNOSIS — I471 Supraventricular tachycardia, unspecified: Secondary | ICD-10-CM | POA: Insufficient documentation

## 2016-02-03 DIAGNOSIS — I959 Hypotension, unspecified: Secondary | ICD-10-CM | POA: Insufficient documentation

## 2016-02-03 NOTE — Progress Notes (Signed)
Patient ID: Michaela Tyler, female   DOB: 09/21/1921, 82 y.o.   MRN: [id number]  

## 2016-02-05 ENCOUNTER — Non-Acute Institutional Stay (SKILLED_NURSING_FACILITY): Payer: Medicare Other | Admitting: Internal Medicine

## 2016-02-05 ENCOUNTER — Encounter (HOSPITAL_COMMUNITY)
Admission: AD | Admit: 2016-02-05 | Discharge: 2016-02-05 | Disposition: A | Discharge status: Home / Self Care | Payer: Medicare Other | Source: Skilled Nursing Facility | Attending: Internal Medicine | Admitting: Internal Medicine

## 2016-02-05 ENCOUNTER — Encounter: Payer: Self-pay | Admitting: Internal Medicine

## 2016-02-05 DIAGNOSIS — I4891 Unspecified atrial fibrillation: Secondary | ICD-10-CM

## 2016-02-05 DIAGNOSIS — I10 Essential (primary) hypertension: Secondary | ICD-10-CM | POA: Insufficient documentation

## 2016-02-05 DIAGNOSIS — I959 Hypotension, unspecified: Secondary | ICD-10-CM | POA: Diagnosis not present

## 2016-02-05 DIAGNOSIS — F322 Major depressive disorder, single episode, severe without psychotic features: Secondary | ICD-10-CM | POA: Insufficient documentation

## 2016-02-05 DIAGNOSIS — M6281 Muscle weakness (generalized): Secondary | ICD-10-CM | POA: Insufficient documentation

## 2016-02-05 DIAGNOSIS — F411 Generalized anxiety disorder: Secondary | ICD-10-CM | POA: Insufficient documentation

## 2016-02-05 DIAGNOSIS — E785 Hyperlipidemia, unspecified: Secondary | ICD-10-CM | POA: Insufficient documentation

## 2016-02-05 DIAGNOSIS — I509 Heart failure, unspecified: Secondary | ICD-10-CM

## 2016-02-05 DIAGNOSIS — I5023 Acute on chronic systolic (congestive) heart failure: Secondary | ICD-10-CM | POA: Insufficient documentation

## 2016-02-05 LAB — BASIC METABOLIC PANEL
ANION GAP: 13 (ref 5–15)
BUN: 52 mg/dL — ABNORMAL HIGH (ref 6–20)
CALCIUM: 7.7 mg/dL — AB (ref 8.9–10.3)
CHLORIDE: 95 mmol/L — AB (ref 101–111)
CO2: 26 mmol/L (ref 22–32)
Creatinine, Ser: 2.5 mg/dL — ABNORMAL HIGH (ref 0.44–1.00)
GFR calc Af Amer: 18 mL/min — ABNORMAL LOW (ref >60)
GFR calc non Af Amer: 16 mL/min — ABNORMAL LOW (ref >60)
GLUCOSE: 93 mg/dL (ref 65–99)
Potassium: 3.1 mmol/L — ABNORMAL LOW (ref 3.5–5.1)
Sodium: 134 mmol/L — ABNORMAL LOW (ref 135–145)

## 2016-02-05 NOTE — Progress Notes (Signed)
Patient ID: Michaela Tyler, female   DOB: [birth year], 82 y.o.   MRN: [id number]  Location:  [facility]   Place of Service:  SNF (31)   Avon GullyFANTA,TESFAYE, MD  Patient Care Team: Avon Gullyesfaye Fanta, MD as PCP - General (Internal Medicine)  Extended Emergency Contact Information Primary Emergency Contact: Carron Curieain,[name]  [place] Mobile Phone: [phone] Relation: Niece Secondary Emergency Contact: Grosz,Carroll  [place] Mobile Phone: [phone] Relation: Spouse  Goals of care: Advanced Directive information Advanced Directives 02/05/2016  Does patient have an advance directive? Yes  Type of Advance Directive Out of facility DNR (pink MOST or yellow form)  Does patient want to make changes to advanced directive? No - Patient declined  Copy of advanced directive(s) in chart? Yes     Chief Complaint  Patient presents with  . Acute Visit   Secondary to hypotension-with history of atrial flutter-also hypokalemia HPI:  Pt is a 82 y.o. female seen today for an acute visit for above-stated issues.  She is able on herbal elderly resident with a comp acute medical history including recent hospitalization for acute on chronic renal insufficiency with a creatinine that was up to 4.88 BUN of 97 this improved with hydration.  This is complicated with her CHF.  She has a history of chronic combined diastolic and systolic CHF.  She does have a history of rapid ventricular response-she is currently on Lopressor 75 mg twice a day-she is also on vLosartan 50 mg a day her blood pressure continues to run low which is not entirely unusual for her recently noted 93/68 98/58  She appears to be asymptomatic at this time.  She also had labs done on April 7 which shows a mildly low potassium of 3.3 her BUN is 58 at creatinine 2.88 on that lab.  Currently she has no complaints she has she appears to be a bit stronger than when a saw her previously she is a poor  historian however secondary to some dementia    Past Medical History  Diagnosis Date  . Hypertension   . High cholesterol   . GERD (gastroesophageal reflux disease)   . Arthritis   . Anxiety   . Depression   . Cataract   . Hearing reduced   . PONV (postoperative nausea and vomiting)   . H/O left breast biopsy     NO CANCER  . Low back pain   . Chronic right hip pain   . Atrial fibrillation (HCC)   . Thyroid disease   . GAD (generalized anxiety disorder)   . Dysphagia    Past Surgical History  Procedure Laterality Date  . Total knee arthroplasty  2008    left, [facility]  . Elbow surgery      left  . Abdominal hysterectomy    . Knee arthroscopy      right  . Breast lumpectomy  1998    left  . Cataract extraction w/phaco  03/23/2012    Procedure: CATARACT EXTRACTION PHACO AND INTRAOCULAR LENS PLACEMENT (IOC);  Surgeon: [name], MD;  Location: [facility];  Service: Ophthalmology;  Laterality: Right;  CDE:12.73  . Cataract extraction w/phaco  04/20/2012    Procedure: CATARACT EXTRACTION PHACO AND INTRAOCULAR LENS PLACEMENT (IOC);  Surgeon: [name], MD;  Location: [facility];  Service: Ophthalmology;  Laterality: Left;  CDE:7.70  . Yag laser application Left 11/07/2014    Procedure: YAG LASER APPLICATION;  Surgeon: [name], MD;  Location: [facility];  Service: Ophthalmology;  Laterality: Left;  left  . Yag laser application Right 11/21/2014    Procedure: YAG LASER APPLICATION;  Surgeon: Loraine Leriche T. Nile Riggs, MD;  Location: [facility];  Service: Ophthalmology;  Laterality: Right;  right    No Known Allergies    Medication List       This list is accurate as of: 02/05/16  1:41 PM.  Always use your most recent med list.               ALPRAZolam 0.25 MG tablet  Commonly known as:  XANAX  Take 1 tablet (0.25 mg total) by mouth 2 (two) times daily as needed for anxiety.     aspirin EC 81 MG tablet  Take 81 mg by mouth daily.     citalopram 10 MG tablet  Commonly known as:   CELEXA  Take 10 mg by mouth at bedtime.     ENSURE  Take 237 mLs by mouth daily.     ferrous sulfate 325 (65 FE) MG tablet  Take 325 mg by mouth daily with breakfast.     levothyroxine 88 MCG tablet  Commonly known as:  SYNTHROID, LEVOTHROID  Take 1 tablet (88 mcg total) by mouth daily.     losartan 50 MG tablet  Commonly known as:  COZAAR  Take 50 mg by mouth daily.     magnesium oxide 400 (241.3 Mg) MG tablet  Commonly known as:  MAG-OX  Take 1 tablet (400 mg total) by mouth 2 (two) times daily.     Metoprolol Tartrate 75 MG Tabs  Take 1 tablet by mouth twice a day     omeprazole 20 MG capsule  Commonly known as:  PRILOSEC  Take 20 mg by mouth daily.     torsemide 20 MG tablet  Commonly known as:  DEMADEX  Take 2 tablets (40 mg total) by mouth daily.     traMADol 50 MG tablet  Commonly known as:  ULTRAM  Take 1 tablet (50 mg total) by mouth every 12 (twelve) hours as needed.     Vitamin D (Ergocalciferol) 50000 units Caps capsule  Commonly known as:  DRISDOL  Take 1 capsule (50,000 Units total) by mouth every 7 (seven) days.        Review of Systems--very limited secondary to dementia.  Please see history of present illness she is currently denying any shortness of breath or chest pain appears to be a bit stronger than when I saw her previously weighed at 238 actually appears down about 12 pounds since March 16  Immunization History  Administered Date(s) Administered  . Tdap 02/07/2012   Pertinent  Health Maintenance Due  Topic Date Due  . DEXA SCAN  03/07/1988  . PNA vac Low Risk Adult (1 of 2 - PCV13) 03/07/1988  . INFLUENZA VACCINE  05/27/2016   No flowsheet data found. Functional Status Survey:    Filed Vitals:   02/05/16 1322  BP: 98/54  Pulse: 90  Temp: 97.5 F (36.4 C)  TempSrc: Oral  Resp: 18  Height:  (1.803 m)  Weight: 238 lb 14.4 oz (108.364 kg)  SpO2: 94%   Body mass index is 33.33 kg/(m^2). Physical Exam   In general  this is a frail elderly female in no distress resting comfortable be in bed.  Her skin is warm and dry.  Eyes pupils appear reactive to light.  Oropharynx clear mucous membranes appear fairly moist.  Chest she has shallow air entry  with bronchial sounds there is no labored breathing.  Heart is irregular irregular rate and rhythm without murmur gallop or rub she has baseline I would say 2+ lower extremity edema area  Abdomen is obese soft nontender positive bowel sounds.  Muscle skeletal has generalized weakness but upper extremity strength appears preserved continues with lower extremity weakness which does not appear to be changed from baseline.  Neurologic cannot really appreciate any lateralizing findings cranial nerves appear grossly intact she has generalized weakness especially lower extremities.  Psych she is oriented to self is pleasant and appropriate follows verbal commands without difficulty  Labs reviewed:  Recent Labs  12/31/15 1012  01/23/16 0625  01/27/16 0628 01/28/16 0534 01/29/16 01/29/16 0546 02/01/16 1322  NA  --   < > 135  < > 138 138 136* 136 129*  K  --   < > 5.0  < > 4.3 3.5  --  3.6 3.3*  CL  --   < > 105  < > 102 102  --  99* 92*  CO2  --   < > 19*  < > 24 24  --  25 25  GLUCOSE  --   < > 90  < > 100* 101*  --  90 120*  BUN  --   < > 96*  < > 61* 54* 47* 47* 58*  CREATININE  --   < > 4.70*  < > 2.75* 2.38* 2.3* 2.26* 2.88*  CALCIUM  --   < > 8.0*  < > 8.6* 8.6*  --  8.0* 7.7*  MG 1.4*  --  2.8*  --   --   --   --   --   --   PHOS  --   --   --   < > 3.3 3.3  --  3.3  --   < > = values in this interval not displayed.  Recent Labs  01/24/16 [phone] 01/25/16 0616 01/26/16 0733 01/27/16 0628 01/28/16 0534 01/29/16 0546  AST --   --   --   ALT 34 29 28  --   --   --   ALKPHOS 149* 134* 138*  --   --   --   BILITOT 1.7* 1.7* 1.6*  --   --   --   PROT 6.1* 5.8* 5.9*  --   --   --   ALBUMIN 3.1* 2.9* 2.9* 3.0* 2.8* 2.8*    Recent  Labs  12/30/15 1830  01/22/16 1235 01/23/16 0625 01/27/16 01/27/16 0628 02/01/16 1322  WBC 5.7  < > 6.4 5.8 5.6 5.8 7.4  NEUTROABS 3.9  --  4.1  --   --   --  5.3  HGB 12.4  < > 12.9 12.6  --  13.0 12.0  HCT 38.3  < > 39.7 38.4  --  40.2 36.5  MCV 94.3  < > 94.7 94.6  --  96.4 96.1  PLT 154  < > 161 148*  --  174 133*  < > = values in this interval not displayed. Lab Results  Component Value Date   TSH 6.674* 12/31/2015   No results found for: HGBA1C No results found for: CHOL, HDL, LDLCALC, LDLDIRECT, TRIG, CHOLHDL  Significant Diagnostic Results in last 30 days:  Dg Chest 1 View  01/23/2016  CLINICAL DATA:  CHF EXAM: CHEST 1 VIEW COMPARISON:  01/01/2016 FINDINGS: Cardiac shadow is enlarged. Mild vascular congestion is noted. No focal pulmonary  edema is seen. No focal infiltrate is noted. No bony abnormality is seen. IMPRESSION: Vascular congestion without significant pulmonary edema. Electronically Signed   By: Alcide Clever M.D.   On: 01/23/2016 16:34   US Renal  01/23/2016  CLINICAL DATA:  Acute renal failure EXAM: RENAL / URINARY TRACT ULTRASOUND COMPLETE COMPARISON:  01/04/2016 FINDINGS: Right Kidney: Length: 9.7 cm. A parapelvic cyst is noted in the upper pole measuring approximately 1.4 cm in greatest dimension. Left Kidney: Length: 10 cm. A few scattered cysts are noted new. The largest of these measures 1.1 cm in greatest dimension. Bladder: Appears normal for degree of bladder distention. Incidental note is made of a left-sided pleural effusion. IMPRESSION: Bilateral renal cystic change. Electronically Signed   By: Alcide Clever M.D.   On: 01/23/2016 11:08   Dg Chest Port 1 View  02/01/2016  CLINICAL DATA:  Low blood pressure EXAM: PORTABLE CHEST 1 VIEW COMPARISON:  01/23/2016 FINDINGS: Cardiac shadow remains mildly enlarged. Mild central vascular prominence is seen without focal edema. No focal infiltrate is noted. No bony abnormality is seen. IMPRESSION: Mild central vascular  prominence similar to that seen on the prior exam. No new focal abnormality is noted. Electronically Signed   By: Alcide Clever M.D.   On: 02/01/2016 13:41    Assessment/Plan  #1-history of atrial fibrillation-at this point rate appears controlled although occasionally probably she will have higher pulses-she is on Lopressor 75 mg twice a day at this point will monitor.  #2 history of hypotension blood pressures appear to be quite low here in the 90s this was discussed with Dr. Alwyn Ren and will decrease her losartan down to 25 mg a day-she does not appear to be overtly symptomatic but this will have to be watched.  #3 hypokalemia this appears fairly mild will order a metabolic panel for updated on this.  Of note I did discuss patient's status with Dr. Alwyn Ren who is quite familiar with her-secondary to her atrial fibrillation with tachycardia hypertension as well as CHF-a cardiology consult is warranted and will write for that  [phone]

## 2016-02-06 ENCOUNTER — Other Ambulatory Visit: Payer: Self-pay | Admitting: *Deleted

## 2016-02-06 MED ORDER — HYDROCODONE-ACETAMINOPHEN 5-325 MG PO TABS: 1.0000 | ORAL_TABLET | Freq: Four times a day (QID) | ORAL | Status: DC | PRN

## 2016-02-07 ENCOUNTER — Encounter: Payer: Self-pay | Admitting: Internal Medicine

## 2016-02-07 ENCOUNTER — Encounter (HOSPITAL_COMMUNITY)
Admission: RE | Admit: 2016-02-07 | Discharge: 2016-02-07 | Disposition: A | Discharge status: Home / Self Care | Payer: Medicare Other | Source: Skilled Nursing Facility | Attending: *Deleted | Admitting: *Deleted

## 2016-02-07 ENCOUNTER — Non-Acute Institutional Stay (SKILLED_NURSING_FACILITY): Payer: Medicare Other | Admitting: Internal Medicine

## 2016-02-07 DIAGNOSIS — E039 Hypothyroidism, unspecified: Secondary | ICD-10-CM | POA: Diagnosis not present

## 2016-02-07 DIAGNOSIS — F0390 Unspecified dementia without behavioral disturbance: Secondary | ICD-10-CM

## 2016-02-07 LAB — BASIC METABOLIC PANEL
Anion gap: 11 (ref 5–15)
BUN: 59 mg/dL — AB (ref 6–20)
CALCIUM: 8.1 mg/dL — AB (ref 8.9–10.3)
CHLORIDE: 96 mmol/L — AB (ref 101–111)
CO2: 30 mmol/L (ref 22–32)
CREATININE: 2.71 mg/dL — AB (ref 0.44–1.00)
GFR calc Af Amer: 16 mL/min — ABNORMAL LOW (ref >60)
GFR calc non Af Amer: 14 mL/min — ABNORMAL LOW (ref >60)
Glucose, Bld: 93 mg/dL (ref 65–99)
Potassium: 4.1 mmol/L (ref 3.5–5.1)
SODIUM: 137 mmol/L (ref 135–145)

## 2016-02-07 LAB — MAGNESIUM: MAGNESIUM: 1.5 mg/dL — AB (ref 1.7–2.4)

## 2016-02-07 NOTE — Assessment & Plan Note (Signed)
TSH will be rechecked; because of her advanced age and  AF with rapid ventricular response TSH goal will be < 10

## 2016-02-07 NOTE — Progress Notes (Signed)
Patient ID: Michaela Tyler, female   DOB: 01/17/1921, 82 y.o.   MRN: [id number] This is a nursing facility follow up for MMSE at request of Speech Therapy as [facility]  Interim medical record and care since last [facility] visit was updated with review of diagnostic studies and change in clinical status since last visit were documented.   In the absence of her family I could not update family history as to history of stroke or dementia. Chart does mention diagnoses of generalized anxiety disorder as well as depression. She has been alprazolam as needed as well as a maintenance dose of Celexa. Her TSH has been mildly elevated and the thyroid dose was increased from 88 g to 125 g. This was changed back to 88 mcg  at the [facility] because of the atrial fibrillation with rapid ventricular response.  Comprehensive review of systems: She describes anxiety. She states she had nausea and vomiting once this morning. She denies edema despite edema being present. She describes intermittent shortness of breath. She mentions a previous "thyroid problem" Constitutional: No fever,significant weight change, fatigue  Eyes: No redness, discharge, pain, vision change ENT/mouth: No nasal congestion,  purulent discharge, earache,change in hearing ,sore throat  Cardiovascular: No chest pain, palpitations,paroxysmal nocturnal dyspnea, claudication, edema  Respiratory: No cough, sputum production,hemoptysis, DOE , significant snoring,apnea   Gastrointestinal: No heartburn,dysphagia,abdominal pain, rectal bleeding, melena,change in bowels Genitourinary: No dysuria,hematuria, pyuria,  incontinence, nocturia Musculoskeletal: No joint stiffness, joint swelling, weakness,pain Dermatologic: No rash, pruritus, change in appearance of skin Neurologic: No dizziness,headache,syncope, seizures, numbness , tingling Endocrine: No change in  hair/skin/ nails, excessive thirst, excessive hunger, excessive urination  Hematologic/lymphatic: No significant bruising, lymphadenopathy,abnormal bleeding Allergy/immunology: No itchy/ watery eyes, significant sneezing, urticaria, angioedema  Physical exam:  Pertinent or positive findings: Facial wasting is suggested. She has complete dentures. There is an irregular tachycardia. She has low-grade scattered expiratory rhonchi. There is no increased work of breathing. Her lower extremities are massive; there is 1/2+ pitting edema but there is also suggestion of a lymphedema component. General appearance:Adequately nourished; no acute distress Lymphatic: No lymphadenopathy about the head, neck, axilla . Eyes: No conjunctival inflammation or lid edema is present. There is no scleral icterus. Ears:  External ear exam shows no significant lesions or deformities.   Nose:  External nasal examination shows no deformity or inflammation. Nasal mucosa are pink and moist without lesions ,exudates Oral exam: lips and gums are healthy appearing.There is no oropharyngeal erythema or exudate . Neck:  No thyromegaly, masses, tenderness noted.    Abdomen:Bowel sounds are normal. Abdomen is soft and nontender with no organomegaly, hernias,masses. GU: deferred as previously addressed. Extremities:  No cyanosis, clubbing  Neurologic exam : MMSE revealed a score of 18 indicating severe dementia. Specific results are outlined in the Problem List Strength equal but decreased  in upper & lower extremities Balance,Rhomberg,finger to nose testing could not be completed due to clinical state Deep tendon reflexes are equal and 1/2+ in UE Skin: Warm & dry w/o tenting. No significant lesions or rash.    See summary under each active problem in the Problem List with associated updated therapeutic plan

## 2016-02-07 NOTE — Assessment & Plan Note (Signed)
Check VDRL, repeat TSH, and vitamin B12 level Little could be gained by starting an agent such as Aricept or Namenda

## 2016-02-07 NOTE — Patient Instructions (Signed)
orders for Matrix entry for : TSH B12 level RPR (VDRL)

## 2016-02-08 ENCOUNTER — Inpatient Hospital Stay (HOSPITAL_COMMUNITY)
Admission: EM | Admit: 2016-02-08 | Discharge: 2016-02-25 | DRG: 871 | Disposition: E | Payer: Medicare Other | Attending: Internal Medicine | Admitting: Internal Medicine

## 2016-02-08 ENCOUNTER — Emergency Department (HOSPITAL_COMMUNITY): Payer: Medicare Other

## 2016-02-08 ENCOUNTER — Non-Acute Institutional Stay (SKILLED_NURSING_FACILITY): Payer: Medicare Other | Admitting: Internal Medicine

## 2016-02-08 ENCOUNTER — Encounter (HOSPITAL_COMMUNITY): Payer: Self-pay | Admitting: Emergency Medicine

## 2016-02-08 DIAGNOSIS — A419 Sepsis, unspecified organism: Secondary | ICD-10-CM | POA: Diagnosis not present

## 2016-02-08 DIAGNOSIS — I471 Supraventricular tachycardia, unspecified: Secondary | ICD-10-CM | POA: Diagnosis present

## 2016-02-08 DIAGNOSIS — N189 Chronic kidney disease, unspecified: Secondary | ICD-10-CM

## 2016-02-08 DIAGNOSIS — N179 Acute kidney failure, unspecified: Secondary | ICD-10-CM | POA: Diagnosis present

## 2016-02-08 DIAGNOSIS — I2510 Atherosclerotic heart disease of native coronary artery without angina pectoris: Secondary | ICD-10-CM | POA: Diagnosis not present

## 2016-02-08 DIAGNOSIS — I400 Infective myocarditis: Secondary | ICD-10-CM | POA: Diagnosis not present

## 2016-02-08 DIAGNOSIS — E861 Hypovolemia: Secondary | ICD-10-CM | POA: Diagnosis not present

## 2016-02-08 DIAGNOSIS — I129 Hypertensive chronic kidney disease with stage 1 through stage 4 chronic kidney disease, or unspecified chronic kidney disease: Secondary | ICD-10-CM | POA: Diagnosis not present

## 2016-02-08 DIAGNOSIS — X58XXXA Exposure to other specified factors, initial encounter: Secondary | ICD-10-CM | POA: Diagnosis present

## 2016-02-08 DIAGNOSIS — F329 Major depressive disorder, single episode, unspecified: Secondary | ICD-10-CM | POA: Diagnosis present

## 2016-02-08 DIAGNOSIS — Z96652 Presence of left artificial knee joint: Secondary | ICD-10-CM | POA: Diagnosis present

## 2016-02-08 DIAGNOSIS — E871 Hypo-osmolality and hyponatremia: Secondary | ICD-10-CM | POA: Diagnosis not present

## 2016-02-08 DIAGNOSIS — I959 Hypotension, unspecified: Secondary | ICD-10-CM | POA: Diagnosis not present

## 2016-02-08 DIAGNOSIS — F0390 Unspecified dementia without behavioral disturbance: Secondary | ICD-10-CM | POA: Diagnosis present

## 2016-02-08 DIAGNOSIS — I509 Heart failure, unspecified: Secondary | ICD-10-CM | POA: Diagnosis not present

## 2016-02-08 DIAGNOSIS — I5043 Acute on chronic combined systolic (congestive) and diastolic (congestive) heart failure: Secondary | ICD-10-CM | POA: Diagnosis present

## 2016-02-08 DIAGNOSIS — K219 Gastro-esophageal reflux disease without esophagitis: Secondary | ICD-10-CM | POA: Diagnosis present

## 2016-02-08 DIAGNOSIS — Z66 Do not resuscitate: Secondary | ICD-10-CM | POA: Diagnosis present

## 2016-02-08 DIAGNOSIS — F419 Anxiety disorder, unspecified: Secondary | ICD-10-CM | POA: Diagnosis present

## 2016-02-08 DIAGNOSIS — E7800 Pure hypercholesterolemia, unspecified: Secondary | ICD-10-CM | POA: Diagnosis not present

## 2016-02-08 DIAGNOSIS — I130 Hypertensive heart and chronic kidney disease with heart failure and stage 1 through stage 4 chronic kidney disease, or unspecified chronic kidney disease: Secondary | ICD-10-CM | POA: Diagnosis not present

## 2016-02-08 DIAGNOSIS — Z7982 Long term (current) use of aspirin: Secondary | ICD-10-CM

## 2016-02-08 DIAGNOSIS — I10 Essential (primary) hypertension: Secondary | ICD-10-CM | POA: Diagnosis not present

## 2016-02-08 DIAGNOSIS — I9589 Other hypotension: Secondary | ICD-10-CM

## 2016-02-08 DIAGNOSIS — E872 Acidosis, unspecified: Secondary | ICD-10-CM | POA: Diagnosis present

## 2016-02-08 DIAGNOSIS — N289 Disorder of kidney and ureter, unspecified: Secondary | ICD-10-CM | POA: Diagnosis not present

## 2016-02-08 DIAGNOSIS — I482 Chronic atrial fibrillation, unspecified: Secondary | ICD-10-CM | POA: Diagnosis present

## 2016-02-08 DIAGNOSIS — S31801A Laceration without foreign body of unspecified buttock, initial encounter: Secondary | ICD-10-CM | POA: Diagnosis present

## 2016-02-08 DIAGNOSIS — I517 Cardiomegaly: Secondary | ICD-10-CM | POA: Diagnosis not present

## 2016-02-08 DIAGNOSIS — N184 Chronic kidney disease, stage 4 (severe): Secondary | ICD-10-CM | POA: Diagnosis present

## 2016-02-08 DIAGNOSIS — F411 Generalized anxiety disorder: Secondary | ICD-10-CM | POA: Diagnosis present

## 2016-02-08 DIAGNOSIS — L8990 Pressure ulcer of unspecified site, unspecified stage: Secondary | ICD-10-CM | POA: Diagnosis present

## 2016-02-08 DIAGNOSIS — J9690 Respiratory failure, unspecified, unspecified whether with hypoxia or hypercapnia: Secondary | ICD-10-CM

## 2016-02-08 DIAGNOSIS — N170 Acute kidney failure with tubular necrosis: Secondary | ICD-10-CM | POA: Diagnosis not present

## 2016-02-08 DIAGNOSIS — E860 Dehydration: Secondary | ICD-10-CM | POA: Diagnosis present

## 2016-02-08 DIAGNOSIS — I4891 Unspecified atrial fibrillation: Secondary | ICD-10-CM | POA: Diagnosis not present

## 2016-02-08 LAB — CBC WITH DIFFERENTIAL/PLATELET
BASOS PCT: 0 %
Basophils Absolute: 0 10*3/uL (ref 0.0–0.1)
EOS ABS: 0.1 10*3/uL (ref 0.0–0.7)
EOS PCT: 2 %
HCT: 40.5 % (ref 36.0–46.0)
HEMOGLOBIN: 13.2 g/dL (ref 12.0–15.0)
LYMPHS ABS: 1.6 10*3/uL (ref 0.7–4.0)
Lymphocytes Relative: 23 %
MCH: 31.5 pg (ref 26.0–34.0)
MCHC: 32.6 g/dL (ref 30.0–36.0)
MCV: 96.7 fL (ref 78.0–100.0)
MONO ABS: 0.6 10*3/uL (ref 0.1–1.0)
MONOS PCT: 9 %
NEUTROS PCT: 66 %
Neutro Abs: 4.6 10*3/uL (ref 1.7–7.7)
Platelets: 136 10*3/uL — ABNORMAL LOW (ref 150–400)
RBC: 4.19 MIL/uL (ref 3.87–5.11)
RDW: 18.7 % — AB (ref 11.5–15.5)
WBC: 7 10*3/uL (ref 4.0–10.5)

## 2016-02-08 LAB — URINALYSIS, ROUTINE W REFLEX MICROSCOPIC
Bilirubin Urine: NEGATIVE
Glucose, UA: NEGATIVE mg/dL
HGB URINE DIPSTICK: NEGATIVE
Ketones, ur: NEGATIVE mg/dL
Leukocytes, UA: NEGATIVE
NITRITE: NEGATIVE
PROTEIN: NEGATIVE mg/dL
pH: 5.5 (ref 5.0–8.0)

## 2016-02-08 LAB — COMPREHENSIVE METABOLIC PANEL
ALK PHOS: 117 U/L (ref 38–126)
ALT: 27 U/L (ref 14–54)
ANION GAP: 14 (ref 5–15)
AST: 27 U/L (ref 15–41)
Albumin: 3.1 g/dL — ABNORMAL LOW (ref 3.5–5.0)
BILIRUBIN TOTAL: 1.9 mg/dL — AB (ref 0.3–1.2)
BUN: 65 mg/dL — ABNORMAL HIGH (ref 6–20)
CALCIUM: 8.2 mg/dL — AB (ref 8.9–10.3)
CO2: 24 mmol/L (ref 22–32)
Chloride: 94 mmol/L — ABNORMAL LOW (ref 101–111)
Creatinine, Ser: 3.85 mg/dL — ABNORMAL HIGH (ref 0.44–1.00)
GFR calc non Af Amer: 9 mL/min — ABNORMAL LOW (ref >60)
GFR, EST AFRICAN AMERICAN: 11 mL/min — AB (ref >60)
Glucose, Bld: 90 mg/dL (ref 65–99)
POTASSIUM: 5.2 mmol/L — AB (ref 3.5–5.1)
SODIUM: 132 mmol/L — AB (ref 135–145)
TOTAL PROTEIN: 6.6 g/dL (ref 6.5–8.1)

## 2016-02-08 LAB — LACTIC ACID, PLASMA
LACTIC ACID, VENOUS: 3.9 mmol/L — AB (ref 0.5–2.0)
Lactic Acid, Venous: 3.8 mmol/L (ref 0.5–2.0)

## 2016-02-08 LAB — TROPONIN I: TROPONIN I: 0.16 ng/mL — AB (ref <0.031)

## 2016-02-08 LAB — PROCALCITONIN: Procalcitonin: 0.47 ng/mL

## 2016-02-08 MED ORDER — ALPRAZOLAM 0.25 MG PO TABS
0.2500 mg | ORAL_TABLET | Freq: Two times a day (BID) | ORAL | Status: DC | PRN
  Administered 2016-02-09 – 2016-02-14 (×6): 0.25 mg via ORAL
  Filled 2016-02-08 (×6): qty 1

## 2016-02-08 MED ORDER — VANCOMYCIN HCL IN DEXTROSE 1-5 GM/200ML-% IV SOLN
1000.0000 mg | Freq: Once | INTRAVENOUS | Status: AC
  Administered 2016-02-08: 1000 mg via INTRAVENOUS
  Filled 2016-02-08: qty 200

## 2016-02-08 MED ORDER — SODIUM CHLORIDE 0.9 % IV SOLN
INTRAVENOUS | Status: AC
  Administered 2016-02-09 (×2): via INTRAVENOUS

## 2016-02-08 MED ORDER — ONDANSETRON HCL 4 MG PO TABS: 4.0000 mg | ORAL_TABLET | Freq: Four times a day (QID) | ORAL | Status: DC | PRN

## 2016-02-08 MED ORDER — SODIUM CHLORIDE 0.9 % IV SOLN
1000.0000 mL | INTRAVENOUS | Status: DC
  Administered 2016-02-08 – 2016-02-12 (×6): 1000 mL via INTRAVENOUS

## 2016-02-08 MED ORDER — SODIUM CHLORIDE 0.9 % IV SOLN
1000.0000 mL | Freq: Once | INTRAVENOUS | Status: AC
  Administered 2016-02-08: 1000 mL via INTRAVENOUS

## 2016-02-08 MED ORDER — ENOXAPARIN SODIUM 30 MG/0.3ML ~~LOC~~ SOLN
30.0000 mg | SUBCUTANEOUS | Status: DC
  Administered 2016-02-09 – 2016-02-14 (×6): 30 mg via SUBCUTANEOUS
  Filled 2016-02-08 (×6): qty 0.3

## 2016-02-08 MED ORDER — SODIUM CHLORIDE 0.9 % IV BOLUS (SEPSIS)
2000.0000 mL | Freq: Once | INTRAVENOUS | Status: AC
  Administered 2016-02-08: 2000 mL via INTRAVENOUS

## 2016-02-08 MED ORDER — MORPHINE SULFATE (PF) 2 MG/ML IV SOLN
1.0000 mg | INTRAVENOUS | Status: DC | PRN
  Administered 2016-02-09 – 2016-02-14 (×3): 1 mg via INTRAVENOUS
  Filled 2016-02-08 (×3): qty 1

## 2016-02-08 MED ORDER — PIPERACILLIN-TAZOBACTAM 3.375 G IVPB 30 MIN
3.3750 g | Freq: Once | INTRAVENOUS | Status: AC
  Administered 2016-02-08: 3.375 g via INTRAVENOUS
  Filled 2016-02-08: qty 50

## 2016-02-08 MED ORDER — SODIUM CHLORIDE 0.9 % IV SOLN
INTRAVENOUS | Status: AC
  Administered 2016-02-09: via INTRAVENOUS

## 2016-02-08 MED ORDER — ONDANSETRON HCL 4 MG/2ML IJ SOLN
4.0000 mg | Freq: Four times a day (QID) | INTRAMUSCULAR | Status: DC | PRN
  Administered 2016-02-10 – 2016-02-14 (×7): 4 mg via INTRAVENOUS
  Filled 2016-02-08 (×7): qty 2

## 2016-02-08 MED ORDER — VANCOMYCIN HCL IN DEXTROSE 1-5 GM/200ML-% IV SOLN
1000.0000 mg | Freq: Once | INTRAVENOUS | Status: AC
  Administered 2016-02-09: 1000 mg via INTRAVENOUS
  Filled 2016-02-08: qty 200

## 2016-02-08 MED ORDER — PIPERACILLIN-TAZOBACTAM 3.375 G IVPB 30 MIN: 3.3750 g | Freq: Once | INTRAVENOUS | Status: DC

## 2016-02-08 NOTE — ED Notes (Signed)
CRITICAL VALUE ALERT  Critical value received:  latic acid  3.8  Date of notification:  02/01/2016  Time of notification:  2205  Critical value read back:Yes.    Nurse who received alert:  Thornton Daleson L Opel Lejeune, RN  MD notified (1st page):  Patria Maneampos  Time of first page:  2205  MD notified (2nd page):  Time of second page:  Responding MD:  Patria Maneampos  Time MD responded:  2205

## 2016-02-08 NOTE — ED Notes (Signed)
Pt states tooth broke on Wed. Has an appointment on Tuesday w/ her dentist.

## 2016-02-08 NOTE — Progress Notes (Signed)
Patient ID: Michaela Tyler, female   DOB: 08-25-1921, 82 y.o.   MRN: [id number]    Location:  [facility]   Place of Service:  SNF (31)   Michaela Gully, MD  Patient Care Team: Michaela Gully, MD as PCP - General (Internal Medicine)  Extended Emergency Contact Information Primary Emergency Contact: Michaela Tyler [place] Mobile Phone: [phone] Relation: Niece Secondary Emergency Contact: Michaela Tyler  [place] Mobile Phone: [phone] Relation: Spouse  Goals of care: Advanced Directive information Advanced Directives 02/09/2016  Does patient have an advance directive? Yes  Type of Advance Directive Out of facility DNR (pink MOST or yellow form)  Does patient want to make changes to advanced directive? No - Patient declined  Copy of advanced directive(s) in chart? Yes  Would patient like information on creating an advanced directive? No - patient declined information  Pre-existing out of facility DNR order (yellow form or pink MOST form) -     Chief Complaint  Patient presents with  . Acute Visit  Secondary to hypotension with recent systolics in the 70-80 range today HPI:  Pt is a 82 y.o. female seen today for an acute visit for above-stated issues.  She is able an elderly resident with a complicated medical history including recent hospitalization for acute on chronic renal insufficiency with a creatinine that was up to 4.88 BUN of 97 this improved with hydration.  This is complicated with her CHF.  She has a history of chronic combined diastolic and systolic CHF.  She does have a history of rapid ventricular response-she is currently on Lopressor 75 mg twice a day-she is also onvLosartan 50 mg a day her blood pressure continues to run low which is not entirely unusual for her recently noted 93/68 98/58 previously-this was discussed with Dr. Alwyn Ren via phone and we did reduce her losartan down to 25 mg a day   Nursing  staff notified me today that they have been getting low blood pressures when taken by machine systolic in the 50s to 70s-I did take it manually and got approximately 82/59-clinically patient appears weak but not in any distress does not complain of dizziness.or syncopal type episodes--she tends to be a poor historian with a history of significant dementia  Her heart rate is mildly tachycardic at 104.     Past Medical History  Diagnosis Date  . Hypertension   . High cholesterol   . GERD (gastroesophageal reflux disease)   . Arthritis   . Anxiety   . Depression   . Cataract   . Hearing reduced   . PONV (postoperative nausea and vomiting)   . H/O left breast biopsy     NO CANCER  . Low back pain   . Chronic right hip pain   . Atrial fibrillation (HCC)   . Thyroid disease   . GAD (generalized anxiety disorder)   . Dysphagia    Past Surgical History  Procedure Laterality Date  . Total knee arthroplasty  2008    left, [facility]  . Elbow surgery      left  . Abdominal hysterectomy    . Knee arthroscopy      right  . Breast lumpectomy  1998    left  . Cataract extraction w/phaco  03/23/2012    Procedure: CATARACT EXTRACTION PHACO AND INTRAOCULAR LENS PLACEMENT (IOC);  Surgeon: Loraine Leriche T. Nile Riggs, MD;  Location: [facility];  Service: Ophthalmology;  Laterality: Right;  CDE:12.73  . Cataract extraction w/phaco  04/20/2012    Procedure: CATARACT EXTRACTION PHACO AND INTRAOCULAR LENS PLACEMENT (IOC);  Surgeon: Loraine Leriche T. Nile Riggs, MD;  Location: [facility];  Service: Ophthalmology;  Laterality: Left;  CDE:7.70  . Yag laser application Left 11/07/2014    Procedure: YAG LASER APPLICATION;  Surgeon: Loraine Leriche T. Nile Riggs, MD;  Location: [facility];  Service: Ophthalmology;  Laterality: Left;  left  . Yag laser application Right 11/21/2014    Procedure: YAG LASER APPLICATION;  Surgeon: Loraine Leriche T. Nile Riggs, MD;  Location: [facility];  Service: Ophthalmology;  Laterality: Right;  right    No Known Allergies  Medications have  been reviewed and include.  Aspirin 81 mg daily.  Celexa 10 mg daily.  Vitamin D 50,000 units daily.  Synthroid 88 g daily.  Losartan 25 mg daily.  Magnesium 400 mg twice a day.  Lopressor 25 mg twice a day.  Prilosec 20 mg daily.  Phenergan 12.5 mg when necessary.  Demadex 20 mg daily.  Tramadol 50 mg every 12 hours.prn  Xanax 0.25 mg twice a day when necessary  Review of Systems--very limited secondary to dementia.  Please see history of present illness she is currently denying any shortness of breath Chest pain dizziness or syncopal-type feelings-she does appear however somewhat more weak than when I last saw her   Immunization History  Administered Date(s) Administered  . Tdap 02/07/2012   Pertinent  Health Maintenance Due  Topic Date Due  . DEXA SCAN  03/07/1988  . PNA vac Low Risk Adult (1 of 2 - PCV13) 03/07/1988  . INFLUENZA VACCINE  05/27/2016   No flowsheet data found. Functional Status Survey:    Filed Vitals:   02/11/16 1139  BP: 82/59  Pulse: 104  Resp: 18   Of note blood pressure was taken in both arms and several times and readings appear to be systolically in this 80s although this was very difficult to hear at times  Physical Exam   In general this is a frail elderly female in no distress resting comfortable be in bed.  Her skin is warm and dry.  Eyes pupils appear reactive to light.  Oropharynx clear mucous membranes appear fairly moist.  Chest she has shallow air entry  there is no labored breathing.  Heart is irregular irregular rate and rhythmWith pulse rate in the low 100s-- without murmur gallop or rub she has baseline I would say 2+ lower extremity edema area  Abdomen is obese soft nontender positive bowel sounds.  Muscle skeletal has generalized weakness  Most noticeable in her lower extremities is able to move her upper extremities but appears a bit weaker today.    Neurologic cannot really appreciate any  lateralizing findings cranial nerves appear grossly intact she has generalized weakness especially lower extremities.  Psych she is oriented to self is pleasant and appropriate follows verbal commands without difficulty  Labs reviewed:  02/07/2016.  Sodium 137 potassium 4.1 BUN 59 creatinine 2.71.  The knees and 1.5.  02/01/2016.  WBC 7.4 hemoglobin 12.0 platelets 133    Lab Results  Component Value Date   TSH 6.674* 12/31/2015         Assessment/Plan hypotension with increased weakness-patient has a complicated history-I am concerned about the hypotension which appears to be progressing here-baseline at times is in the 90s but now she's having more readings in the low 80s and apparently  if the machine is correct sometimes the 70s-will send her to the ER for expedient evaluation-she is not in any distress but  would be concerned about this progressing   would be somewhat concerned about her renal function as well and this is complicated with a history of CHF as well as atrial fibrillation she is somewhat tachycardic frequently but appears to tolerate this fairly well--cardiology consult is pending but I feel she needs expedient evaluation today     [id number]

## 2016-02-08 NOTE — ED Notes (Signed)
Lab at bedside

## 2016-02-08 NOTE — ED Notes (Signed)
CRITICAL VALUE ALERT  Critical value received:lactic acid 3.9  Date of notification:  2015/12/27  Time of notification:  2018  Critical value read back:Yes.    Nurse who received alert:  Meridee Scoreharlotte Keyontae Huckeby, RN  MD notified (1st page): Dr Patria Maneampos  Time of first page:  2020  MD notified (2nd page):Dr Patria Maneampos  Time of second page:2021  Responding MD:  Dr Patria Maneampos  Time MD responded:  2021

## 2016-02-08 NOTE — ED Provider Notes (Signed)
CSN: [id number]     Arrival date & time 02/03/2016  1742 History   First MD Initiated Contact with Patient 02/11/2016 1758     Chief Complaint  Patient presents with  . Hypotension    Level V caveat: Dementia   HPI Patient presents to [place] from the nursing home for hypotension.  Patient is on metoprolol and losartan.  As reported that she is at baseline mental status.  No complaints of vomiting or diarrhea.  No reports of abdominal pain.  Husband is present but is unable to provide any significant additional information except that he does report that her mental status is normal this time.  He confirms that she is a DO NOT RESUSCITATE.  He confirms no mechanical ventilation he confirms no CPR.  He confirms no vasopressors.  He otherwise wants fluids and antibiotics and other standard care.    Past Medical History  Diagnosis Date  . Hypertension   . High cholesterol   . GERD (gastroesophageal reflux disease)   . Arthritis   . Anxiety   . Depression   . Cataract   . Hearing reduced   . PONV (postoperative nausea and vomiting)   . H/O left breast biopsy     NO CANCER  . Low back pain   . Chronic right hip pain   . Atrial fibrillation (HCC)   . Thyroid disease   . GAD (generalized anxiety disorder)   . Dysphagia    Past Surgical History  Procedure Laterality Date  . Total knee arthroplasty  2008    left, [facility]  . Elbow surgery      left  . Abdominal hysterectomy    . Knee arthroscopy      right  . Breast lumpectomy  1998    left  . Cataract extraction w/phaco  03/23/2012    Procedure: CATARACT EXTRACTION PHACO AND INTRAOCULAR LENS PLACEMENT (IOC);  Surgeon: Loraine Leriche T. Nile Riggs, MD;  Location: [facility];  Service: Ophthalmology;  Laterality: Right;  CDE:12.73  . Cataract extraction w/phaco  04/20/2012    Procedure: CATARACT EXTRACTION PHACO AND INTRAOCULAR LENS PLACEMENT (IOC);  Surgeon: Loraine Leriche T. Nile Riggs, MD;  Location: [facility];  Service: Ophthalmology;  Laterality: Left;  CDE:7.70  . Yag  laser application Left 11/07/2014    Procedure: YAG LASER APPLICATION;  Surgeon: Loraine Leriche T. Nile Riggs, MD;  Location: [facility];  Service: Ophthalmology;  Laterality: Left;  left  . Yag laser application Right 11/21/2014    Procedure: YAG LASER APPLICATION;  Surgeon: Loraine Leriche T. Nile Riggs, MD;  Location: [facility];  Service: Ophthalmology;  Laterality: Right;  right   Family History  Problem Relation Age of Onset  . Colon cancer Neg Hx   . Liver disease Neg Hx    Social History  Substance Use Topics  . Smoking status: Never Smoker   . Smokeless tobacco: Never Used  . Alcohol Use: No   OB History    No data available     Review of Systems  Unable to perform ROS: Dementia      Allergies  Review of patient's allergies indicates no known allergies.  Home Medications   Prior to Admission medications   Medication Sig Start Date End Date Taking? Authorizing Provider  ALPRAZolam (XANAX) 0.25 MG tablet Take 1 tablet (0.25 mg total) by mouth 2 (two) times daily as needed for anxiety. 01/29/16  Yes Avon Gully, MD  aspirin EC 81 MG tablet Take 81 mg by mouth daily. Monitor for s/s of bleeding  Yes Historical Provider, MD  citalopram (CELEXA) 10 MG tablet Take 10 mg by mouth at bedtime.  09/15/14  Yes Historical Provider, MD  ENSURE (ENSURE) Take 237 mLs by mouth daily.   Yes Historical Provider, MD  ferrous sulfate 325 (65 FE) MG tablet Take 325 mg by mouth daily with breakfast.   Yes Historical Provider, MD  levothyroxine (SYNTHROID, LEVOTHROID) 88 MCG tablet Take 1 tablet (88 mcg total) by mouth daily. 01/17/16  Yes Pecola LawlessWilliam F Hopper, MD  losartan (COZAAR) 25 MG tablet Take 25 mg by mouth daily.   Yes Historical Provider, MD  magnesium oxide (MAG-OX) 400 (241.3 Mg) MG tablet Take 1 tablet (400 mg total) by mouth 2 (two) times daily. 01/08/16  Yes Avon Gullyesfaye Fanta, MD  Metoprolol Tartrate 75 MG TABS Take 1 tablet by mouth twice a day   Yes Historical Provider, MD  omeprazole (PRILOSEC) 20 MG capsule Take 20  mg by mouth daily. 10/07/14  Yes Historical Provider, MD  potassium chloride (K-DUR) 10 MEQ tablet Take 20 mEq by mouth daily.   Yes Historical Provider, MD  promethazine (PHENERGAN) 25 MG tablet Take 12.5 mg by mouth every 6 (six) hours as needed for nausea or vomiting.   Yes Historical Provider, MD  torsemide (DEMADEX) 20 MG tablet Take 2 tablets (40 mg total) by mouth daily. 01/29/16  Yes Avon Gullyesfaye Fanta, MD  traMADol (ULTRAM) 50 MG tablet Take 1 tablet (50 mg total) by mouth every 12 (twelve) hours as needed. 01/29/16  Yes Avon Gullyesfaye Fanta, MD  Vitamin D, Ergocalciferol, (DRISDOL) 50000 units CAPS capsule Take 1 capsule (50,000 Units total) by mouth every 7 (seven) days. 01/29/16  Yes Tesfaye Fanta, MD   BP 71/48 mmHg  Pulse 115  Temp(Src) 97.8 F (36.6 C)  Resp 16  Ht 5\' 11"  (1.803 m)  Wt 238 lb (107.956 kg)  BMI 33.21 kg/m2  SpO2 97% Physical Exam  Constitutional: She appears well-developed and well-nourished. No distress.  HENT:  Head: Normocephalic and atraumatic.  Eyes: EOM are normal.  Neck: Normal range of motion.  Cardiovascular: Normal rate, regular rhythm and normal heart sounds.   Pulmonary/Chest: Effort normal and breath sounds normal.  Abdominal: Soft. She exhibits no distension. There is no tenderness.  Musculoskeletal: Normal range of motion.  Neurological: She is alert.  Moves all 4 extremities equally  Skin: Skin is warm and dry.  Psychiatric: She has a normal mood and affect. Judgment normal.  Nursing note and vitals reviewed.   ED Course  Procedures (including critical care time)  CRITICAL CARE Performed by: Lyanne CoAMPOS,Jocie Meroney M Total critical care time: 33 minutes Critical care time was exclusive of separately billable procedures and treating other patients. Critical care was necessary to treat or prevent imminent or life-threatening deterioration. Critical care was time spent personally by me on the following activities: development of treatment plan with patient  and/or surrogate as well as nursing, discussions with consultants, evaluation of patient's response to treatment, examination of patient, obtaining history from patient or surrogate, ordering and performing treatments and interventions, ordering and review of laboratory studies, ordering and review of radiographic studies, pulse oximetry and re-evaluation of patient's condition.   Labs Review Labs Reviewed  CBC WITH DIFFERENTIAL/PLATELET - Abnormal; Notable for the following:    RDW 18.7 (*)    Platelets 136 (*)    All other components within normal limits  URINALYSIS, ROUTINE W REFLEX MICROSCOPIC (NOT AT [facility]) - Abnormal; Notable for the following:    Specific Gravity, Urine >1.030 (*)  All other components within normal limits  LACTIC ACID, PLASMA - Abnormal; Notable for the following:    Lactic Acid, Venous 3.9 (*)    All other components within normal limits  LACTIC ACID, PLASMA - Abnormal; Notable for the following:    Lactic Acid, Venous 3.8 (*)    All other components within normal limits  COMPREHENSIVE METABOLIC PANEL - Abnormal; Notable for the following:    Sodium 132 (*)    Potassium 5.2 (*)    Chloride 94 (*)    BUN 65 (*)    Creatinine, Ser 3.85 (*)    Calcium 8.2 (*)    Albumin 3.1 (*)    Total Bilirubin 1.9 (*)    GFR calc non Af Amer 9 (*)    GFR calc Af Amer 11 (*)    All other components within normal limits  TROPONIN I - Abnormal; Notable for the following:    Troponin I 0.16 (*)    All other components within normal limits  CULTURE, BLOOD (ROUTINE X 2)  CULTURE, BLOOD (ROUTINE X 2)  MRSA PCR SCREENING  PROCALCITONIN  CORTISOL  LACTIC ACID, PLASMA  LACTIC ACID, PLASMA  PROTIME-INR  APTT  BASIC METABOLIC PANEL  CBC  CBC  CREATININE, SERUM    Imaging Review Dg Chest Portable 1 View    CLINICAL DATA:  Hypotension, dyspnea. EXAM: PORTABLE CHEST 1 VIEW COMPARISON:  02/01/2016 FINDINGS: Moderate cardiomegaly, unchanged. Elevation of the right  hemidiaphragm, unchanged. Mildly prominent hilar contours, likely vascular. The lungs are clear. No large effusions. No pneumothorax. IMPRESSION: Cardiomegaly. Prominent central pulmonary vasculature, possibly related to pulmonary arterial hypertension. No acute cardiopulmonary findings. Electronically Signed   By: Ellery Plunk M.D.   On: 02/23/2016 21:05   I have personally reviewed and evaluated these images and lab results as part of my medical decision-making.   EKG Interpretation None      MDM   Final diagnoses:  Hypotension, unspecified hypotension type  Acute on chronic renal insufficiency (HCC)    Presently the patient is overall well-appearing.  She is nontoxic appearing.  Despite 5 L of normal saline she continues to be severely hypotensive.  Chest x-ray and lab work without significant abnormality except for mild acute on chronic renal insufficiency.  Elevated lactic acid 3.9 and despite IV fluids and repeat lactate is 3.8.  IV vancomycin and IV Zosyn given.  May represent sepsis.  She is not on steroids chronically but I sent a cortisol level.  If she continues to be hypotensive she may benefit from hydrocortisone    Azalia Bilis, MD 02/09/16 0028

## 2016-02-08 NOTE — ED Notes (Signed)
Patient with noted hypotension at SNF. Patient here multiple times for same. Patient noted to be on two BP meds, metoprolol and losartan. PA at [facility] called by RN to ask BP meds and chronicity of hypotension with multiple ED visits. PA concerned that it was just lower today at 78/58. Patient at baseline mental status.

## 2016-02-09 ENCOUNTER — Encounter (HOSPITAL_COMMUNITY): Payer: Self-pay | Admitting: *Deleted

## 2016-02-09 DIAGNOSIS — E872 Acidosis: Secondary | ICD-10-CM

## 2016-02-09 DIAGNOSIS — N189 Chronic kidney disease, unspecified: Secondary | ICD-10-CM

## 2016-02-09 DIAGNOSIS — A419 Sepsis, unspecified organism: Principal | ICD-10-CM

## 2016-02-09 DIAGNOSIS — Z66 Do not resuscitate: Secondary | ICD-10-CM

## 2016-02-09 DIAGNOSIS — N179 Acute kidney failure, unspecified: Secondary | ICD-10-CM

## 2016-02-09 DIAGNOSIS — I482 Chronic atrial fibrillation: Secondary | ICD-10-CM

## 2016-02-09 DIAGNOSIS — N289 Disorder of kidney and ureter, unspecified: Secondary | ICD-10-CM | POA: Insufficient documentation

## 2016-02-09 LAB — LACTIC ACID, PLASMA
Lactic Acid, Venous: 3.3 mmol/L (ref 0.5–2.0)
Lactic Acid, Venous: 3.3 mmol/L (ref 0.5–2.0)

## 2016-02-09 LAB — BASIC METABOLIC PANEL
Anion gap: 13 (ref 5–15)
BUN: 62 mg/dL — ABNORMAL HIGH (ref 6–20)
CALCIUM: 7.8 mg/dL — AB (ref 8.9–10.3)
CO2: 22 mmol/L (ref 22–32)
CREATININE: 3.78 mg/dL — AB (ref 0.44–1.00)
Chloride: 99 mmol/L — ABNORMAL LOW (ref 101–111)
GFR calc non Af Amer: 9 mL/min — ABNORMAL LOW (ref >60)
GFR, EST AFRICAN AMERICAN: 11 mL/min — AB (ref >60)
Glucose, Bld: 99 mg/dL (ref 65–99)
Potassium: 4.7 mmol/L (ref 3.5–5.1)
SODIUM: 134 mmol/L — AB (ref 135–145)

## 2016-02-09 LAB — MRSA PCR SCREENING: MRSA BY PCR: NEGATIVE

## 2016-02-09 LAB — CBC
HCT: 38.4 % (ref 36.0–46.0)
Hemoglobin: 12.6 g/dL (ref 12.0–15.0)
MCH: 32.1 pg (ref 26.0–34.0)
MCHC: 32.8 g/dL (ref 30.0–36.0)
MCV: 97.7 fL (ref 78.0–100.0)
PLATELETS: 149 10*3/uL — AB (ref 150–400)
RBC: 3.93 MIL/uL (ref 3.87–5.11)
RDW: 18.7 % — AB (ref 11.5–15.5)
WBC: 7.2 10*3/uL (ref 4.0–10.5)

## 2016-02-09 LAB — APTT: aPTT: 36 seconds (ref 24–37)

## 2016-02-09 LAB — PROTIME-INR
INR: 1.52 — ABNORMAL HIGH (ref 0.00–1.49)
Prothrombin Time: 18.4 seconds — ABNORMAL HIGH (ref 11.6–15.2)

## 2016-02-09 LAB — CORTISOL: Cortisol, Plasma: 24.4 ug/dL

## 2016-02-09 MED ORDER — PIPERACILLIN-TAZOBACTAM IN DEX 2-0.25 GM/50ML IV SOLN
INTRAVENOUS | Status: AC
  Filled 2016-02-09: qty 50

## 2016-02-09 MED ORDER — VANCOMYCIN HCL 10 G IV SOLR
1500.0000 mg | INTRAVENOUS | Status: DC
  Administered 2016-02-10: 1500 mg via INTRAVENOUS
  Filled 2016-02-09 (×2): qty 1500

## 2016-02-09 MED ORDER — PIPERACILLIN SOD-TAZOBACTAM SO 2.25 (2-0.25) G IV SOLR
2.2500 g | Freq: Three times a day (TID) | INTRAVENOUS | Status: DC
  Administered 2016-02-09 – 2016-02-14 (×17): 2.25 g via INTRAVENOUS
  Filled 2016-02-09 (×19): qty 2.25

## 2016-02-09 MED ORDER — VANCOMYCIN HCL 10 G IV SOLR
1500.0000 mg | INTRAVENOUS | Status: DC
  Filled 2016-02-09: qty 1500

## 2016-02-09 MED ORDER — PIPERACILLIN-TAZOBACTAM IN DEX 2-0.25 GM/50ML IV SOLN
2.2500 g | Freq: Once | INTRAVENOUS | Status: AC
  Administered 2016-02-09: 2.25 g via INTRAVENOUS
  Filled 2016-02-09: qty 50

## 2016-02-09 NOTE — Progress Notes (Signed)
ANTIBIOTIC CONSULT NOTE - INITIAL  Pharmacy Consult for Vancomycin & Zosyn Indication: sepsis  No Known Allergies  Patient Measurements: Height:  (180.3 cm) Weight: 238 lb (107.956 kg) IBW/kg (Calculated) : 70.8 Adjusted Body Weight:   Vital Signs: Temp: 97.1 F (36.2 C) (04/15 0700) Temp Source: Oral (04/15 0700) BP: 79/60 mmHg (04/15 0700) Pulse Rate: 113 (04/15 0700) Intake/Output from previous day: 04/14 0701 - 04/15 0700 In: 4720.8 [I.V.:4720.8] Out: 100 [Urine:100] Intake/Output from this shift:    Labs:  Recent Labs  02/07/16 0720 03/09/16 1918 02/09/16 0412  WBC  --  7.0 7.2  HGB  --  13.2 12.6  PLT  --  136* 149*  CREATININE 2.71* 3.85* 3.78*   Estimated Creatinine Clearance: 12.8 mL/min (by C-G formula based on Cr of 3.78). No results for input(s): VANCOTROUGH, VANCOPEAK, VANCORANDOM, GENTTROUGH, GENTPEAK, GENTRANDOM, TOBRATROUGH, TOBRAPEAK, TOBRARND, AMIKACINPEAK, AMIKACINTROU, AMIKACIN in the last 72 hours.   Microbiology: Recent Results (from the past 720 hour(s))  Urine culture     Status: None   Collection Time: 01/22/16  2:15 PM  Result Value Ref Range Status   Specimen Description URINE, CATHETERIZED  Final   Special Requests NONE  Final   Culture   Final    >=100,000 COLONIES/mL ESCHERICHIA COLI Performed at [facility]    Report Status 01/25/2016 FINAL  Final   Organism ID, Bacteria ESCHERICHIA COLI  Final      Susceptibility   Escherichia coli - MIC*    AMPICILLIN >=32 RESISTANT Resistant     CEFAZOLIN <=4 SENSITIVE Sensitive     CEFTRIAXONE <=1 SENSITIVE Sensitive     CIPROFLOXACIN >=4 RESISTANT Resistant     GENTAMICIN >=16 RESISTANT Resistant     IMIPENEM <=0.25 SENSITIVE Sensitive     NITROFURANTOIN <=16 SENSITIVE Sensitive     TRIMETH/SULFA <=20 SENSITIVE Sensitive     AMPICILLIN/SULBACTAM 16 INTERMEDIATE Intermediate     PIP/TAZO <=4 SENSITIVE Sensitive     * >=100,000 COLONIES/mL ESCHERICHIA COLI  Urine  culture     Status: None   Collection Time: 01/26/16  9:02 PM  Result Value Ref Range Status   Specimen Description URINE, CATHETERIZED  Final   Special Requests NONE  Final   Culture   Final    NO GROWTH 2 DAYS Performed at [facility]    Report Status 01/29/2016 FINAL  Final  Blood culture (routine x 2)     Status: None (Preliminary result)   Collection Time: 03-09-2016  7:18 PM  Result Value Ref Range Status   Specimen Description LEFT ANTECUBITAL  Final   Special Requests BOTTLES DRAWN AEROBIC AND ANAEROBIC 6CC  Final   Culture NO GROWTH < 12 HOURS  Final   Report Status PENDING  Incomplete  Blood culture (routine x 2)     Status: None (Preliminary result)   Collection Time: 2016-03-09  7:30 PM  Result Value Ref Range Status   Specimen Description BLOOD LEFT WRIST  Final   Special Requests BOTTLES DRAWN AEROBIC ONLY 4CC  Final   Culture NO GROWTH < 12 HOURS  Final   Report Status PENDING  Incomplete  MRSA PCR Screening     Status: None   Collection Time: 09-Mar-2016 11:55 PM  Result Value Ref Range Status   MRSA by PCR NEGATIVE NEGATIVE Final    Comment:        The GeneXpert MRSA Assay (FDA approved for NASAL specimens only), is one component of a comprehensive MRSA colonization surveillance  program. It is not intended to diagnose MRSA infection nor to guide or monitor treatment for MRSA infections.     Medical History: Past Medical History  Diagnosis Date  . Hypertension   . High cholesterol   . GERD (gastroesophageal reflux disease)   . Arthritis   . Anxiety   . Depression   . Cataract   . Hearing reduced   . PONV (postoperative nausea and vomiting)   . H/O left breast biopsy     NO CANCER  . Low back pain   . Chronic right hip pain   . Atrial fibrillation (HCC)   . Thyroid disease   . GAD (generalized anxiety disorder)   . Dysphagia     Medications:  Scheduled:  . sodium chloride   Intravenous STAT  . sodium chloride   Intravenous STAT  .  enoxaparin (LOVENOX) injection  30 mg Subcutaneous Q24H  . piperacillin-tazobactam (ZOSYN)  IV  2.25 g Intravenous Q8H  . [START ON 02/10/2016] vancomycin  1,500 mg Intravenous Q48H   Assessment: 82 yo female admitted from nursing facility for sepsis. Increased lactic acid. Originally came to ED due to hypotension. SCr elevated from baseline which fluctuates, but seems to be between 2 and 3 mg/dL.  Vancomycin 2 GM IV loading dose and Zosyn 2.25 GM IV given Corrected CrCl 10.5 ml/min  Goal of Therapy:  Vancomycin trough level 15-20 mcg/ml  Plan:  Vancomycin 1500 mg IV every 48 hours, next dose 02/10/16 at 2300 Zosyn 2.25 GM IV every 8 hours Vancomycin trough at steady state Monitor renal function Labs per protocol   Raquel JamesPittman, Janett Kamath Bennett 02/09/2016,8:05 AM

## 2016-02-09 NOTE — Progress Notes (Signed)
PT IS ASYMTOMATIC W/ LOW BP READING.

## 2016-02-09 NOTE — Consult Note (Signed)
Reason for Consult: Acute kidney injury superimposed on chronic Referring Physician: Dr. Rae Mar Michaela Tyler is an 82 y.o. female.  HPI: She is a patient was history of her chronic renal failure, CHF, history of atrial fibrillation and recently admitted to the hospital because of worsening of renal failure. Presently she was sent from [facility] because of hypotension. When she was evaluated she was found to have elevation of BUN and creatinine and serum admitted to the hospital for further workup. Presently patient is alert and she offers no complaints. Her blood pressure is slightly better. She denies any nausea or vomiting. Her main problem is difficulty in chewing because she didn't have her dentures. Patient also denies difficulty breathing.  Past Medical History  Diagnosis Date  . Hypertension   . High cholesterol   . GERD (gastroesophageal reflux disease)   . Arthritis   . Anxiety   . Depression   . Cataract   . Hearing reduced   . PONV (postoperative nausea and vomiting)   . H/O left breast biopsy     NO CANCER  . Low back pain   . Chronic right hip pain   . Atrial fibrillation ([place])   . Thyroid disease   . GAD (generalized anxiety disorder)   . Dysphagia     Past Surgical History  Procedure Laterality Date  . Total knee arthroplasty  2008    left, [place]  . Elbow surgery      left  . Abdominal hysterectomy    . Knee arthroscopy      right  . Breast lumpectomy  1998    left  . Cataract extraction w/phaco  03/23/2012    Procedure: CATARACT EXTRACTION PHACO AND INTRAOCULAR LENS PLACEMENT (IOC);  Surgeon: Elta Guadeloupe T. Gershon Crane, MD;  Location: [facility];  Service: Ophthalmology;  Laterality: Right;  CDE:12.73  . Cataract extraction w/phaco  04/20/2012    Procedure: CATARACT EXTRACTION PHACO AND INTRAOCULAR LENS PLACEMENT (IOC);  Surgeon: Elta Guadeloupe T. Gershon Crane, MD;  Location: [facility];  Service: Ophthalmology;  Laterality: Left;  CDE:7.70  . Yag laser application Left 02/20/8340     Procedure: YAG LASER APPLICATION;  Surgeon: Elta Guadeloupe T. Gershon Crane, MD;  Location: [facility];  Service: Ophthalmology;  Laterality: Left;  left  . Yag laser application Right 9/62/2297    Procedure: YAG LASER APPLICATION;  Surgeon: Elta Guadeloupe T. Gershon Crane, MD;  Location: [facility];  Service: Ophthalmology;  Laterality: Right;  right    Family History  Problem Relation Age of Onset  . Colon cancer Neg Hx   . Liver disease Neg Hx     Social History:  reports that she has never smoked. She has never used smokeless tobacco. She reports that she does not drink alcohol or use illicit drugs.  Allergies: No Known Allergies  Medications: I have reviewed the patient's current medications.  Results for orders placed or performed during the hospital encounter of 02/01/2016 (from the past 48 hour(s))  Lactic acid, plasma     Status: Abnormal   Collection Time: 01/29/2016  6:40 PM  Result Value Ref Range   Lactic Acid, Venous 3.9 (HH) 0.5 - 2.0 mmol/L    Comment: CRITICAL RESULT CALLED TO, READ BACK BY AND VERIFIED WITH: TURNER,C. AT 2017 ON 02/04/2016 BY AGUNDIZ,E.   CBC with Differential/Platelet     Status: Abnormal   Collection Time: 02/07/2016  7:18 PM  Result Value Ref Range   WBC 7.0 4.0 - 10.5 K/uL   RBC 4.19 3.87 -  5.11 MIL/uL   Hemoglobin 13.2 12.0 - 15.0 g/dL   HCT 40.5 36.0 - 46.0 %   MCV 96.7 78.0 - 100.0 fL   MCH 31.5 26.0 - 34.0 pg   MCHC 32.6 30.0 - 36.0 g/dL   RDW 18.7 (H) 11.5 - 15.5 %   Platelets 136 (L) 150 - 400 K/uL   Neutrophils Relative % 66 %   Neutro Abs 4.6 1.7 - 7.7 K/uL   Lymphocytes Relative 23 %   Lymphs Abs 1.6 0.7 - 4.0 K/uL   Monocytes Relative 9 %   Monocytes Absolute 0.6 0.1 - 1.0 K/uL   Eosinophils Relative 2 %   Eosinophils Absolute 0.1 0.0 - 0.7 K/uL   Basophils Relative 0 %   Basophils Absolute 0.0 0.0 - 0.1 K/uL  Urinalysis, Routine w reflex microscopic (not at [facility])     Status: Abnormal   Collection Time: 02/21/2016  7:18 PM  Result Value Ref Range   Color, Urine  YELLOW YELLOW   APPearance CLEAR CLEAR   Specific Gravity, Urine >1.030 (H) 1.005 - 1.030   pH 5.5 5.0 - 8.0   Glucose, UA NEGATIVE NEGATIVE mg/dL   Hgb urine dipstick NEGATIVE NEGATIVE   Bilirubin Urine NEGATIVE NEGATIVE   Ketones, ur NEGATIVE NEGATIVE mg/dL   Protein, ur NEGATIVE NEGATIVE mg/dL   Nitrite NEGATIVE NEGATIVE   Leukocytes, UA NEGATIVE NEGATIVE    Comment: MICROSCOPIC NOT DONE ON URINES WITH NEGATIVE PROTEIN, BLOOD, LEUKOCYTES, NITRITE, OR GLUCOSE <1000 mg/dL.  Blood culture (routine x 2)     Status: None (Preliminary result)   Collection Time: 02/22/2016  7:18 PM  Result Value Ref Range   Specimen Description LEFT ANTECUBITAL    Special Requests BOTTLES DRAWN AEROBIC AND ANAEROBIC 6CC    Culture NO GROWTH < 12 HOURS    Report Status PENDING   Comprehensive metabolic panel     Status: Abnormal   Collection Time: 02/22/2016  7:18 PM  Result Value Ref Range   Sodium 132 (L) 135 - 145 mmol/L   Potassium 5.2 (H) 3.5 - 5.1 mmol/L    Comment: DELTA CHECK NOTED   Chloride 94 (L) 101 - 111 mmol/L   CO2 24 22 - 32 mmol/L   Glucose, Bld 90 65 - 99 mg/dL   BUN 65 (H) 6 - 20 mg/dL   Creatinine, Ser 3.85 (H) 0.44 - 1.00 mg/dL    Comment: DELTA CHECK NOTED   Calcium 8.2 (L) 8.9 - 10.3 mg/dL   Total Protein 6.6 6.5 - 8.1 g/dL   Albumin 3.1 (L) 3.5 - 5.0 g/dL   AST 27 15 - 41 U/L   ALT 27 14 - 54 U/L   Alkaline Phosphatase 117 38 - 126 U/L   Total Bilirubin 1.9 (H) 0.3 - 1.2 mg/dL   GFR calc non Af Amer 9 (L) >60 mL/min   GFR calc Af Amer 11 (L) >60 mL/min    Comment: (NOTE) The eGFR has been calculated using the CKD EPI equation. This calculation has not been validated in all clinical situations. eGFR's persistently <60 mL/min signify possible Chronic Kidney Disease.    Anion gap 14 5 - 15  Procalcitonin - Baseline     Status: None   Collection Time: 02/07/2016  7:18 PM  Result Value Ref Range   Procalcitonin 0.47 ng/mL  Blood culture (routine x 2)     Status: None  (Preliminary result)   Collection Time: 02/13/2016  7:30 PM  Result Value Ref Range  Specimen Description BLOOD LEFT WRIST    Special Requests BOTTLES DRAWN AEROBIC ONLY 4CC    Culture NO GROWTH < 12 HOURS    Report Status PENDING   Lactic acid, plasma     Status: Abnormal   Collection Time: 01/31/2016  9:05 PM  Result Value Ref Range   Lactic Acid, Venous 3.8 (HH) 0.5 - 2.0 mmol/L    Comment: CRITICAL RESULT CALLED TO, READ BACK BY AND VERIFIED WITH:  HAYMORE,R @ 2204 ON 02/18/2016 BY WOODIE,J   Troponin I     Status: Abnormal   Collection Time: 02/17/2016 10:50 PM  Result Value Ref Range   Troponin I 0.16 (H) <0.031 ng/mL    Comment:        PERSISTENTLY INCREASED TROPONIN VALUES IN THE RANGE OF 0.04-0.49 ng/mL CAN BE SEEN IN:       -UNSTABLE ANGINA       -CONGESTIVE HEART FAILURE       -MYOCARDITIS       -CHEST TRAUMA       -ARRYHTHMIAS       -LATE PRESENTING MYOCARDIAL INFARCTION       -COPD   CLINICAL FOLLOW-UP RECOMMENDED.   MRSA PCR Screening     Status: None   Collection Time: 02/07/2016 11:55 PM  Result Value Ref Range   MRSA by PCR NEGATIVE NEGATIVE    Comment:        The GeneXpert MRSA Assay (FDA approved for NASAL specimens only), is one component of a comprehensive MRSA colonization surveillance program. It is not intended to diagnose MRSA infection nor to guide or monitor treatment for MRSA infections.   Lactic acid, plasma     Status: Abnormal   Collection Time: 02/09/16  1:25 AM  Result Value Ref Range   Lactic Acid, Venous 3.3 (HH) 0.5 - 2.0 mmol/L    Comment: CRITICAL RESULT CALLED TO, READ BACK BY AND VERIFIED WITH:  WAGONER,R @ 0230 ON 02/09/16 BY WOODIE,J   Protime-INR     Status: Abnormal   Collection Time: 02/09/16  1:25 AM  Result Value Ref Range   Prothrombin Time 18.4 (H) 11.6 - 15.2 seconds   INR 1.52 (H) 0.00 - 1.49  APTT     Status: None   Collection Time: 02/09/16  1:25 AM  Result Value Ref Range   aPTT 36 24 - 37 seconds  Lactic acid,  plasma     Status: Abnormal   Collection Time: 02/09/16  4:12 AM  Result Value Ref Range   Lactic Acid, Venous 3.3 (HH) 0.5 - 2.0 mmol/L    Comment: CRITICAL RESULT CALLED TO, READ BACK BY AND VERIFIED WITH: HEARN J. AT 0610A ON [id number] BY THOMPSON S.   Basic metabolic panel     Status: Abnormal   Collection Time: 02/09/16  4:12 AM  Result Value Ref Range   Sodium 134 (L) 135 - 145 mmol/L   Potassium 4.7 3.5 - 5.1 mmol/L   Chloride 99 (L) 101 - 111 mmol/L   CO2 22 22 - 32 mmol/L   Glucose, Bld 99 65 - 99 mg/dL   BUN 62 (H) 6 - 20 mg/dL   Creatinine, Ser 3.78 (H) 0.44 - 1.00 mg/dL   Calcium 7.8 (L) 8.9 - 10.3 mg/dL   GFR calc non Af Amer 9 (L) >60 mL/min   GFR calc Af Amer 11 (L) >60 mL/min    Comment: (NOTE) The eGFR has been calculated using the CKD EPI equation. This calculation has not  been validated in all clinical situations. eGFR's persistently <60 mL/min signify possible Chronic Kidney Disease.    Anion gap 13 5 - 15  CBC     Status: Abnormal   Collection Time: 02/09/16  4:12 AM  Result Value Ref Range   WBC 7.2 4.0 - 10.5 K/uL   RBC 3.93 3.87 - 5.11 MIL/uL   Hemoglobin 12.6 12.0 - 15.0 g/dL   HCT 38.4 36.0 - 46.0 %   MCV 97.7 78.0 - 100.0 fL   MCH 32.1 26.0 - 34.0 pg   MCHC 32.8 30.0 - 36.0 g/dL   RDW 18.7 (H) 11.5 - 15.5 %   Platelets 149 (L) 150 - 400 K/uL    Dg Chest Portable 1 View  01/28/2016  CLINICAL DATA:  Hypotension, dyspnea. EXAM: PORTABLE CHEST 1 VIEW COMPARISON:  02/01/2016 FINDINGS: Moderate cardiomegaly, unchanged. Elevation of the right hemidiaphragm, unchanged. Mildly prominent hilar contours, likely vascular. The lungs are clear. No large effusions. No pneumothorax. IMPRESSION: Cardiomegaly. Prominent central pulmonary vasculature, possibly related to pulmonary arterial hypertension. No acute cardiopulmonary findings. Electronically Signed   By: Andreas Newport M.D.   On: 01/28/2016 21:05    Review of Systems  Constitutional: Negative for fever  and chills.       Complaints of difficulty in chewing and she is asking for her dentures  Respiratory: Negative for shortness of breath.   Cardiovascular: Negative for orthopnea.  Gastrointestinal: Negative for nausea and vomiting.  Neurological: Positive for weakness.   Blood pressure 79/60, pulse 113, temperature 97.1 F (36.2 C), temperature source Oral, resp. rate 19, height 5' 11"  (1.803 m), weight 107.956 kg (238 lb), SpO2 93 %. Physical Exam  Constitutional: No distress.  Eyes: No scleral icterus.  Neck: No JVD present.  Cardiovascular: Normal rate.   Respiratory: No respiratory distress. She has no wheezes.  GI: There is no tenderness. There is no rebound.  Musculoskeletal: She exhibits no edema.  Neurological: She is alert.    Assessment/Plan: Problem #1 acute kidney injury: Most likely secondary to prerenal associated with diuretics/ACE inhibitor. At this moment patient offers no complaints. Problem #2 chronic renal failure: Stage IV. Etiology was thought to be secondary to cardiorenal/recurrent acute kidney injury/age-related renal function loss/hypertension. Problem #3 hypotension: Most likely from dehydration and ACE inhibitor. Patient was hydrated her blood pressure is still low normal. Problem #4 history of paroxysmal supraventricular tachycardia Problem #5 history of CHF: Presently she doesn't have any sign of fluid overload Problem #6 dementia Problem #7 history of coronary artery disease Plan: We'll continue his hydration. We'll check renal panel in the morning.  Halleigh Comes S 02/09/2016, 8:56 AM

## 2016-02-09 NOTE — Progress Notes (Signed)
Subjective: Patient was admitted due to hypotension as possible case of sepsis. She is on IV antibiotics and IV fluid. Patien feels slightly better today. No fever.  Objective: Vital signs in last 24 hours: Temp:  [97.1 F (36.2 C)-98.7 F (37.1 C)] 97.1 F (36.2 C) (04/15 0700) Pulse Rate:  [70-122] 113 (04/15 0700) Resp:  [16-35] 19 (04/15 0700) BP: (61-127)/(40-109) 79/60 mmHg (04/15 0700) SpO2:  [86 %-99 %] 93 % (04/15 0700) FiO2 (%):  [0 %] 0 % (04/14 2355) Weight:  [107.956 kg (238 lb)] 107.956 kg (238 lb) (04/14 1804) Weight change:  Last BM Date: 02/09/16  Intake/Output from previous day: 04/14 0701 - 04/15 0700 In: 4720.8 [I.V.:4720.8] Out: 100 [Urine:100]  PHYSICAL EXAM General appearance: alert and no distress Resp: diminished breath sounds bilaterally and rhonchi bilaterally Cardio: irregularly irregular rhythm GI: soft, non-tender; bowel sounds normal; no masses,  no organomegaly Extremities: edema 2++ edema  Lab Results:  Results for orders placed or performed during the hospital encounter of 02/13/2016 (from the past 48 hour(s))  Lactic acid, plasma     Status: Abnormal   Collection Time: 02/12/2016  6:40 PM  Result Value Ref Range   Lactic Acid, Venous 3.9 (HH) 0.5 - 2.0 mmol/L    Comment: CRITICAL RESULT CALLED TO, READ BACK BY AND VERIFIED WITH: TURNER,C. AT 2017 ON 02/01/2016 BY AGUNDIZ,E.   CBC with Differential/Platelet     Status: Abnormal   Collection Time: 02/05/2016  7:18 PM  Result Value Ref Range   WBC 7.0 4.0 - 10.5 K/uL   RBC 4.19 3.87 - 5.11 MIL/uL   Hemoglobin 13.2 12.0 - 15.0 g/dL   HCT 40.5 36.0 - 46.0 %   MCV 96.7 78.0 - 100.0 fL   MCH 31.5 26.0 - 34.0 pg   MCHC 32.6 30.0 - 36.0 g/dL   RDW 18.7 (H) 11.5 - 15.5 %   Platelets 136 (L) 150 - 400 K/uL   Neutrophils Relative % 66 %   Neutro Abs 4.6 1.7 - 7.7 K/uL   Lymphocytes Relative 23 %   Lymphs Abs 1.6 0.7 - 4.0 K/uL   Monocytes Relative 9 %   Monocytes Absolute 0.6 0.1 - 1.0 K/uL    Eosinophils Relative 2 %   Eosinophils Absolute 0.1 0.0 - 0.7 K/uL   Basophils Relative 0 %   Basophils Absolute 0.0 0.0 - 0.1 K/uL  Urinalysis, Routine w reflex microscopic (not at [facility])     Status: Abnormal   Collection Time: 02/06/2016  7:18 PM  Result Value Ref Range   Color, Urine YELLOW YELLOW   APPearance CLEAR CLEAR   Specific Gravity, Urine >1.030 (H) 1.005 - 1.030   pH 5.5 5.0 - 8.0   Glucose, UA NEGATIVE NEGATIVE mg/dL   Hgb urine dipstick NEGATIVE NEGATIVE   Bilirubin Urine NEGATIVE NEGATIVE   Ketones, ur NEGATIVE NEGATIVE mg/dL   Protein, ur NEGATIVE NEGATIVE mg/dL   Nitrite NEGATIVE NEGATIVE   Leukocytes, UA NEGATIVE NEGATIVE    Comment: MICROSCOPIC NOT DONE ON URINES WITH NEGATIVE PROTEIN, BLOOD, LEUKOCYTES, NITRITE, OR GLUCOSE <1000 mg/dL.  Blood culture (routine x 2)     Status: None (Preliminary result)   Collection Time: 02/22/2016  7:18 PM  Result Value Ref Range   Specimen Description LEFT ANTECUBITAL    Special Requests BOTTLES DRAWN AEROBIC AND ANAEROBIC 6CC    Culture NO GROWTH < 12 HOURS    Report Status PENDING   Comprehensive metabolic panel     Status: Abnormal  Collection Time: 02/07/2016  7:18 PM  Result Value Ref Range   Sodium 132 (L) 135 - 145 mmol/L   Potassium 5.2 (H) 3.5 - 5.1 mmol/L    Comment: DELTA CHECK NOTED   Chloride 94 (L) 101 - 111 mmol/L   CO2 24 22 - 32 mmol/L   Glucose, Bld 90 65 - 99 mg/dL   BUN 65 (H) 6 - 20 mg/dL   Creatinine, Ser 3.85 (H) 0.44 - 1.00 mg/dL    Comment: DELTA CHECK NOTED   Calcium 8.2 (L) 8.9 - 10.3 mg/dL   Total Protein 6.6 6.5 - 8.1 g/dL   Albumin 3.1 (L) 3.5 - 5.0 g/dL   AST 27 15 - 41 U/L   ALT 27 14 - 54 U/L   Alkaline Phosphatase 117 38 - 126 U/L   Total Bilirubin 1.9 (H) 0.3 - 1.2 mg/dL   GFR calc non Af Amer 9 (L) >60 mL/min   GFR calc Af Amer 11 (L) >60 mL/min    Comment: (NOTE) The eGFR has been calculated using the CKD EPI equation. This calculation has not been validated in all clinical  situations. eGFR's persistently <60 mL/min signify possible Chronic Kidney Disease.    Anion gap 14 5 - 15  Procalcitonin - Baseline     Status: None   Collection Time: 01/28/2016  7:18 PM  Result Value Ref Range   Procalcitonin 0.47 ng/mL  Blood culture (routine x 2)     Status: None (Preliminary result)   Collection Time: 02/22/2016  7:30 PM  Result Value Ref Range   Specimen Description BLOOD LEFT WRIST    Special Requests BOTTLES DRAWN AEROBIC ONLY 4CC    Culture NO GROWTH < 12 HOURS    Report Status PENDING   Lactic acid, plasma     Status: Abnormal   Collection Time: 02/07/2016  9:05 PM  Result Value Ref Range   Lactic Acid, Venous 3.8 (HH) 0.5 - 2.0 mmol/L    Comment: CRITICAL RESULT CALLED TO, READ BACK BY AND VERIFIED WITH:  HAYMORE,R @ 2204 ON 02/20/2016 BY WOODIE,J   Troponin I     Status: Abnormal   Collection Time: 02/05/2016 10:50 PM  Result Value Ref Range   Troponin I 0.16 (H) <0.031 ng/mL    Comment:        PERSISTENTLY INCREASED TROPONIN VALUES IN THE RANGE OF 0.04-0.49 ng/mL CAN BE SEEN IN:       -UNSTABLE ANGINA       -CONGESTIVE HEART FAILURE       -MYOCARDITIS       -CHEST TRAUMA       -ARRYHTHMIAS       -LATE PRESENTING MYOCARDIAL INFARCTION       -COPD   CLINICAL FOLLOW-UP RECOMMENDED.   MRSA PCR Screening     Status: None   Collection Time: 02/23/2016 11:55 PM  Result Value Ref Range   MRSA by PCR NEGATIVE NEGATIVE    Comment:        The GeneXpert MRSA Assay (FDA approved for NASAL specimens only), is one component of a comprehensive MRSA colonization surveillance program. It is not intended to diagnose MRSA infection nor to guide or monitor treatment for MRSA infections.   Lactic acid, plasma     Status: Abnormal   Collection Time: 02/09/16  1:25 AM  Result Value Ref Range   Lactic Acid, Venous 3.3 (HH) 0.5 - 2.0 mmol/L    Comment: CRITICAL RESULT CALLED TO, READ BACK BY AND  VERIFIED WITH:  WAGONER,R @ 0230 ON 02/09/16 BY WOODIE,J    Protime-INR     Status: Abnormal   Collection Time: 02/09/16  1:25 AM  Result Value Ref Range   Prothrombin Time 18.4 (H) 11.6 - 15.2 seconds   INR 1.52 (H) 0.00 - 1.49  APTT     Status: None   Collection Time: 02/09/16  1:25 AM  Result Value Ref Range   aPTT 36 24 - 37 seconds  Lactic acid, plasma     Status: Abnormal   Collection Time: 02/09/16  4:12 AM  Result Value Ref Range   Lactic Acid, Venous 3.3 (HH) 0.5 - 2.0 mmol/L    Comment: CRITICAL RESULT CALLED TO, READ BACK BY AND VERIFIED WITH: HEARN J. AT 0610A ON [id number] BY THOMPSON S.   Basic metabolic panel     Status: Abnormal   Collection Time: 02/09/16  4:12 AM  Result Value Ref Range   Sodium 134 (L) 135 - 145 mmol/L   Potassium 4.7 3.5 - 5.1 mmol/L   Chloride 99 (L) 101 - 111 mmol/L   CO2 22 22 - 32 mmol/L   Glucose, Bld 99 65 - 99 mg/dL   BUN 62 (H) 6 - 20 mg/dL   Creatinine, Ser 3.78 (H) 0.44 - 1.00 mg/dL   Calcium 7.8 (L) 8.9 - 10.3 mg/dL   GFR calc non Af Amer 9 (L) >60 mL/min   GFR calc Af Amer 11 (L) >60 mL/min    Comment: (NOTE) The eGFR has been calculated using the CKD EPI equation. This calculation has not been validated in all clinical situations. eGFR's persistently <60 mL/min signify possible Chronic Kidney Disease.    Anion gap 13 5 - 15  CBC     Status: Abnormal   Collection Time: 02/09/16  4:12 AM  Result Value Ref Range   WBC 7.2 4.0 - 10.5 K/uL   RBC 3.93 3.87 - 5.11 MIL/uL   Hemoglobin 12.6 12.0 - 15.0 g/dL   HCT 38.4 36.0 - 46.0 %   MCV 97.7 78.0 - 100.0 fL   MCH 32.1 26.0 - 34.0 pg   MCHC 32.8 30.0 - 36.0 g/dL   RDW 18.7 (H) 11.5 - 15.5 %   Platelets 149 (L) 150 - 400 K/uL    ABGS No results for input(s): PHART, PO2ART, TCO2, HCO3 in the last 72 hours.  Invalid input(s): PCO2 CULTURES Recent Results (from the past 240 hour(s))  Blood culture (routine x 2)     Status: None (Preliminary result)   Collection Time: 02/03/2016  7:18 PM  Result Value Ref Range Status   Specimen  Description LEFT ANTECUBITAL  Final   Special Requests BOTTLES DRAWN AEROBIC AND ANAEROBIC 6CC  Final   Culture NO GROWTH < 12 HOURS  Final   Report Status PENDING  Incomplete  Blood culture (routine x 2)     Status: None (Preliminary result)   Collection Time: 02/12/2016  7:30 PM  Result Value Ref Range Status   Specimen Description BLOOD LEFT WRIST  Final   Special Requests BOTTLES DRAWN AEROBIC ONLY 4CC  Final   Culture NO GROWTH < 12 HOURS  Final   Report Status PENDING  Incomplete  MRSA PCR Screening     Status: None   Collection Time: 02/21/2016 11:55 PM  Result Value Ref Range Status   MRSA by PCR NEGATIVE NEGATIVE Final    Comment:        The GeneXpert MRSA Assay (FDA approved for  NASAL specimens only), is one component of a comprehensive MRSA colonization surveillance program. It is not intended to diagnose MRSA infection nor to guide or monitor treatment for MRSA infections.    Studies/Results: Dg Chest Portable 1 View  02/05/2016  CLINICAL DATA:  Hypotension, dyspnea. EXAM: PORTABLE CHEST 1 VIEW COMPARISON:  02/01/2016 FINDINGS: Moderate cardiomegaly, unchanged. Elevation of the right hemidiaphragm, unchanged. Mildly prominent hilar contours, likely vascular. The lungs are clear. No large effusions. No pneumothorax. IMPRESSION: Cardiomegaly. Prominent central pulmonary vasculature, possibly related to pulmonary arterial hypertension. No acute cardiopulmonary findings. Electronically Signed   By: Andreas Newport M.D.   On: 02/18/2016 21:05    Medications: I have reviewed the patient's current medications.  Assesment:   Principal Problem:   Sepsis ([place]) Active Problems:   Chronic atrial fibrillation (HCC)   AKI (acute kidney injury) (HCC)   Paroxysmal supraventricular tachycardia (HCC)   Hypotension   Dementia   CKD (chronic kidney disease)   Lactic acid acidosis   CHF, chronic (HCC)   DNR (do not resuscitate)   Decubitus skin ulcer   Acute on chronic renal  insufficiency ([place])    Plan:  Medications reviewed Continue IV antibiotics Continue IV fluid Will monitor CBC/BMP Will do nephrology consult    LOS: 1 day   , 02/09/2016, 8:33 AM

## 2016-02-09 NOTE — H&P (Signed)
PCP:   FANTA,TESFAYE, MD   Chief Complaint:  Low bp  HPI: 82 yo female dementia, chronic chf, htn, afib sent in from [facility] center for low blood pressure.  Pt cannot provide history due to her dementia.  No report of fever.  All info from ed staff and records.  Pt bp persistenly low and sepsis code was initiated.  Dr Patria Mane discussed with husband who verified she is DNR, he wants no pressors but agreeable to abx and ivf.  Pt has been given over 4 liters ivf bolus and her sbp persistent in the 80s but pt is mentating pretty well and in no distress.  Pt referred for admission for possible sepsis.  Review of Systems:  Unobtainable from pt due to dementia  Past Medical History: Past Medical History  Diagnosis Date  . Hypertension   . High cholesterol   . GERD (gastroesophageal reflux disease)   . Arthritis   . Anxiety   . Depression   . Cataract   . Hearing reduced   . PONV (postoperative nausea and vomiting)   . H/O left breast biopsy     NO CANCER  . Low back pain   . Chronic right hip pain   . Atrial fibrillation (HCC)   . Thyroid disease   . GAD (generalized anxiety disorder)   . Dysphagia    Past Surgical History  Procedure Laterality Date  . Total knee arthroplasty  2008    left, [facility]  . Elbow surgery      left  . Abdominal hysterectomy    . Knee arthroscopy      right  . Breast lumpectomy  1998    left  . Cataract extraction w/phaco  03/23/2012    Procedure: CATARACT EXTRACTION PHACO AND INTRAOCULAR LENS PLACEMENT (IOC);  Surgeon: Loraine Leriche T. Nile Riggs, MD;  Location: [facility];  Service: Ophthalmology;  Laterality: Right;  CDE:12.73  . Cataract extraction w/phaco  04/20/2012    Procedure: CATARACT EXTRACTION PHACO AND INTRAOCULAR LENS PLACEMENT (IOC);  Surgeon: Loraine Leriche T. Nile Riggs, MD;  Location: [facility];  Service: Ophthalmology;  Laterality: Left;  CDE:7.70  . Yag laser application Left 11/07/2014    Procedure: YAG LASER APPLICATION;  Surgeon: Loraine Leriche T. Nile Riggs, MD;  Location: [facility];  Service: Ophthalmology;  Laterality: Left;  left  . Yag laser application Right 11/21/2014    Procedure: YAG LASER APPLICATION;  Surgeon: Loraine Leriche T. Nile Riggs, MD;  Location: [facility];  Service: Ophthalmology;  Laterality: Right;  right    Medications: Prior to Admission medications   Medication Sig Start Date End Date Taking? Authorizing Provider  ALPRAZolam (XANAX) 0.25 MG tablet Take 1 tablet (0.25 mg total) by mouth 2 (two) times daily as needed for anxiety. 01/29/16  Yes Avon Gully, MD  aspirin EC 81 MG tablet Take 81 mg by mouth daily. Monitor for s/s of bleeding   Yes Historical Provider, MD  citalopram (CELEXA) 10 MG tablet Take 10 mg by mouth at bedtime.  09/15/14  Yes Historical Provider, MD  ENSURE (ENSURE) Take 237 mLs by mouth daily.   Yes Historical Provider, MD  ferrous sulfate 325 (65 FE) MG tablet Take 325 mg by mouth daily with breakfast.   Yes Historical Provider, MD  levothyroxine (SYNTHROID, LEVOTHROID) 88 MCG tablet Take 1 tablet (88 mcg total) by mouth daily. 01/17/16  Yes Pecola Lawless, MD  losartan (COZAAR) 25 MG tablet Take 25 mg by mouth daily.   Yes Historical Provider, MD  magnesium oxide (MAG-OX)  400 (241.3 Mg) MG tablet Take 1 tablet (400 mg total) by mouth 2 (two) times daily. 01/08/16  Yes Avon Gully, MD  Metoprolol Tartrate 75 MG TABS Take 1 tablet by mouth twice a day   Yes Historical Provider, MD  omeprazole (PRILOSEC) 20 MG capsule Take 20 mg by mouth daily. 10/07/14  Yes Historical Provider, MD  potassium chloride (K-DUR) 10 MEQ tablet Take 20 mEq by mouth daily.   Yes Historical Provider, MD  promethazine (PHENERGAN) 25 MG tablet Take 12.5 mg by mouth every 6 (six) hours as needed for nausea or vomiting.   Yes Historical Provider, MD  torsemide (DEMADEX) 20 MG tablet Take 2 tablets (40 mg total) by mouth daily. 01/29/16  Yes Avon Gully, MD  traMADol (ULTRAM) 50 MG tablet Take 1 tablet (50 mg total) by mouth every 12 (twelve) hours as needed. 01/29/16  Yes  Avon Gully, MD  Vitamin D, Ergocalciferol, (DRISDOL) 50000 units CAPS capsule Take 1 capsule (50,000 Units total) by mouth every 7 (seven) days. 01/29/16  Yes Avon Gully, MD    Allergies:  No Known Allergies  Social History:  reports that she has never smoked. She has never used smokeless tobacco. She reports that she does not drink alcohol or use illicit drugs.  Family History: Family History  Problem Relation Age of Onset  . Colon cancer Neg Hx   . Liver disease Neg Hx     Physical Exam: Filed Vitals:   Feb 13, 2016 2200 02/13/16 2231 2016/02/13 2245 02-13-2016 2330  BP: 72/52  Pulse:    122  Temp:      TempSrc:      Resp: Height:      Weight:      SpO2:   96% 97%   General appearance: alert, cooperative and no distress Head: Normocephalic, without obvious abnormality, atraumatic Eyes: negative Nose: Nares normal. Septum midline. Mucosa normal. No drainage or sinus tenderness. Neck: no JVD and supple, symmetrical, trachea midline Lungs: clear to auscultation bilaterally mild rales at left base Heart: regular rate and rhythm, S1, S2 normal, no murmur, click, rub or gallop Abdomen: soft, non-tender; bowel sounds normal; no masses,  no organomegaly Extremities: extremities normal, atraumatic, no cyanosis or edema Pulses: 2+ and symmetric Skin: Skin color, texture, turgor normal. No rashes or lesions  No ulcers on sacral area Neurologic: Grossly normal    Labs on Admission:   Recent Labs  02/07/16 0720 02-13-2016 1918  NA 137 132*  K 4.1 5.2*  CL 96* 94*  CO2 30 24  GLUCOSE 93 90  BUN 59* 65*  CREATININE 2.71* 3.85*  CALCIUM 8.1* 8.2*  MG 1.5*  --     Recent Labs  02/13/2016 1918  AST 27  ALT 27  ALKPHOS 117  BILITOT 1.9*  PROT 6.6  ALBUMIN 3.1*    Recent Labs  2016-02-13 1918  WBC 7.0  NEUTROABS 4.6  HGB 13.2  HCT 40.5  MCV 96.7  PLT 136*    Recent Labs  Feb 13, 2016 2250  TROPONINI 0.16*   Radiological Exams on  Admission:  Dg Chest Portable 1 View  2016-02-13  CLINICAL DATA:  Hypotension, dyspnea. EXAM: PORTABLE CHEST 1 VIEW COMPARISON:  02/01/2016 FINDINGS: Moderate cardiomegaly, unchanged. Elevation of the right hemidiaphragm, unchanged. Mildly prominent hilar contours, likely vascular. The lungs are clear. No large effusions. No pneumothorax. IMPRESSION: Cardiomegaly. Prominent central pulmonary vasculature, possibly related to pulmonary arterial hypertension. No acute cardiopulmonary findings. Electronically Signed  By: Ellery Plunkaniel R Mitchell M.D.   On: 2016-01-09 21:05   Old chart reviewed cxr reviewed no edema or infiltrate  Assessment/Plan  82 yo female with hypotension, tachycardia, lactic acidosis from possible sepsis  Principal Problem:   Sepsis (HCC)- ua clean and cxr is neg.  Blood cx obtained.  This could be from dehydration/in setting of cont multiple bp meds.  procalcitonin level not significantly elevated so reassuring.  Pt doing quite well despite her hypotension.  Has been given almost 5 liters of ivf bolus, will stop bolus for now and place on ns at 125 cc/hour.  No pressors per husband wishes .  Cover with vanc/zosyn.    Active Problems:   Chronic atrial fibrillation (HCC)- noted, sinus tachycardia at this time.     AKI (acute kidney injury) (HCC)- baseline is around 2.5 , up to 3.8.  Ivf.  Place foley cath.   Paroxysmal supraventricular tachycardia (HCC)- noted   Hypotension- as above   Dementia- noted   CKD (chronic kidney disease)- with AKI, ivf   Lactic acid acidosis- serial q 3 hours.     CHF, chronic (HCC)- holding diuretics in above setting.  May need to institute in the next 12 - 48 hours due to the amount of fluids she has gotten   DNR (do not resuscitate)- noted    Admit to stepdown.  DNR.  Cassiopeia Florentino A 02/09/2016, 12:12 AM

## 2016-02-09 NOTE — Progress Notes (Signed)
ANTIBIOTIC CONSULT NOTE-Preliminary  Pharmacy Consult for vancomycin and zosyn Indication: sepsis  No Known Allergies  Patient Measurements: Height:  (180.3 cm) Weight: 238 lb (107.956 kg) IBW/kg (Calculated) : 70.8 Adjusted Body Weight:  Vital Signs: Temp: 98.6 F (37 C) (04/14 2104) Temp Source: Rectal (04/14 2104) BP: 80/66 mmHg (04/15 0007) Pulse Rate: 122 (04/15 0007)  Labs:  Recent Labs  02/07/16 0720 03-07-16 1918  WBC  --  7.0  HGB  --  13.2  PLT  --  136*  CREATININE 2.71* 3.85*    Estimated Creatinine Clearance: 12.6 mL/min (by C-G formula based on Cr of 3.85).  No results for input(s): VANCOTROUGH, VANCOPEAK, VANCORANDOM, GENTTROUGH, GENTPEAK, GENTRANDOM, TOBRATROUGH, TOBRAPEAK, TOBRARND, AMIKACINPEAK, AMIKACINTROU, AMIKACIN in the last 72 hours.   Microbiology: Recent Results (from the past 720 hour(s))  Urine culture     Status: None   Collection Time: 01/22/16  2:15 PM  Result Value Ref Range Status   Specimen Description URINE, CATHETERIZED  Final   Special Requests NONE  Final   Culture   Final    >=100,000 COLONIES/mL ESCHERICHIA COLI Performed at [facility]    Report Status 01/25/2016 FINAL  Final   Organism ID, Bacteria ESCHERICHIA COLI  Final      Susceptibility   Escherichia coli - MIC*    AMPICILLIN >=32 RESISTANT Resistant     CEFAZOLIN <=4 SENSITIVE Sensitive     CEFTRIAXONE <=1 SENSITIVE Sensitive     CIPROFLOXACIN >=4 RESISTANT Resistant     GENTAMICIN >=16 RESISTANT Resistant     IMIPENEM <=0.25 SENSITIVE Sensitive     NITROFURANTOIN <=16 SENSITIVE Sensitive     TRIMETH/SULFA <=20 SENSITIVE Sensitive     AMPICILLIN/SULBACTAM 16 INTERMEDIATE Intermediate     PIP/TAZO <=4 SENSITIVE Sensitive     * >=100,000 COLONIES/mL ESCHERICHIA COLI  Urine culture     Status: None   Collection Time: 01/26/16  9:02 PM  Result Value Ref Range Status   Specimen Description URINE, CATHETERIZED  Final   Special Requests NONE  Final    Culture   Final    NO GROWTH 2 DAYS Performed at [facility]    Report Status 01/29/2016 FINAL  Final  Blood culture (routine x 2)     Status: None (Preliminary result)   Collection Time: 07-Mar-2016  7:18 PM  Result Value Ref Range Status   Specimen Description LEFT ANTECUBITAL  Final   Special Requests BOTTLES DRAWN AEROBIC AND ANAEROBIC 6CC  Final   Culture PENDING  Incomplete   Report Status PENDING  Incomplete  Blood culture (routine x 2)     Status: None (Preliminary result)   Collection Time: 03-07-2016  7:30 PM  Result Value Ref Range Status   Specimen Description BLOOD LEFT WRIST  Final   Special Requests BOTTLES DRAWN AEROBIC ONLY 4CC  Final   Culture PENDING  Incomplete   Report Status PENDING  Incomplete    Medical History: Past Medical History  Diagnosis Date  . Hypertension   . High cholesterol   . GERD (gastroesophageal reflux disease)   . Arthritis   . Anxiety   . Depression   . Cataract   . Hearing reduced   . PONV (postoperative nausea and vomiting)   . H/O left breast biopsy     NO CANCER  . Low back pain   . Chronic right hip pain   . Atrial fibrillation (HCC)   . Thyroid disease   . GAD (generalized anxiety  disorder)   . Dysphagia     Medications:  Scheduled:  . sodium chloride   Intravenous STAT  . sodium chloride   Intravenous STAT  . enoxaparin (LOVENOX) injection  30 mg Subcutaneous Q24H  . piperacillin-tazobactam (ZOSYN)  IV  2.25 g Intravenous Once  . vancomycin  1,000 mg Intravenous Once   Infusions:  . sodium chloride 1,000 mL (02/16/2016 1925)   PRN: ALPRAZolam, morphine injection, ondansetron **OR** ondansetron (ZOFRAN) IV Anti-infectives    Start     Dose/Rate Route Frequency Ordered Stop   02/09/16 0430  piperacillin-tazobactam (ZOSYN) IVPB 2.25 g     2.25 g 100 mL/hr over 30 Minutes Intravenous  Once 02/09/16 0023     02/09/16 0000  piperacillin-tazobactam (ZOSYN) IVPB 3.375 g  Status:  Discontinued     3.375 g 100  mL/hr over 30 Minutes Intravenous  Once 02/04/2016 2355 02/09/16 0018   02/09/16 0000  vancomycin (VANCOCIN) IVPB 1000 mg/200 mL premix     1,000 mg 200 mL/hr over 60 Minutes Intravenous  Once 02/18/2016 2355     01/27/2016 2030  vancomycin (VANCOCIN) IVPB 1000 mg/200 mL premix     1,000 mg 200 mL/hr over 60 Minutes Intravenous  Once 01/26/2016 2029 02/13/2016 2331   01/26/2016 2030  piperacillin-tazobactam (ZOSYN) IVPB 3.375 g     3.375 g 100 mL/hr over 30 Minutes Intravenous  Once 02/07/2016 2029 01/28/2016 2118      Assessment: 82 yo female admitted from nursing facility for sepsis. Increased lactic acid. Originally came to ED due to  hypotension. SCr elevated from baseline which fluctuates, but seems to be between 2 and 3 mg/dL.   Goal of Therapy:  Vancomycin trough level 15-20 mcg/ml  Plan:  Preliminary review of pertinent patient information completed.  Protocol will be initiated with a one-time dose(s) of zosyn 2.25 grams x 1 at 0430, 8 hours after dose in ED given.  2 grams of vancomycin have been given for loading dose in pt (>100kg) so no additional dosing needed at this time.  [name] clinical pharmacist will complete review during morning rounds to assess patient and finalize treatment regimen.  Loyola MastMidkiff, Wylder Macomber Scarlett, RPH 02/09/2016,12:25 AM

## 2016-02-10 LAB — CBC
HEMATOCRIT: 39.1 % (ref 36.0–46.0)
HEMOGLOBIN: 12.7 g/dL (ref 12.0–15.0)
MCH: 31.5 pg (ref 26.0–34.0)
MCHC: 32.5 g/dL (ref 30.0–36.0)
MCV: 97 fL (ref 78.0–100.0)
Platelets: 138 10*3/uL — ABNORMAL LOW (ref 150–400)
RBC: 4.03 MIL/uL (ref 3.87–5.11)
RDW: 18.7 % — ABNORMAL HIGH (ref 11.5–15.5)
WBC: 7.8 10*3/uL (ref 4.0–10.5)

## 2016-02-10 LAB — RENAL FUNCTION PANEL
ALBUMIN: 2.7 g/dL — AB (ref 3.5–5.0)
ANION GAP: 13 (ref 5–15)
BUN: 61 mg/dL — AB (ref 6–20)
CALCIUM: 7.9 mg/dL — AB (ref 8.9–10.3)
CO2: 19 mmol/L — AB (ref 22–32)
Chloride: 102 mmol/L (ref 101–111)
Creatinine, Ser: 4.12 mg/dL — ABNORMAL HIGH (ref 0.44–1.00)
GFR calc Af Amer: 10 mL/min — ABNORMAL LOW (ref >60)
GFR calc non Af Amer: 9 mL/min — ABNORMAL LOW (ref >60)
GLUCOSE: 100 mg/dL — AB (ref 65–99)
PHOSPHORUS: 4.5 mg/dL (ref 2.5–4.6)
Potassium: 5.1 mmol/L (ref 3.5–5.1)
SODIUM: 134 mmol/L — AB (ref 135–145)

## 2016-02-10 NOTE — Progress Notes (Signed)
Michaela Tyler  MRN: [id number]  DOB/AGE: 1921-06-29 82 y.o.  Primary Care Physician:FANTA,TESFAYE, MD  Admit date: 01/26/2016  Chief Complaint:  Chief Complaint  Patient presents with  . Hypotension    S-Pt presented on  02/21/2016 with  Chief Complaint  Patient presents with  . Hypotension  .    Pt offers no specific complaints.   Meds  . enoxaparin (LOVENOX) injection  30 mg Subcutaneous Q24H  . piperacillin-tazobactam (ZOSYN)  IV  2.25 g Intravenous Q8H  . vancomycin  1,500 mg Intravenous Q48H       Physical Exam: Vital signs in last 24 hours: Temp:  [97 F (36.1 C)-97.4 F (36.3 C)] 97 F (36.1 C) (04/16 1400) Pulse Rate:  [25-85] 85 (04/16 1500) Resp:  [15-24] 16 (04/16 1500) BP: (63-92)/(37-80) 80/62 mmHg (04/16 1400) SpO2:  [62 %-100 %] 100 % (04/16 1500) Weight:  [261 lb 11 oz (118.7 kg)] 261 lb 11 oz (118.7 kg) (04/16 0500) Weight change: 23 lb 11 oz (10.744 kg) Last BM Date: 02/10/16  Intake/Output from previous day: 04/15 0701 - 04/16 0700 In: 7277.1 [P.O.:1200; I.V.:5977.1; IV Piggyback:100] Out: 200 [Urine:200] Total I/O In: 770.8 [P.O.:200; I.V.:470.8; IV Piggyback:100] Out: -    Physical Exam: General- pt is awake,alert, follows comands Resp- No acute REsp distress,Rhonchi + CVS- S1S2 Irregular in rate and rhythm GIT- BS+, soft, NT, ND EXT- 2+  LE Edema, Cyanosis   Lab Results: CBC  Recent Labs  02/09/16 0412 02/10/16 0459  WBC 7.2 7.8  HGB 12.6 12.7  HCT 38.4 39.1  PLT 149* 138*    BMET  Recent Labs  02/09/16 0412 02/10/16 0459  NA 134* 134*  K 4.7 5.1  CL 99* 102  CO2 22 19*  GLUCOSE 99 100*  BUN 62* 61*  CREATININE 3.78* 4.12*  CALCIUM 7.8* 7.9*    Creat trend 2017  2.5=>3.8=>4.1         2.1--5.3 ( March admission)  2016   1.45 2013    1.3 2010    1.43   MICRO Recent Results (from the past 240 hour(s))  Blood culture (routine x 2)     Status: None (Preliminary result)   Collection Time: 02/04/2016   7:18 PM  Result Value Ref Range Status   Specimen Description LEFT ANTECUBITAL  Final   Special Requests BOTTLES DRAWN AEROBIC AND ANAEROBIC 6CC  Final   Culture NO GROWTH 2 DAYS  Final   Report Status PENDING  Incomplete  Blood culture (routine x 2)     Status: None (Preliminary result)   Collection Time: 01/27/2016  7:30 PM  Result Value Ref Range Status   Specimen Description BLOOD LEFT WRIST  Final   Special Requests BOTTLES DRAWN AEROBIC ONLY 4CC  Final   Culture NO GROWTH 2 DAYS  Final   Report Status PENDING  Incomplete  MRSA PCR Screening     Status: None   Collection Time: 02/23/2016 11:55 PM  Result Value Ref Range Status   MRSA by PCR NEGATIVE NEGATIVE Final    Comment:        The GeneXpert MRSA Assay (FDA approved for NASAL specimens only), is one component of a comprehensive MRSA colonization surveillance program. It is not intended to diagnose MRSA infection nor to guide or monitor treatment for MRSA infections.       Lab Results  Component Value Date   PTH 85* 01/24/2016   PTH Comment 01/24/2016   CALCIUM 7.9* 02/10/2016   PHOS 4.5  02/10/2016          Impression: 1)Renal  AKI secondary to Prerenal/ATN                AKI sec to Hypotension/Hypovolemia/ACE                AKI on CKD               CKD stage 4.               CKD since 2010               CKD secondary to  Cardiorenal/Age associated decline/Multple AKI                Progression of CKD marked with multiple AKI                2)CVS- admitted with hypotension               Cortisol with normal limits              Hx of CHF        3)CNS- hx of dementia  4)CKD Mineral-Bone Disorder PTH acceptable Secondary Hyperparathyroidism  present Phosphorus at goal.   5)Afib -Hx of Afib Primary MD following regarding need of Digoxin/Cardiology help          6)Electrolytes  Normokalemic  Hyponatremic   7)Acid base Co2 not at goal Lactic acidosis     Plan:  Will suggest to  check Vanco trough Will follow bmet     Michaela Tyler S 02/10/2016, 4:37 PM

## 2016-02-10 NOTE — Progress Notes (Signed)
Patient ID: Michaela Tyler M Shimamoto, female   DOB: 1921-08-30, 82 y.o.   MRN: [id number]

## 2016-02-10 NOTE — Progress Notes (Signed)
Subjective: Patient feels better. Her B/P remained on the low side but no fever or chills..  Objective: Vital signs in last 24 hours: Temp:  [97 F (36.1 C)-97.4 F (36.3 C)] 97.4 F (36.3 C) (04/16 0800) Pulse Rate:  [25-89] 82 (04/16 1000) Resp:  [15-24] 19 (04/16 1000) BP: (61-102)/(33-86) 63/52 mmHg (04/16 1000) SpO2:  [63 %-93 %] 91 % (04/16 1000) Weight:  [118.7 kg (261 lb 11 oz)] 118.7 kg (261 lb 11 oz) (04/16 0500) Weight change: 10.744 kg (23 lb 11 oz) Last BM Date: 02/10/16  Intake/Output from previous day: 04/15 0701 - 04/16 0700 In: 7277.1 [P.O.:1200; I.V.:5977.1; IV Piggyback:100] Out: 200 [Urine:200]  PHYSICAL EXAM General appearance: alert and no distress Resp: diminished breath sounds bilaterally and rhonchi bilaterally Cardio: irregularly irregular rhythm GI: soft, non-tender; bowel sounds normal; no masses,  no organomegaly Extremities: edema 2++ edema  Lab Results:  Results for orders placed or performed during the hospital encounter of 02/11/2016 (from the past 48 hour(s))  Lactic acid, plasma     Status: Abnormal   Collection Time: 01/28/2016  6:40 PM  Result Value Ref Range   Lactic Acid, Venous 3.9 (HH) 0.5 - 2.0 mmol/L    Comment: CRITICAL RESULT CALLED TO, READ BACK BY AND VERIFIED WITH: TURNER,C. AT 2017 ON 02/03/2016 BY AGUNDIZ,E.   CBC with Differential/Platelet     Status: Abnormal   Collection Time: 02/12/2016  7:18 PM  Result Value Ref Range   WBC 7.0 4.0 - 10.5 K/uL   RBC 4.19 3.87 - 5.11 MIL/uL   Hemoglobin 13.2 12.0 - 15.0 g/dL   HCT 40.5 36.0 - 46.0 %   MCV 96.7 78.0 - 100.0 fL   MCH 31.5 26.0 - 34.0 pg   MCHC 32.6 30.0 - 36.0 g/dL   RDW 18.7 (H) 11.5 - 15.5 %   Platelets 136 (L) 150 - 400 K/uL   Neutrophils Relative % 66 %   Neutro Abs 4.6 1.7 - 7.7 K/uL   Lymphocytes Relative 23 %   Lymphs Abs 1.6 0.7 - 4.0 K/uL   Monocytes Relative 9 %   Monocytes Absolute 0.6 0.1 - 1.0 K/uL   Eosinophils Relative 2 %   Eosinophils Absolute 0.1  0.0 - 0.7 K/uL   Basophils Relative 0 %   Basophils Absolute 0.0 0.0 - 0.1 K/uL  Urinalysis, Routine w reflex microscopic (not at [facility])     Status: Abnormal   Collection Time: 02/03/2016  7:18 PM  Result Value Ref Range   Color, Urine YELLOW YELLOW   APPearance CLEAR CLEAR   Specific Gravity, Urine >1.030 (H) 1.005 - 1.030   pH 5.5 5.0 - 8.0   Glucose, UA NEGATIVE NEGATIVE mg/dL   Hgb urine dipstick NEGATIVE NEGATIVE   Bilirubin Urine NEGATIVE NEGATIVE   Ketones, ur NEGATIVE NEGATIVE mg/dL   Protein, ur NEGATIVE NEGATIVE mg/dL   Nitrite NEGATIVE NEGATIVE   Leukocytes, UA NEGATIVE NEGATIVE    Comment: MICROSCOPIC NOT DONE ON URINES WITH NEGATIVE PROTEIN, BLOOD, LEUKOCYTES, NITRITE, OR GLUCOSE <1000 mg/dL.  Blood culture (routine x 2)     Status: None (Preliminary result)   Collection Time: 02/05/2016  7:18 PM  Result Value Ref Range   Specimen Description LEFT ANTECUBITAL    Special Requests BOTTLES DRAWN AEROBIC AND ANAEROBIC 6CC    Culture NO GROWTH 2 DAYS    Report Status PENDING   Comprehensive metabolic panel     Status: Abnormal   Collection Time: 02/13/2016  7:18 PM  Result  Value Ref Range   Sodium 132 (L) 135 - 145 mmol/L   Potassium 5.2 (H) 3.5 - 5.1 mmol/L    Comment: DELTA CHECK NOTED   Chloride 94 (L) 101 - 111 mmol/L   CO2 24 22 - 32 mmol/L   Glucose, Bld 90 65 - 99 mg/dL   BUN 65 (H) 6 - 20 mg/dL   Creatinine, Ser 3.85 (H) 0.44 - 1.00 mg/dL    Comment: DELTA CHECK NOTED   Calcium 8.2 (L) 8.9 - 10.3 mg/dL   Total Protein 6.6 6.5 - 8.1 g/dL   Albumin 3.1 (L) 3.5 - 5.0 g/dL   AST 27 15 - 41 U/L   ALT 27 14 - 54 U/L   Alkaline Phosphatase 117 38 - 126 U/L   Total Bilirubin 1.9 (H) 0.3 - 1.2 mg/dL   GFR calc non Af Amer 9 (L) >60 mL/min   GFR calc Af Amer 11 (L) >60 mL/min    Comment: (NOTE) The eGFR has been calculated using the CKD EPI equation. This calculation has not been validated in all clinical situations. eGFR's persistently <60 mL/min signify possible  Chronic Kidney Disease.    Anion gap 14 5 - 15  Cortisol     Status: None   Collection Time: 02/13/2016  7:18 PM  Result Value Ref Range   Cortisol, Plasma 24.4 ug/dL    Comment: (NOTE) AM    6.7 - 22.6 ug/dL PM   <10.0       ug/dL Performed at [facility]   Procalcitonin - Baseline     Status: None   Collection Time: 01/28/2016  7:18 PM  Result Value Ref Range   Procalcitonin 0.47 ng/mL  Blood culture (routine x 2)     Status: None (Preliminary result)   Collection Time: 02/21/2016  7:30 PM  Result Value Ref Range   Specimen Description BLOOD LEFT WRIST    Special Requests BOTTLES DRAWN AEROBIC ONLY 4CC    Culture NO GROWTH 2 DAYS    Report Status PENDING   Lactic acid, plasma     Status: Abnormal   Collection Time: 02/05/2016  9:05 PM  Result Value Ref Range   Lactic Acid, Venous 3.8 (HH) 0.5 - 2.0 mmol/L    Comment: CRITICAL RESULT CALLED TO, READ BACK BY AND VERIFIED WITH:  HAYMORE,R @ 2204 ON 02/22/2016 BY WOODIE,J   Troponin I     Status: Abnormal   Collection Time:  10:50 PM  Result Value Ref Range   Troponin I 0.16 (H) <0.031 ng/mL    Comment:        PERSISTENTLY INCREASED TROPONIN VALUES IN THE RANGE OF 0.04-0.49 ng/mL CAN BE SEEN IN:       -UNSTABLE ANGINA       -CONGESTIVE HEART FAILURE       -MYOCARDITIS       -CHEST TRAUMA       -ARRYHTHMIAS       -LATE PRESENTING MYOCARDIAL INFARCTION       -COPD   CLINICAL FOLLOW-UP RECOMMENDED.   MRSA PCR Screening     Status: None   Collection Time: 02/09/2016 11:55 PM  Result Value Ref Range   MRSA by PCR NEGATIVE NEGATIVE    Comment:        The GeneXpert MRSA Assay (FDA approved for NASAL specimens only), is one component of a comprehensive MRSA colonization surveillance program. It is not intended to diagnose MRSA infection nor to guide or monitor treatment for MRSA  infections.   Lactic acid, plasma     Status: Abnormal   Collection Time: 02/09/16  1:25 AM  Result Value Ref Range   Lactic Acid,  Venous 3.3 (HH) 0.5 - 2.0 mmol/L    Comment: CRITICAL RESULT CALLED TO, READ BACK BY AND VERIFIED WITH:  WAGONER,R @ 0230 ON 02/09/16 BY WOODIE,J   Protime-INR     Status: Abnormal   Collection Time: 02/09/16  1:25 AM  Result Value Ref Range   Prothrombin Time 18.4 (H) 11.6 - 15.2 seconds   INR 1.52 (H) 0.00 - 1.49  APTT     Status: None   Collection Time: 02/09/16  1:25 AM  Result Value Ref Range   aPTT 36 24 - 37 seconds  Lactic acid, plasma     Status: Abnormal   Collection Time: 02/09/16  4:12 AM  Result Value Ref Range   Lactic Acid, Venous 3.3 (HH) 0.5 - 2.0 mmol/L    Comment: CRITICAL RESULT CALLED TO, READ BACK BY AND VERIFIED WITH: HEARN J. AT 0610A ON [id number] BY THOMPSON S.   Basic metabolic panel     Status: Abnormal   Collection Time: 02/09/16  4:12 AM  Result Value Ref Range   Sodium 134 (L) 135 - 145 mmol/L   Potassium 4.7 3.5 - 5.1 mmol/L   Chloride 99 (L) 101 - 111 mmol/L   CO2 22 22 - 32 mmol/L   Glucose, Bld 99 65 - 99 mg/dL   BUN 62 (H) 6 - 20 mg/dL   Creatinine, Ser 3.78 (H) 0.44 - 1.00 mg/dL   Calcium 7.8 (L) 8.9 - 10.3 mg/dL   GFR calc non Af Amer 9 (L) >60 mL/min   GFR calc Af Amer 11 (L) >60 mL/min    Comment: (NOTE) The eGFR has been calculated using the CKD EPI equation. This calculation has not been validated in all clinical situations. eGFR's persistently <60 mL/min signify possible Chronic Kidney Disease.    Anion gap 13 5 - 15  CBC     Status: Abnormal   Collection Time: 02/09/16  4:12 AM  Result Value Ref Range   WBC 7.2 4.0 - 10.5 K/uL   RBC 3.93 3.87 - 5.11 MIL/uL   Hemoglobin 12.6 12.0 - 15.0 g/dL   HCT 38.4 36.0 - 46.0 %   MCV 97.7 78.0 - 100.0 fL   MCH 32.1 26.0 - 34.0 pg   MCHC 32.8 30.0 - 36.0 g/dL   RDW 18.7 (H) 11.5 - 15.5 %   Platelets 149 (L) 150 - 400 K/uL  CBC     Status: Abnormal   Collection Time: 02/10/16  4:59 AM  Result Value Ref Range   WBC 7.8 4.0 - 10.5 K/uL    Comment: WHITE COUNT CONFIRMED ON SMEAR   RBC 4.03  3.87 - 5.11 MIL/uL   Hemoglobin 12.7 12.0 - 15.0 g/dL   HCT 39.1 36.0 - 46.0 %   MCV 97.0 78.0 - 100.0 fL   MCH 31.5 26.0 - 34.0 pg   MCHC 32.5 30.0 - 36.0 g/dL   RDW 18.7 (H) 11.5 - 15.5 %   Platelets 138 (L) 150 - 400 K/uL    Comment: SPECIMEN CHECKED FOR CLOTS PLATELET COUNT CONFIRMED BY SMEAR   Renal function panel     Status: Abnormal   Collection Time: 02/10/16  4:59 AM  Result Value Ref Range   Sodium 134 (L) 135 - 145 mmol/L   Potassium 5.1 3.5 - 5.1 mmol/L  Chloride 102 101 - 111 mmol/L   CO2 19 (L) 22 - 32 mmol/L   Glucose, Bld 100 (H) 65 - 99 mg/dL   BUN 61 (H) 6 - 20 mg/dL   Creatinine, Ser 4.12 (H) 0.44 - 1.00 mg/dL   Calcium 7.9 (L) 8.9 - 10.3 mg/dL   Phosphorus 4.5 2.5 - 4.6 mg/dL   Albumin 2.7 (L) 3.5 - 5.0 g/dL   GFR calc non Af Amer 9 (L) >60 mL/min   GFR calc Af Amer 10 (L) >60 mL/min    Comment: (NOTE) The eGFR has been calculated using the CKD EPI equation. This calculation has not been validated in all clinical situations. eGFR's persistently <60 mL/min signify possible Chronic Kidney Disease.    Anion gap 13 5 - 15    ABGS No results for input(s): PHART, PO2ART, TCO2, HCO3 in the last 72 hours.  Invalid input(s): PCO2 CULTURES Recent Results (from the past 240 hour(s))  Blood culture (routine x 2)     Status: None (Preliminary result)   Collection Time: 02/01/2016  7:18 PM  Result Value Ref Range Status   Specimen Description LEFT ANTECUBITAL  Final   Special Requests BOTTLES DRAWN AEROBIC AND ANAEROBIC 6CC  Final   Culture NO GROWTH 2 DAYS  Final   Report Status PENDING  Incomplete  Blood culture (routine x 2)     Status: None (Preliminary result)   Collection Time: 02/06/2016  7:30 PM  Result Value Ref Range Status   Specimen Description BLOOD LEFT WRIST  Final   Special Requests BOTTLES DRAWN AEROBIC ONLY 4CC  Final   Culture NO GROWTH 2 DAYS  Final   Report Status PENDING  Incomplete  MRSA PCR Screening     Status: None   Collection  Time: 02/01/2016 11:55 PM  Result Value Ref Range Status   MRSA by PCR NEGATIVE NEGATIVE Final    Comment:        The GeneXpert MRSA Assay (FDA approved for NASAL specimens only), is one component of a comprehensive MRSA colonization surveillance program. It is not intended to diagnose MRSA infection nor to guide or monitor treatment for MRSA infections.    Studies/Results: Dg Chest Portable 1 View  02/19/2016  CLINICAL DATA:  Hypotension, dyspnea. EXAM: PORTABLE CHEST 1 VIEW COMPARISON:  02/01/2016 FINDINGS: Moderate cardiomegaly, unchanged. Elevation of the right hemidiaphragm, unchanged. Mildly prominent hilar contours, likely vascular. The lungs are clear. No large effusions. No pneumothorax. IMPRESSION: Cardiomegaly. Prominent central pulmonary vasculature, possibly related to pulmonary arterial hypertension. No acute cardiopulmonary findings. Electronically Signed   By: Andreas Newport M.D.   On: 02/07/2016 21:05    Medications: I have reviewed the patient's current medications.  Assesment:   Principal Problem:   Sepsis ([place]) Active Problems:   Chronic atrial fibrillation (HCC)   AKI (acute kidney injury) (HCC)   Paroxysmal supraventricular tachycardia (HCC)   Hypotension   Dementia   CKD (chronic kidney disease)   Lactic acid acidosis   CHF, chronic (HCC)   DNR (do not resuscitate)   Decubitus skin ulcer   Acute on chronic renal insufficiency ([place])    Plan:  Medications reviewed Continue IV antibiotics Continue IV fluid Will monitor CBC/BMP Will  nephrology consult appreciated.    LOS: 2 days   Mellisa Arshad 02/10/2016, 10:38 AM

## 2016-02-11 ENCOUNTER — Encounter: Payer: Self-pay | Admitting: Internal Medicine

## 2016-02-11 LAB — BASIC METABOLIC PANEL
Anion gap: 14 (ref 5–15)
BUN: 62 mg/dL — AB (ref 6–20)
CO2: 16 mmol/L — ABNORMAL LOW (ref 22–32)
Calcium: 8.1 mg/dL — ABNORMAL LOW (ref 8.9–10.3)
Chloride: 104 mmol/L (ref 101–111)
Creatinine, Ser: 3.96 mg/dL — ABNORMAL HIGH (ref 0.44–1.00)
GFR calc Af Amer: 10 mL/min — ABNORMAL LOW (ref >60)
GFR, EST NON AFRICAN AMERICAN: 9 mL/min — AB (ref >60)
Glucose, Bld: 109 mg/dL — ABNORMAL HIGH (ref 65–99)
POTASSIUM: 4.9 mmol/L (ref 3.5–5.1)
SODIUM: 134 mmol/L — AB (ref 135–145)

## 2016-02-11 LAB — CBC
HCT: 39.2 % (ref 36.0–46.0)
HEMOGLOBIN: 12.8 g/dL (ref 12.0–15.0)
MCH: 31.9 pg (ref 26.0–34.0)
MCHC: 32.7 g/dL (ref 30.0–36.0)
MCV: 97.8 fL (ref 78.0–100.0)
PLATELETS: 84 10*3/uL — AB (ref 150–400)
RBC: 4.01 MIL/uL (ref 3.87–5.11)
RDW: 18.8 % — ABNORMAL HIGH (ref 11.5–15.5)
WBC: 9.4 10*3/uL (ref 4.0–10.5)

## 2016-02-11 MED ORDER — SODIUM CHLORIDE 0.9 % IV BOLUS (SEPSIS)
500.0000 mL | Freq: Once | INTRAVENOUS | Status: AC
  Administered 2016-02-11: 500 mL via INTRAVENOUS

## 2016-02-11 NOTE — Care Management Note (Signed)
Case Management Note  Patient Details  Name: Arlyn LeakGarnett M Hinzman MRN: [id number] Date of Birth: [birth year]  Subjective/Objective:                    Action/Plan: CSW following for dsicharge plan to SNF    Expected Discharge Date:  02/12/16               Expected Discharge Plan:  Skilled Nursing Facility  In-House Referral:     Discharge planning Services  CM Consult  Post Acute Care Choice:    Choice offered to:     DME Arranged:    DME Agency:     HH Arranged:    HH Agency:     Status of Service:  Completed, signed off  Medicare Important Message Given:    Date Medicare IM Given:    Medicare IM give by:    Date Additional Medicare IM Given:    Additional Medicare Important Message give by:     If discussed at Long Length of Stay Meetings, dates discussed:    Additional Comments:  Adonis HugueninBerkhead, Kobe Ofallon L, RN 02/11/2016, 5:04 PM

## 2016-02-11 NOTE — Progress Notes (Signed)
eLink Physician-Brief Progress Note Patient Name: Michaela Tyler DOB: 05-06-1921 MRN: [id number]   Date of Service  02/11/2016  HPI/Events of Note  Hypotension - BP = 88/75 (?). Patient is DNR. Goals of care? Last LVEF = 45% to 50%.  eICU Interventions  Will order: 1. 0.9 NaCl 500 mL IV over 30 minutes now.   Further management per on-site physicians.      Intervention Category Intermediate Interventions: Hypotension - evaluation and management  Sommer,Steven Eugene 02/11/2016, 3:56 PM

## 2016-02-11 NOTE — Progress Notes (Signed)
Michaela Tyler  MRN: [id number]  DOB/AGE: 03/05/1921 82 y.o.  Primary Care Physician:FANTA,TESFAYE, MD  Admit date: 02/05/2016  Chief Complaint:  Chief Complaint  Patient presents with  . Hypotension    S-Pt presented on  01/30/2016 with  Chief Complaint  Patient presents with  . Hypotension  .    Pt offers no specific complaints.   Meds  . enoxaparin (LOVENOX) injection  30 mg Subcutaneous Q24H  . piperacillin-tazobactam (ZOSYN)  IV  2.25 g Intravenous Q8H  . vancomycin  1,500 mg Intravenous Q48H       Physical Exam: Vital signs in last 24 hours: Temp:  [96.4 F (35.8 C)-98.9 F (37.2 C)] 98.6 F (37 C) (04/17 1411) Pulse Rate:  [25-128] 119 (04/17 1300) Resp:  [17-22] 20 (04/17 1400) BP: (68-118)/(56-103) 68/56 mmHg (04/17 1400) SpO2:  [89 %-100 %] 94 % (04/17 1300) Weight change:  Last BM Date: 02/10/16  Intake/Output from previous day: 04/16 0701 - 04/17 0700 In: 3445.8 [P.O.:200; I.V.:2595.8; IV Piggyback:650] Out: 250 [Urine:250] Total I/O In: 1190 [P.O.:240; I.V.:950] Out: 175 [Urine:175]   Physical Exam: General- pt is awake,alert, follows comands Resp- No acute REsp distress,Rhonchi + CVS- S1S2 Irregular in rate and rhythm GIT- BS+, soft, NT, ND EXT- 2+  LE Edema, Cyanosis   Lab Results: CBC  Recent Labs  02/10/16 0459 02/11/16 0454  WBC 7.8 9.4  HGB 12.7 12.8  HCT 39.1 39.2  PLT 138* 84*    BMET  Recent Labs  02/10/16 0459 02/11/16 0454  NA 134* 134*  K 5.1 4.9  CL 102 104  CO2 19* 16*  GLUCOSE 100* 109*  BUN 61* 62*  CREATININE 4.12* 3.96*  CALCIUM 7.9* 8.1*    Creat trend 2017  2.5=>3.8=>4.1=>3.96         2.1--5.3 ( March admission)  2016   1.45 2013    1.3 2010    1.43   MICRO Recent Results (from the past 240 hour(s))  Blood culture (routine x 2)     Status: None (Preliminary result)   Collection Time: 02/04/2016  7:18 PM  Result Value Ref Range Status   Specimen Description LEFT ANTECUBITAL  Final   Special Requests BOTTLES DRAWN AEROBIC AND ANAEROBIC 6CC  Final   Culture NO GROWTH 3 DAYS  Final   Report Status PENDING  Incomplete  Blood culture (routine x 2)     Status: None (Preliminary result)   Collection Time: 02/04/2016  7:30 PM  Result Value Ref Range Status   Specimen Description BLOOD LEFT WRIST  Final   Special Requests BOTTLES DRAWN AEROBIC ONLY 4CC  Final   Culture NO GROWTH 3 DAYS  Final   Report Status PENDING  Incomplete  MRSA PCR Screening     Status: None   Collection Time: 01/28/2016 11:55 PM  Result Value Ref Range Status   MRSA by PCR NEGATIVE NEGATIVE Final    Comment:        The GeneXpert MRSA Assay (FDA approved for NASAL specimens only), is one component of a comprehensive MRSA colonization surveillance program. It is not intended to diagnose MRSA infection nor to guide or monitor treatment for MRSA infections.       Lab Results  Component Value Date   PTH 85* 01/24/2016   PTH Comment 01/24/2016   CALCIUM 8.1* 02/11/2016   PHOS 4.5 02/10/2016          Impression: 1)Renal  AKI secondary to Prerenal/ATN  AKI sec to Hypotension/Hypovolemia/ACE                AKI on CKD               CKD stage 4.               CKD since 2010               CKD secondary to  Cardiorenal/Age associated decline/Multple AKI                Progression of CKD marked with multiple AKI                2)CVS- admitted with hypotension               Cortisol with normal limits              Hx of CHF                    Pt remains hypotensive             Primary team evaluating for IV pressors/ Cardio help  3)CNS- hx of dementia  4)CKD Mineral-Bone Disorder PTH acceptable Secondary Hyperparathyroidism  present Phosphorus at goal.   5)Afib -Hx of Afib Primary MD following regarding need of Digoxin/Cardiology help          6)Electrolytes  Normokalemic  Hyponatremic   7)Acid base Co2 not at goal Lactic acidosis     Plan:  Will  suggest to check Vanco trough Will  Suggest ABG as bicarb on lower side    Leonidas Boateng S 02/11/2016, 3:13 PM

## 2016-02-11 NOTE — Progress Notes (Addendum)
ANTIBIOTIC CONSULT NOTE - follow up  Pharmacy Consult for Vancomycin & Zosyn Indication: sepsis  No Known Allergies  Patient Measurements: Height: 5\' 11"  (180.3 cm) Weight: 261 lb 11 oz (118.7 kg) IBW/kg (Calculated) : 70.8  Vital Signs: Temp: 97.4 F (36.3 C) (04/17 0743) Temp Source: Axillary (04/17 0743) BP: 77/59 mmHg (04/17 0400) Pulse Rate: 57 (04/17 0400) Intake/Output from previous day: 04/16 0701 - 04/17 0700 In: 3445.8 [P.O.:200; I.V.:2595.8; IV Piggyback:650] Out: 250 [Urine:250] Intake/Output from this shift: Total I/O In: -  Out: 175 [Urine:175]  Labs:  Recent Labs  02/09/16 0412 02/10/16 0459 02/11/16 0454  WBC 7.2 7.8 9.4  HGB 12.6 12.7 12.8  PLT 149* 138* 84*  CREATININE 3.78* 4.12* 3.96*   Estimated Creatinine Clearance: 12.9 mL/min (by C-G formula based on Cr of 3.96). No results for input(s): VANCOTROUGH, VANCOPEAK, VANCORANDOM, GENTTROUGH, GENTPEAK, GENTRANDOM, TOBRATROUGH, TOBRAPEAK, TOBRARND, AMIKACINPEAK, AMIKACINTROU, AMIKACIN in the last 72 hours.   Microbiology: Recent Results (from the past 720 hour(s))  Urine culture     Status: None   Collection Time: 01/22/16  2:15 PM  Result Value Ref Range Status   Specimen Description URINE, CATHETERIZED  Final   Special Requests NONE  Final   Culture   Final    >=100,000 COLONIES/mL ESCHERICHIA COLI Performed at [facility]    Report Status 01/25/2016 FINAL  Final   Organism ID, Bacteria ESCHERICHIA COLI  Final      Susceptibility   Escherichia coli - MIC*    AMPICILLIN >=32 RESISTANT Resistant     CEFAZOLIN <=4 SENSITIVE Sensitive     CEFTRIAXONE <=1 SENSITIVE Sensitive     CIPROFLOXACIN >=4 RESISTANT Resistant     GENTAMICIN >=16 RESISTANT Resistant     IMIPENEM <=0.25 SENSITIVE Sensitive     NITROFURANTOIN <=16 SENSITIVE Sensitive     TRIMETH/SULFA <=20 SENSITIVE Sensitive     AMPICILLIN/SULBACTAM 16 INTERMEDIATE Intermediate     PIP/TAZO <=4 SENSITIVE Sensitive     *  >=100,000 COLONIES/mL ESCHERICHIA COLI  Urine culture     Status: None   Collection Time: 01/26/16  9:02 PM  Result Value Ref Range Status   Specimen Description URINE, CATHETERIZED  Final   Special Requests NONE  Final   Culture   Final    NO GROWTH 2 DAYS Performed at [facility]    Report Status 01/29/2016 FINAL  Final  Blood culture (routine x 2)     Status: None (Preliminary result)   Collection Time: 02/12/2016  7:18 PM  Result Value Ref Range Status   Specimen Description LEFT ANTECUBITAL  Final   Special Requests BOTTLES DRAWN AEROBIC AND ANAEROBIC 6CC  Final   Culture NO GROWTH 3 DAYS  Final   Report Status PENDING  Incomplete  Blood culture (routine x 2)     Status: None (Preliminary result)   Collection Time: 02/07/2016  7:30 PM  Result Value Ref Range Status   Specimen Description BLOOD LEFT WRIST  Final   Special Requests BOTTLES DRAWN AEROBIC ONLY 4CC  Final   Culture NO GROWTH 3 DAYS  Final   Report Status PENDING  Incomplete  MRSA PCR Screening     Status: None   Collection Time: 01/31/2016 11:55 PM  Result Value Ref Range Status   MRSA by PCR NEGATIVE NEGATIVE Final    Comment:        The GeneXpert MRSA Assay (FDA approved for NASAL specimens only), is one component of a comprehensive MRSA colonization surveillance  program. It is not intended to diagnose MRSA infection nor to guide or monitor treatment for MRSA infections.    Medical History: Past Medical History  Diagnosis Date  . Hypertension   . High cholesterol   . GERD (gastroesophageal reflux disease)   . Arthritis   . Anxiety   . Depression   . Cataract   . Hearing reduced   . PONV (postoperative nausea and vomiting)   . H/O left breast biopsy     NO CANCER  . Low back pain   . Chronic right hip pain   . Atrial fibrillation (HCC)   . Thyroid disease   . GAD (generalized anxiety disorder)   . Dysphagia    Medications:  Scheduled:  . enoxaparin (LOVENOX) injection  30 mg  Subcutaneous Q24H  . piperacillin-tazobactam (ZOSYN)  IV  2.25 g Intravenous Q8H  . vancomycin  1,500 mg Intravenous Q48H   Assessment: 82 yo OBESE female admitted from nursing facility for sepsis. Increased lactic acid. Originally came to ED due to hypotension. SCr elevated from baseline which fluctuates.  Normalized clcr ~ 26ml/min.  MRSA PCR (-), MRSA pna unlikely.    Goal of Therapy:  Vancomycin trough level 15-20 mcg/ml  Plan:   Vancomycin 1500 mg IV every 48 hours  Zosyn 2.25 GM IV every 8 hours  MRSA PCR (-), consider deescalate ABX  Vancomycin trough at steady state if Vanc continues (before 4th dose)  Monitor renal function, SCr  F/U cultures, progress  Margo Aye, Mckinlee Dunk A 02/11/2016,7:50 AM

## 2016-02-11 NOTE — Progress Notes (Signed)
Subjective: Patient is improving. Her po intake slightly better. No fever or chill. No major change in her renal function..  Objective: Vital signs in last 24 hours: Temp:  [96.9 F (36.1 C)-98.9 F (37.2 C)] 97.4 F (36.3 C) (04/17 0743) Pulse Rate:  [25-128] 28 (04/17 0800) Resp:  [16-22] 20 (04/17 0800) BP: (63-118)/(52-103) 76/60 mmHg (04/17 0800) SpO2:  [62 %-100 %] 90 % (04/17 0800) Weight change:  Last BM Date: 02/10/16  Intake/Output from previous day: 04/16 0701 - 04/17 0700 In: 3445.8 [P.O.:200; I.V.:2595.8; IV Piggyback:650] Out: 250 [Urine:250]  PHYSICAL EXAM General appearance: alert and no distress Resp: diminished breath sounds bilaterally and rhonchi bilaterally Cardio: irregularly irregular rhythm GI: soft, non-tender; bowel sounds normal; no masses,  no organomegaly Extremities: edema 2++ edema  Lab Results:  Results for orders placed or performed during the hospital encounter of 01/31/2016 (from the past 48 hour(s))  CBC     Status: Abnormal   Collection Time: 02/10/16  4:59 AM  Result Value Ref Range   WBC 7.8 4.0 - 10.5 K/uL    Comment: WHITE COUNT CONFIRMED ON SMEAR   RBC 4.03 3.87 - 5.11 MIL/uL   Hemoglobin 12.7 12.0 - 15.0 g/dL   HCT 39.1 36.0 - 46.0 %   MCV 97.0 78.0 - 100.0 fL   MCH 31.5 26.0 - 34.0 pg   MCHC 32.5 30.0 - 36.0 g/dL   RDW 18.7 (H) 11.5 - 15.5 %   Platelets 138 (L) 150 - 400 K/uL    Comment: SPECIMEN CHECKED FOR CLOTS PLATELET COUNT CONFIRMED BY SMEAR   Renal function panel     Status: Abnormal   Collection Time: 02/10/16  4:59 AM  Result Value Ref Range   Sodium 134 (L) 135 - 145 mmol/L   Potassium 5.1 3.5 - 5.1 mmol/L   Chloride 102 101 - 111 mmol/L   CO2 19 (L) 22 - 32 mmol/L   Glucose, Bld 100 (H) 65 - 99 mg/dL   BUN 61 (H) 6 - 20 mg/dL   Creatinine, Ser 4.12 (H) 0.44 - 1.00 mg/dL   Calcium 7.9 (L) 8.9 - 10.3 mg/dL   Phosphorus 4.5 2.5 - 4.6 mg/dL   Albumin 2.7 (L) 3.5 - 5.0 g/dL   GFR calc non Af Amer 9 (L) >60  mL/min   GFR calc Af Amer 10 (L) >60 mL/min    Comment: (NOTE) The eGFR has been calculated using the CKD EPI equation. This calculation has not been validated in all clinical situations. eGFR's persistently <60 mL/min signify possible Chronic Kidney Disease.    Anion gap 13 5 - 15  Basic metabolic panel     Status: Abnormal   Collection Time: 02/11/16  4:54 AM  Result Value Ref Range   Sodium 134 (L) 135 - 145 mmol/L   Potassium 4.9 3.5 - 5.1 mmol/L   Chloride 104 101 - 111 mmol/L   CO2 16 (L) 22 - 32 mmol/L   Glucose, Bld 109 (H) 65 - 99 mg/dL   BUN 62 (H) 6 - 20 mg/dL   Creatinine, Ser 3.96 (H) 0.44 - 1.00 mg/dL   Calcium 8.1 (L) 8.9 - 10.3 mg/dL   GFR calc non Af Amer 9 (L) >60 mL/min   GFR calc Af Amer 10 (L) >60 mL/min    Comment: (NOTE) The eGFR has been calculated using the CKD EPI equation. This calculation has not been validated in all clinical situations. eGFR's persistently <60 mL/min signify possible Chronic Kidney Disease.  Anion gap 14 5 - 15  CBC     Status: Abnormal   Collection Time: 02/11/16  4:54 AM  Result Value Ref Range   WBC 9.4 4.0 - 10.5 K/uL   RBC 4.01 3.87 - 5.11 MIL/uL   Hemoglobin 12.8 12.0 - 15.0 g/dL   HCT 39.2 36.0 - 46.0 %   MCV 97.8 78.0 - 100.0 fL   MCH 31.9 26.0 - 34.0 pg   MCHC 32.7 30.0 - 36.0 g/dL   RDW 18.8 (H) 11.5 - 15.5 %   Platelets 84 (L) 150 - 400 K/uL    Comment: SPECIMEN CHECKED FOR CLOTS PLATELET COUNT CONFIRMED BY SMEAR     ABGS No results for input(s): PHART, PO2ART, TCO2, HCO3 in the last 72 hours.  Invalid input(s): PCO2 CULTURES Recent Results (from the past 240 hour(s))  Blood culture (routine x 2)     Status: None (Preliminary result)   Collection Time: 01/27/2016  7:18 PM  Result Value Ref Range Status   Specimen Description LEFT ANTECUBITAL  Final   Special Requests BOTTLES DRAWN AEROBIC AND ANAEROBIC 6CC  Final   Culture NO GROWTH 3 DAYS  Final   Report Status PENDING  Incomplete  Blood culture  (routine x 2)     Status: None (Preliminary result)   Collection Time: 01/28/2016  7:30 PM  Result Value Ref Range Status   Specimen Description BLOOD LEFT WRIST  Final   Special Requests BOTTLES DRAWN AEROBIC ONLY 4CC  Final   Culture NO GROWTH 3 DAYS  Final   Report Status PENDING  Incomplete  MRSA PCR Screening     Status: None   Collection Time: 02/19/2016 11:55 PM  Result Value Ref Range Status   MRSA by PCR NEGATIVE NEGATIVE Final    Comment:        The GeneXpert MRSA Assay (FDA approved for NASAL specimens only), is one component of a comprehensive MRSA colonization surveillance program. It is not intended to diagnose MRSA infection nor to guide or monitor treatment for MRSA infections.    Studies/Results: No results found.  Medications: I have reviewed the patient's current medications.  Assesment:   Principal Problem:   Sepsis ([place]) Active Problems:   Chronic atrial fibrillation (HCC)   AKI (acute kidney injury) (HCC)   Paroxysmal supraventricular tachycardia (HCC)   Hypotension   Dementia   CKD (chronic kidney disease)   Lactic acid acidosis   CHF, chronic (HCC)   DNR (do not resuscitate)   Decubitus skin ulcer   Acute on chronic renal insufficiency ([place])    Plan:  Medications reviewed Continue IV antibiotics Continue IV fluid Will monitor CBC/BMP Will  nephrology consult appreciated.    LOS: 3 days   Michaela Tyler 02/11/2016, 8:34 AM

## 2016-02-11 NOTE — Progress Notes (Signed)
Patient BP decreasing again.  Currently 60/46 with pulse of 130.  Patient remains asymptomatic. Paged MD to make aware.  Awaiting response.  Cancelled transfer to floor d/t low BP.

## 2016-02-11 NOTE — Progress Notes (Signed)
No return call after paging MD x 2.  Called E-link for concerning BP.  Order for bolus given and administered. Will continue to monitor.

## 2016-02-11 NOTE — Clinical Social Work Note (Signed)
Clinical Social Work Assessment  Patient Details  Name: Michaela Tyler MRN: [id number] Date of Birth: 1921/07/01  Date of referral:  02/11/16               Reason for consult:  Facility Placement                Permission sought to share information with:    Permission granted to share information::     Name::        Agency::     Relationship::     Contact Information:     Housing/Transportation Living arrangements for the past 2 months:  [place], [place] of Information:  Patient, Facility Patient Interpreter Needed:  None Criminal Activity/Legal Involvement Pertinent to Current Situation/Hospitalization:  No - Comment as needed Significant Relationships:  Spouse, Other Family Members (Neice.) Lives with:  Facility Resident Do you feel safe going back to the place where you live?  Yes Need for family participation in patient care:  Yes (Comment)  Care giving concerns:  None identified. Facility resident.   Social Worker assessment / plan:  CSW met with patient who indicated that she has been at [facility] "quite a frew weeks." She stated that at baseline she uses a walker.  Patient stated that she is at [facility] for rehab and plans on returning to complete her rehab before going home.  Keri advised that patient has been at the facility since 01/08/16 and that she is progressing slowly.  She indicated that patient has had a couple of falls since being at the facility.  Kristin Bruins stated that patient's husband visits daily and that their niece assists him with patient.  She stated that patient can return and will not need a new FL2.   Employment status:  Retired Nurse, adult PT Recommendations:  Not assessed at this time Information / Referral to community resources:     Patient/Family's Response to care:  Patient is agreeable to return to [facility]  Patient/Family's Understanding of and Emotional Response to Diagnosis, Current Treatment,  and Prognosis:  Patient understands her diagnosis, treatment and prognosis and agrees that she needs to complete rehab before returning home.  Emotional Assessment Appearance:  Developmentally appropriate Attitude/Demeanor/Rapport:   (Cooperative, pleasant) Affect (typically observed):  Accepting, Adaptable Orientation:  Oriented to Self, Oriented to Place, Oriented to Situation Alcohol / Substance use:  Not Applicable Psych involvement (Current and /or in the community):  No (Comment)  Discharge Needs  Concerns to be addressed:  Discharge Planning Concerns Readmission within the last 30 days:  Yes Current discharge risk:  None Barriers to Discharge:  No Barriers Identified   Ihor Gully, LCSW 02/11/2016, 1:48 PM

## 2016-02-12 LAB — BASIC METABOLIC PANEL
Anion gap: 16 — ABNORMAL HIGH (ref 5–15)
BUN: 62 mg/dL — ABNORMAL HIGH (ref 6–20)
CO2: 17 mmol/L — ABNORMAL LOW (ref 22–32)
Calcium: 8.4 mg/dL — ABNORMAL LOW (ref 8.9–10.3)
Chloride: 103 mmol/L (ref 101–111)
Creatinine, Ser: 3.8 mg/dL — ABNORMAL HIGH (ref 0.44–1.00)
GFR calc Af Amer: 11 mL/min — ABNORMAL LOW (ref >60)
GFR calc non Af Amer: 9 mL/min — ABNORMAL LOW (ref >60)
Glucose, Bld: 112 mg/dL — ABNORMAL HIGH (ref 65–99)
Potassium: 4.8 mmol/L (ref 3.5–5.1)
Sodium: 136 mmol/L (ref 135–145)

## 2016-02-12 MED ORDER — SODIUM CHLORIDE 0.9 % IV SOLN
1000.0000 mL | INTRAVENOUS | Status: DC
  Administered 2016-02-12 – 2016-02-14 (×6): 1000 mL via INTRAVENOUS

## 2016-02-12 MED ORDER — METOPROLOL TARTRATE 25 MG PO TABS
25.0000 mg | ORAL_TABLET | Freq: Two times a day (BID) | ORAL | Status: DC
  Administered 2016-02-12: 25 mg via ORAL
  Filled 2016-02-12 (×2): qty 1

## 2016-02-12 NOTE — Progress Notes (Signed)
Subjective: Patient feels better. Her B/P is improving. No major change in her renal function...  Objective: Vital signs in last 24 hours: Temp:  [96.4 F (35.8 C)-98.6 F (37 C)] 96.9 F (36.1 C) (04/18 0400) Pulse Rate:  [25-119] 70 (04/18 0500) Resp:  [16-26] 23 (04/18 0600) BP: (68-106)/(53-78) 72/59 mmHg (04/18 0600) SpO2:  [60 %-100 %] 90 % (04/18 0500) Weight:  [128.8 kg (283 lb 15.2 oz)] 128.8 kg (283 lb 15.2 oz) (04/18 0500) Weight change:  Last BM Date: 02/10/16  Intake/Output from previous day: 04/17 0701 - 04/18 0700 In: 4390.4 [P.O.:480; I.V.:3310.4; IV Piggyback:600] Out: 425 [Urine:425]  PHYSICAL EXAM General appearance: alert and no distress Resp: diminished breath sounds bilaterally and rhonchi bilaterally Cardio: irregularly irregular rhythm GI: soft, non-tender; bowel sounds normal; no masses,  no organomegaly Extremities: edema 2++ edema  Lab Results:  Results for orders placed or performed during the hospital encounter of 02/04/2016 (from the past 48 hour(s))  Basic metabolic panel     Status: Abnormal   Collection Time: 02/11/16  4:54 AM  Result Value Ref Range   Sodium 134 (L) 135 - 145 mmol/L   Potassium 4.9 3.5 - 5.1 mmol/L   Chloride 104 101 - 111 mmol/L   CO2 16 (L) 22 - 32 mmol/L   Glucose, Bld 109 (H) 65 - 99 mg/dL   BUN 62 (H) 6 - 20 mg/dL   Creatinine, Ser 3.43 (H) 0.44 - 1.00 mg/dL   Calcium 8.1 (L) 8.9 - 10.3 mg/dL   GFR calc non Af Amer 9 (L) >60 mL/min   GFR calc Af Amer 10 (L) >60 mL/min    Comment: (NOTE) The eGFR has been calculated using the CKD EPI equation. This calculation has not been validated in all clinical situations. eGFR's persistently <60 mL/min signify possible Chronic Kidney Disease.    Anion gap 14 5 - 15  CBC     Status: Abnormal   Collection Time: 02/11/16  4:54 AM  Result Value Ref Range   WBC 9.4 4.0 - 10.5 K/uL   RBC 4.01 3.87 - 5.11 MIL/uL   Hemoglobin 12.8 12.0 - 15.0 g/dL   HCT 62.0 58.5 - 95.3 %   MCV 97.8 78.0 - 100.0 fL   MCH 31.9 26.0 - 34.0 pg   MCHC 32.7 30.0 - 36.0 g/dL   RDW 79.6 (H) 26.7 - 05.1 %   Platelets 84 (L) 150 - 400 K/uL    Comment: SPECIMEN CHECKED FOR CLOTS PLATELET COUNT CONFIRMED BY SMEAR     ABGS No results for input(s): PHART, PO2ART, TCO2, HCO3 in the last 72 hours.  Invalid input(s): PCO2 CULTURES Recent Results (from the past 240 hour(s))  Blood culture (routine x 2)     Status: None (Preliminary result)   Collection Time: 02/19/2016  7:18 PM  Result Value Ref Range Status   Specimen Description LEFT ANTECUBITAL  Final   Special Requests BOTTLES DRAWN AEROBIC AND ANAEROBIC 6CC  Final   Culture NO GROWTH 3 DAYS  Final   Report Status PENDING  Incomplete  Blood culture (routine x 2)     Status: None (Preliminary result)   Collection Time: 01/29/2016  7:30 PM  Result Value Ref Range Status   Specimen Description BLOOD LEFT WRIST  Final   Special Requests BOTTLES DRAWN AEROBIC ONLY 4CC  Final   Culture NO GROWTH 3 DAYS  Final   Report Status PENDING  Incomplete  MRSA PCR Screening     Status: None  Collection Time: 02/23/2016 11:55 PM  Result Value Ref Range Status   MRSA by PCR NEGATIVE NEGATIVE Final    Comment:        The GeneXpert MRSA Assay (FDA approved for NASAL specimens only), is one component of a comprehensive MRSA colonization surveillance program. It is not intended to diagnose MRSA infection nor to guide or monitor treatment for MRSA infections.    Studies/Results: No results found.  Medications: I have reviewed the patient's current medications.  Assesment:   Principal Problem:   Sepsis ([place]) Active Problems:   Chronic atrial fibrillation (HCC)   AKI (acute kidney injury) (HCC)   Paroxysmal supraventricular tachycardia (HCC)   Hypotension   Dementia   CKD (chronic kidney disease)   Lactic acid acidosis   CHF, chronic (HCC)   DNR (do not resuscitate)   Decubitus skin ulcer   Acute on chronic renal insufficiency  ([place])    Plan:  Medications reviewed Continue IV antibiotics Continue IV fluid Will monitor cbc/BMP   LOS: 4 days   Michaela Tyler 02/12/2016, 7:53 AM

## 2016-02-12 NOTE — Clinical Documentation Improvement (Addendum)
Dr. Felecia ShellingFanta and/or Associates,  Please document query responses in the progress notes and discharge summary,not on the CDI BPA form.  Thank you.  Query 1 of 2 "Chronic CHF" is documented in the current medical record.  If known or able to determine, please document the TYPE of CHF:  - Systolic  - Diastolic  = Combined  - Other type  - Unable to clinically determine    Query 2 of 2  "Decubitus skin ulcer" documented in daily MD progress notes since 02/09/16.  Stage II ulcer proximal to rectum with partial thickness loss of dermis presenting as a shallow open ulcer with a pink red wound bed with slough is documented by nursing [facility].  If you agree with the nurses' documentation, please document the stage and location of the decubitus skin ulcer in the progress notes and discharge summary"  - Stage II decubitus skin ulcer proximal to rectum  - Other stage and location  - Unable to clinically determine    Please exercise your independent, professional judgment when responding. A specific answer is not anticipated or expected.   Thank You, Jerral Ralphathy R Lycia Sachdeva  RN BSN CCDS [phone] Health Information Management [place]

## 2016-02-12 NOTE — Progress Notes (Signed)
Arlyn LeakGarnett M Mamone  MRN: [id number]  DOB/AGE: Mar 11, 1921 82 y.o.  Primary Care Physician:FANTA,TESFAYE, MD  Admit date: 02/13/2016  Chief Complaint:  Chief Complaint  Patient presents with  . Hypotension    S-Pt presented on  01/26/2016 with  Chief Complaint  Patient presents with  . Hypotension  .    Pt offers no specific complaints.    Pt main concern is " why are they having such hard time with my blood draw?"    Meds  . enoxaparin (LOVENOX) injection  30 mg Subcutaneous Q24H  . metoprolol tartrate  25 mg Oral BID  . piperacillin-tazobactam (ZOSYN)  IV  2.25 g Intravenous Q8H  . vancomycin  1,500 mg Intravenous Q48H       Physical Exam: Vital signs in last 24 hours: Temp:  [96.4 F (35.8 C)-98.6 F (37 C)] 98.2 F (36.8 C) (04/18 0814) Pulse Rate:  [25-119] 96 (04/18 0700) Resp:  [16-26] 18 (04/18 0800) BP: (68-98)/(53-85) 98/85 mmHg (04/18 0800) SpO2:  [60 %-100 %] 100 % (04/18 0700) FiO2 (%):  [32 %] 32 % (04/18 0814) Weight:  [283 lb 15.2 oz (128.8 kg)] 283 lb 15.2 oz (128.8 kg) (04/18 0500) Weight change:  Last BM Date: 02/10/16  Intake/Output from previous day: 04/17 0701 - 04/18 0700 In: 4390.4 [P.O.:480; I.V.:3310.4; IV Piggyback:600] Out: 425 [Urine:425] Total I/O In: 240 [P.O.:240] Out: -    Physical Exam: General- pt is awake,alert, follows comands Resp- No acute REsp distress,Rhonchi + CVS- S1S2 Irregular in rate and rhythm GIT- BS+, soft, NT, ND EXT- 2+  LE Edema, Cyanosis   Lab Results: CBC  Recent Labs  02/10/16 0459 02/11/16 0454  WBC 7.8 9.4  HGB 12.7 12.8  HCT 39.1 39.2  PLT 138* 84*    BMET  Recent Labs  02/10/16 0459 02/11/16 0454  NA 134* 134*  K 5.1 4.9  CL 102 104  CO2 19* 16*  GLUCOSE 100* 109*  BUN 61* 62*  CREATININE 4.12* 3.96*  CALCIUM 7.9* 8.1*    Creat trend 2017  2.5=>3.8=>4.1=>3.96         2.1--5.3 ( March admission)  2016   1.45 2013    1.3 2010    1.43   MICRO Recent Results (from  the past 240 hour(s))  Blood culture (routine x 2)     Status: None (Preliminary result)   Collection Time: 01/30/2016  7:18 PM  Result Value Ref Range Status   Specimen Description LEFT ANTECUBITAL  Final   Special Requests BOTTLES DRAWN AEROBIC AND ANAEROBIC 6CC  Final   Culture NO GROWTH 3 DAYS  Final   Report Status PENDING  Incomplete  Blood culture (routine x 2)     Status: None (Preliminary result)   Collection Time: 02/01/2016  7:30 PM  Result Value Ref Range Status   Specimen Description BLOOD LEFT WRIST  Final   Special Requests BOTTLES DRAWN AEROBIC ONLY 4CC  Final   Culture NO GROWTH 3 DAYS  Final   Report Status PENDING  Incomplete  MRSA PCR Screening     Status: None   Collection Time: 02/21/2016 11:55 PM  Result Value Ref Range Status   MRSA by PCR NEGATIVE NEGATIVE Final    Comment:        The GeneXpert MRSA Assay (FDA approved for NASAL specimens only), is one component of a comprehensive MRSA colonization surveillance program. It is not intended to diagnose MRSA infection nor to guide or monitor treatment for MRSA infections.  Lab Results  Component Value Date   PTH 85* 01/24/2016   PTH Comment 01/24/2016   CALCIUM 8.1* 02/11/2016   PHOS 4.5 02/10/2016          Impression: 1)Renal  AKI secondary to Prerenal/ATN                AKI sec to Hypotension/Hypovolemia/ACE                AKI on CKD               CKD stage 4.               CKD since 2010               CKD secondary to  Cardiorenal/Age associated decline/Multple AKI                Progression of CKD marked with multiple AKI                2)CVS- admitted with hypotension               Cortisol with normal limits              Hx of CHF                    Pt remains hypotensive             Primary team evaluating for need of  Cardio help  3)CNS- hx of dementia  4)CKD Mineral-Bone Disorder PTH acceptable Secondary Hyperparathyroidism  present Phosphorus at goal.   5)Afib  -Hx of Afib Primary MD following regarding need of Digoxin/Cardiology help          6)Electrolytes  Normokalemic  Hyponatremic   7)Acid base Co2 not at goal Lactic acidosis     Plan:  Will reduce IVF rate Labs pending this am Will revaluate after labs.    BHUTANI,MANPREET S 02/12/2016, 11:08 AM

## 2016-02-12 NOTE — Consult Note (Signed)
   [facility] CM Inpatient Consult   02/12/2016  Michaela Tyler 06/11/1921 [id number]  Met with patient and her spouse, Michaela Tyler at bedside. Discussed [employer]/gallery curator services in depth with patient and spouse. Spouse indicates that at this time patient will discharge back to [facility] facility with the hope of returning home in the future. He does not want to sign up for program services at this point and time, gave [facility] program brochure for future reference, both voiced appreciation of the information.  Of note, [facility] Care Management services would not replace or interfere with any services that are arranged by inpatient case management or social work. For additional questions or referrals please contact:  Royetta Crochet. Laymond Purser, RN, BSN, [facility] Liaison [phone]

## 2016-02-13 DIAGNOSIS — I5043 Acute on chronic combined systolic (congestive) and diastolic (congestive) heart failure: Secondary | ICD-10-CM | POA: Diagnosis present

## 2016-02-13 LAB — CBC
HEMATOCRIT: 36.6 % (ref 36.0–46.0)
HEMOGLOBIN: 11.8 g/dL — AB (ref 12.0–15.0)
MCH: 31.6 pg (ref 26.0–34.0)
MCHC: 32.2 g/dL (ref 30.0–36.0)
MCV: 98.1 fL (ref 78.0–100.0)
Platelets: 156 10*3/uL (ref 150–400)
RBC: 3.73 MIL/uL — AB (ref 3.87–5.11)
RDW: 18.5 % — ABNORMAL HIGH (ref 11.5–15.5)
WBC: 9.6 10*3/uL (ref 4.0–10.5)

## 2016-02-13 LAB — BASIC METABOLIC PANEL
Anion gap: 16 — ABNORMAL HIGH (ref 5–15)
BUN: 61 mg/dL — AB (ref 6–20)
CALCIUM: 8.5 mg/dL — AB (ref 8.9–10.3)
CHLORIDE: 104 mmol/L (ref 101–111)
CO2: 16 mmol/L — ABNORMAL LOW (ref 22–32)
CREATININE: 3.83 mg/dL — AB (ref 0.44–1.00)
GFR, EST AFRICAN AMERICAN: 11 mL/min — AB (ref >60)
GFR, EST NON AFRICAN AMERICAN: 9 mL/min — AB (ref >60)
Glucose, Bld: 89 mg/dL (ref 65–99)
Potassium: 5 mmol/L (ref 3.5–5.1)
SODIUM: 136 mmol/L (ref 135–145)

## 2016-02-13 MED ORDER — AMIODARONE HCL IN DEXTROSE 360-4.14 MG/200ML-% IV SOLN
30.0000 mg/h | INTRAVENOUS | Status: DC
  Administered 2016-02-13 – 2016-02-14 (×4): 30 mg/h via INTRAVENOUS
  Filled 2016-02-13 (×4): qty 200

## 2016-02-13 MED ORDER — AMIODARONE HCL IN DEXTROSE 360-4.14 MG/200ML-% IV SOLN
60.0000 mg/h | INTRAVENOUS | Status: AC
  Administered 2016-02-13: 60 mg/h via INTRAVENOUS
  Filled 2016-02-13: qty 200

## 2016-02-13 MED ORDER — PANTOPRAZOLE SODIUM 40 MG PO TBEC
40.0000 mg | DELAYED_RELEASE_TABLET | Freq: Every day | ORAL | Status: DC
  Administered 2016-02-13 – 2016-02-14 (×2): 40 mg via ORAL
  Filled 2016-02-13 (×2): qty 1

## 2016-02-13 MED ORDER — AMIODARONE LOAD VIA INFUSION
150.0000 mg | Freq: Once | INTRAVENOUS | Status: AC
  Administered 2016-02-13: 150 mg via INTRAVENOUS
  Filled 2016-02-13: qty 83.34

## 2016-02-13 NOTE — Progress Notes (Signed)
Subjective: I am seeing her today for the first time. This is a patient of Dr. Josephine Cables who was admitted with renal failure and heart failure. She has dementia and cannot provide much history. She's had problems with chronic atrial fibrillation and has had some hypotension. Blood pressure this morning is in the 80s but earlier was around 944 systolic. Her heart rate is still about 120.  Objective: Vital signs in last 24 hours: Temp:  [96.1 F (35.6 C)-98.2 F (36.8 C)] 97 F (36.1 C) (04/19 0400) Pulse Rate:  [0-130] 59 (04/19 0500) Resp:  [16-29] 20 (04/19 0500) BP: (37-199)/(27-172) 76/61 mmHg (04/19 0500) SpO2:  [28 %-100 %] 93 % (04/19 0500) FiO2 (%):  [32 %] 32 % (04/18 0814) Weight:  [128.8 kg (283 lb 15.2 oz)] 128.8 kg (283 lb 15.2 oz) (04/19 0500) Weight change: 0 kg (0 lb) Last BM Date: 02/10/16  Intake/Output from previous day: 04/18 0701 - 04/19 0700 In: 1226.3 [P.O.:720; I.V.:506.3] Out: 275 [Urine:275]  PHYSICAL EXAM General appearance: alert and mild distress Resp: rhonchi bilaterally Cardio: Irregularly irregular with tachycardia and no definite gallop GI: soft, non-tender; bowel sounds normal; no masses,  no organomegaly Extremities: She still has significant edema of the lower extremities  Lab Results:  Results for orders placed or performed during the hospital encounter of 01/30/2016 (from the past 48 hour(s))  Basic metabolic panel     Status: Abnormal   Collection Time: 02/12/16 10:53 AM  Result Value Ref Range   Sodium 136 135 - 145 mmol/L   Potassium 4.8 3.5 - 5.1 mmol/L   Chloride 103 101 - 111 mmol/L   CO2 17 (L) 22 - 32 mmol/L   Glucose, Bld 112 (H) 65 - 99 mg/dL   BUN 62 (H) 6 - 20 mg/dL   Creatinine, Ser 3.80 (H) 0.44 - 1.00 mg/dL   Calcium 8.4 (L) 8.9 - 10.3 mg/dL   GFR calc non Af Amer 9 (L) >60 mL/min   GFR calc Af Amer 11 (L) >60 mL/min    Comment: (NOTE) The eGFR has been calculated using the CKD EPI equation. This calculation has not been  validated in all clinical situations. eGFR's persistently <60 mL/min signify possible Chronic Kidney Disease.    Anion gap 16 (H) 5 - 15  Basic metabolic panel     Status: Abnormal   Collection Time: 02/13/16  4:03 AM  Result Value Ref Range   Sodium 136 135 - 145 mmol/L   Potassium 5.0 3.5 - 5.1 mmol/L   Chloride 104 101 - 111 mmol/L   CO2 16 (L) 22 - 32 mmol/L   Glucose, Bld 89 65 - 99 mg/dL   BUN 61 (H) 6 - 20 mg/dL   Creatinine, Ser 3.83 (H) 0.44 - 1.00 mg/dL   Calcium 8.5 (L) 8.9 - 10.3 mg/dL   GFR calc non Af Amer 9 (L) >60 mL/min   GFR calc Af Amer 11 (L) >60 mL/min    Comment: (NOTE) The eGFR has been calculated using the CKD EPI equation. This calculation has not been validated in all clinical situations. eGFR's persistently <60 mL/min signify possible Chronic Kidney Disease.    Anion gap 16 (H) 5 - 15  CBC     Status: Abnormal   Collection Time: 02/13/16  4:03 AM  Result Value Ref Range   WBC 9.6 4.0 - 10.5 K/uL   RBC 3.73 (L) 3.87 - 5.11 MIL/uL   Hemoglobin 11.8 (L) 12.0 - 15.0 g/dL   HCT  36.6 36.0 - 46.0 %   MCV 98.1 78.0 - 100.0 fL   MCH 31.6 26.0 - 34.0 pg   MCHC 32.2 30.0 - 36.0 g/dL   RDW 18.5 (H) 11.5 - 15.5 %   Platelets 156 150 - 400 K/uL    Comment: PLATELET COUNT CONFIRMED BY SMEAR    ABGS No results for input(s): PHART, PO2ART, TCO2, HCO3 in the last 72 hours.  Invalid input(s): PCO2 CULTURES Recent Results (from the past 240 hour(s))  Blood culture (routine x 2)     Status: None (Preliminary result)   Collection Time: 02/21/2016  7:18 PM  Result Value Ref Range Status   Specimen Description LEFT ANTECUBITAL  Final   Special Requests BOTTLES DRAWN AEROBIC AND ANAEROBIC 6CC  Final   Culture NO GROWTH 4 DAYS  Final   Report Status PENDING  Incomplete  Blood culture (routine x 2)     Status: None (Preliminary result)   Collection Time: 02/22/2016  7:30 PM  Result Value Ref Range Status   Specimen Description BLOOD LEFT WRIST  Final   Special  Requests BOTTLES DRAWN AEROBIC ONLY 4CC  Final   Culture NO GROWTH 4 DAYS  Final   Report Status PENDING  Incomplete  MRSA PCR Screening     Status: None   Collection Time: 02/03/2016 11:55 PM  Result Value Ref Range Status   MRSA by PCR NEGATIVE NEGATIVE Final    Comment:        The GeneXpert MRSA Assay (FDA approved for NASAL specimens only), is one component of a comprehensive MRSA colonization surveillance program. It is not intended to diagnose MRSA infection nor to guide or monitor treatment for MRSA infections.    Studies/Results: No results found.  Medications:  Prior to Admission:  Prescriptions prior to admission  Medication Sig Dispense Refill Last Dose  . ALPRAZolam (XANAX) 0.25 MG tablet Take 1 tablet (0.25 mg total) by mouth 2 (two) times daily as needed for anxiety. 20 tablet 0 02/07/2016 at Unknown time  . aspirin EC 81 MG tablet Take 81 mg by mouth daily. Monitor for s/s of bleeding   02/18/2016 at Unknown time  . citalopram (CELEXA) 10 MG tablet Take 10 mg by mouth at bedtime.   3 02/07/2016 at Unknown time  . ENSURE (ENSURE) Take 237 mLs by mouth daily.   02/07/2016 at Unknown time  . ferrous sulfate 325 (65 FE) MG tablet Take 325 mg by mouth daily with breakfast.   01/27/2016 at Unknown time  . levothyroxine (SYNTHROID, LEVOTHROID) 88 MCG tablet Take 1 tablet (88 mcg total) by mouth daily. 90 tablet 3 02/03/2016 at Unknown time  . losartan (COZAAR) 25 MG tablet Take 25 mg by mouth daily.   02/07/2016 at Unknown time  . magnesium oxide (MAG-OX) 400 (241.3 Mg) MG tablet Take 1 tablet (400 mg total) by mouth 2 (two) times daily. 60 tablet 3 02/12/2016 at Unknown time  . Metoprolol Tartrate 75 MG TABS Take 1 tablet by mouth twice a day   01/29/2016 at 0900  . omeprazole (PRILOSEC) 20 MG capsule Take 20 mg by mouth daily.  3 02/07/2016 at Unknown time  . potassium chloride (K-DUR) 10 MEQ tablet Take 20 mEq by mouth daily.   02/21/2016 at Unknown time  . promethazine (PHENERGAN)  25 MG tablet Take 12.5 mg by mouth every 6 (six) hours as needed for nausea or vomiting.   02/21/2016 at Unknown time  . torsemide (DEMADEX) 20 MG tablet Take 2  tablets (40 mg total) by mouth daily. 30 tablet 0 02/10/2016 at Unknown time  . traMADol (ULTRAM) 50 MG tablet Take 1 tablet (50 mg total) by mouth every 12 (twelve) hours as needed. 60 tablet 0 Past Week at Unknown time  . Vitamin D, Ergocalciferol, (DRISDOL) 50000 units CAPS capsule Take 1 capsule (50,000 Units total) by mouth every 7 (seven) days. 4 capsule 0 Past Week at Unknown time   Scheduled: . enoxaparin (LOVENOX) injection  30 mg Subcutaneous Q24H  . metoprolol tartrate  25 mg Oral BID  . piperacillin-tazobactam (ZOSYN)  IV  2.25 g Intravenous Q8H   Continuous: . sodium chloride 1,000 mL (02/13/16 0431)   PPH:[id number], morphine injection, ondansetron **OR** ondansetron (ZOFRAN) IV  Assesment:She was admitted with presumed sepsis. She has chronic atrial fibrillation. She has DO NOT RESUSCITATE status and her husband does not want pressors but he is agreeable to her having antibiotics and IV fluids.  She has what looks like a stage II more of a skin tear than a decubitus ulcer just above the cleft of her buttocks. Principal Problem:   Sepsis ([place]) Active Problems:   Chronic atrial fibrillation (H[facility])   AKI (acute kidney injury) ([place])   Paroxysmal supraventricular tachycardia (H[facility])   Hypotension   Dementia   CKD (chronic kidney disease)   Lactic acid acidosis   CHF, chronic (H[facility])   DNR (do not resuscitate)   Decubitus skin ulcer   Acute on chronic renal insufficiency ([place])   Acute on chronic combined systolic and diastolic heart failure ([place])    Plan:I will plan to continue with current treatments. Echocardiogram has shown both systolic and diastolic dysfunction. I will plan to add amiodarone and see if we can get her heart rate better which I think may improve her overall situation. Continue with fluids etc. per  nephrology. I had contemplated cardiology consultation but considering her overall situation I think I will hold off on that   LOS: 5 days   Turki Tapanes L 02/13/2016, 7:36 AM

## 2016-02-13 NOTE — Progress Notes (Addendum)
Pt c/o stomach hurting, unable to describe fully and rate, states "may be acid".  Pt takes Omeprazole PTA, not ordered at this time.  Gave Zofran PRN and notifying Dr Juanetta GoslingHawkins (On call).   Pt vomited small amount.   Order for Protonix 40 mg PO Daily obtained.  Will continue to monitor pt.

## 2016-02-13 NOTE — Progress Notes (Signed)
Michaela Tyler  MRN: [id number]  DOB/AGE: 02-Apr-1921 82 y.o.  Primary Care Physician:FANTA,TESFAYE, MD  Admit date: 01/31/2016  Chief Complaint:  Chief Complaint  Patient presents with  . Hypotension    S-Pt presented on  02/11/2016 with  Chief Complaint  Patient presents with  . Hypotension  .    Pt offers no specific complaints.    Pt main concern remains  is " they having such hard time with my blood draw."    Meds  . enoxaparin (LOVENOX) injection  30 mg Subcutaneous Q24H  . metoprolol tartrate  25 mg Oral BID  . pantoprazole  40 mg Oral Daily  . piperacillin-tazobactam (ZOSYN)  IV  2.25 g Intravenous Q8H       Physical Exam: Vital signs in last 24 hours: Temp:  [96.9 F (36.1 C)-97.4 F (36.3 C)] 97.4 F (36.3 C) (04/19 1200) Pulse Rate:  [0-127] 46 (04/19 1430) Resp:  [15-29] 20 (04/19 1445) BP: (37-199)/(22-172) 72/59 mmHg (04/19 1445) SpO2:  [62 %-100 %] 96 % (04/19 1430) Weight:  [283 lb 15.2 oz (128.8 kg)] 283 lb 15.2 oz (128.8 kg) (04/19 0500) Weight change: 0 lb (0 kg) Last BM Date: 02/12/16 (last night per report)  Intake/Output from previous day: 04/18 0701 - 04/19 0700 In: 1226.3 [P.O.:720; I.V.:506.3] Out: 275 [Urine:275] Total I/O In: 50 [P.O.:50] Out: 151 [Urine:150; Stool:1]   Physical Exam: General- pt is awake,alert, follows comands Resp- No acute REsp distress,Rhonchi + CVS- S1S2 Irregular in rate and rhythm GIT- BS+, soft, NT, ND EXT- 2+  LE Edema, Cyanosis   Lab Results: CBC  Recent Labs  02/11/16 0454 02/13/16 0403  WBC 9.4 9.6  HGB 12.8 11.8*  HCT 39.2 36.6  PLT 84* 156    BMET  Recent Labs  02/12/16 1053 02/13/16 0403  NA 136 136  K 4.8 5.0  CL 103 104  CO2 17* 16*  GLUCOSE 112* 89  BUN 62* 61*  CREATININE 3.80* 3.83*  CALCIUM 8.4* 8.5*    Creat trend 2017  2.5=>3.8=>4.1=>3.96=>3.83         2.1--5.3 ( March admission)  2016   1.45 2013    1.3 2010    1.43   MICRO Recent Results (from the  past 240 hour(s))  Blood culture (routine x 2)     Status: None (Preliminary result)   Collection Time: 02/17/2016  7:18 PM  Result Value Ref Range Status   Specimen Description LEFT ANTECUBITAL  Final   Special Requests BOTTLES DRAWN AEROBIC AND ANAEROBIC 6CC  Final   Culture NO GROWTH 4 DAYS  Final   Report Status PENDING  Incomplete  Blood culture (routine x 2)     Status: None (Preliminary result)   Collection Time: 02/24/2016  7:30 PM  Result Value Ref Range Status   Specimen Description BLOOD LEFT WRIST  Final   Special Requests BOTTLES DRAWN AEROBIC ONLY 4CC  Final   Culture NO GROWTH 4 DAYS  Final   Report Status PENDING  Incomplete  MRSA PCR Screening     Status: None   Collection Time: 02/22/2016 11:55 PM  Result Value Ref Range Status   MRSA by PCR NEGATIVE NEGATIVE Final    Comment:        The GeneXpert MRSA Assay (FDA approved for NASAL specimens only), is one component of a comprehensive MRSA colonization surveillance program. It is not intended to diagnose MRSA infection nor to guide or monitor treatment for MRSA infections.  Lab Results  Component Value Date   PTH 85* 01/24/2016   PTH Comment 01/24/2016   CALCIUM 8.5* 02/13/2016   PHOS 4.5 02/10/2016          Impression: 1)Renal  AKI secondary to Prerenal/ATN                AKI sec to Hypotension/Hypovolemia/ACE                AKI on CKD               CKD stage 4.               CKD since 2010               CKD secondary to  Cardiorenal/Age associated decline/Multple AKI                Progression of CKD marked with multiple AKI                2)CVS- admitted with hypotension, now better               Cortisol with normal limits              Hx of CHF                  Primary team evaluating for need of  Cardio help  3)CNS- hx of dementia  4)CKD Mineral-Bone Disorder PTH acceptable Secondary Hyperparathyroidism  present Phosphorus at goal.   5)Afib -Hx of Afib Primary MD  following. Now on IV amiodarone           6)Electrolytes  Normokalemic  Hyponatremic     Now better  7)Acid base Co2 not at goal Lactic acidosis     Plan:  Will continue current care    Devanta Daniel S 02/13/2016, 3:36 PM

## 2016-02-14 ENCOUNTER — Inpatient Hospital Stay (HOSPITAL_COMMUNITY): Payer: Medicare Other

## 2016-02-14 LAB — CULTURE, BLOOD (ROUTINE X 2)
CULTURE: NO GROWTH
CULTURE: NO GROWTH

## 2016-02-14 MED ORDER — FUROSEMIDE 10 MG/ML IJ SOLN
40.0000 mg | Freq: Two times a day (BID) | INTRAMUSCULAR | Status: DC
  Administered 2016-02-14 (×2): 40 mg via INTRAVENOUS
  Filled 2016-02-14 (×2): qty 4

## 2016-02-14 MED ORDER — ALUM & MAG HYDROXIDE-SIMETH 200-200-20 MG/5ML PO SUSP
30.0000 mL | ORAL | Status: DC | PRN
  Administered 2016-02-14: 30 mL via ORAL
  Filled 2016-02-14: qty 30

## 2016-02-14 NOTE — Progress Notes (Signed)
Michaela Tyler  MRN: [id number]  DOB/AGE: February 13, 1921 82 y.o.  Primary Care Physician:FANTA,TESFAYE, MD  Admit date: 04-14-16  Chief Complaint:  Chief Complaint  Patient presents with  . Hypotension    S-Pt presented on  04-14-16 with  Chief Complaint  Patient presents with  . Hypotension  .   Pt offers no specific complaints.   Pt husband and niece ( POA) are present in the room.   Pt family is thinking about palliative care/hospice /comfort care.    Meds  . enoxaparin (LOVENOX) injection  30 mg Subcutaneous Q24H  . furosemide  40 mg Intravenous Q12H  . metoprolol tartrate  25 mg Oral BID  . pantoprazole  40 mg Oral Daily  . piperacillin-tazobactam (ZOSYN)  IV  2.25 g Intravenous Q8H       Physical Exam: Vital signs in last 24 hours: Temp:  [96.8 F (36 C)-97.9 F (36.6 C)] 96.8 F (36 C) (04/20 0400) Pulse Rate:  [25-111] 77 (04/20 1000) Resp:  [15-28] 22 (04/20 1100) BP: (64-143)/(31-120) 83/61 mmHg (04/20 1100) SpO2:  [70 %-100 %] 81 % (04/20 1000) Weight:  [292 lb 1.8 oz (132.5 kg)] 292 lb 1.8 oz (132.5 kg) (04/20 0500) Weight change: 8 lb 2.5 oz (3.7 kg) Last BM Date: 02/13/16  Intake/Output from previous day: 04/19 0701 - 04/20 0700 In: 2750 [P.O.:50; I.V.:2700] Out: 251 [Urine:250; Stool:1]     Physical Exam: General- pt is awake,alert, follows comands Resp- Mild REsp distress,Rhonchi + CVS- S1S2 Irregular in rate and rhythm GIT- BS+, soft, NT, ND EXT- 2+  LE Edema  Lab Results: CBC  Recent Labs  02/13/16 0403  WBC 9.6  HGB 11.8*  HCT 36.6  PLT 156    BMET  Recent Labs  02/12/16 1053 02/13/16 0403  NA 136 136  K 4.8 5.0  CL 103 104  CO2 17* 16*  GLUCOSE 112* 89  BUN 62* 61*  CREATININE 3.80* 3.83*  CALCIUM 8.4* 8.5*    Creat trend 2017  2.5=>3.8=>4.1=>3.96=>3.83         2.1--5.3 ( March admission)  2016   1.45 2013    1.3 2010    1.43   MICRO Recent Results (from the past 240 hour(s))  Blood culture  (routine x 2)     Status: None   Collection Time: Nov 02, 2015  7:18 PM  Result Value Ref Range Status   Specimen Description LEFT ANTECUBITAL  Final   Special Requests BOTTLES DRAWN AEROBIC AND ANAEROBIC 6CC  Final   Culture NO GROWTH 6 DAYS  Final   Report Status 01/28/2016 FINAL  Final  Blood culture (routine x 2)     Status: None   Collection Time: Nov 02, 2015  7:30 PM  Result Value Ref Range Status   Specimen Description BLOOD LEFT WRIST  Final   Special Requests BOTTLES DRAWN AEROBIC ONLY 4CC  Final   Culture NO GROWTH 6 DAYS  Final   Report Status 02/23/2016 FINAL  Final  MRSA PCR Screening     Status: None   Collection Time: Nov 02, 2015 11:55 PM  Result Value Ref Range Status   MRSA by PCR NEGATIVE NEGATIVE Final    Comment:        The GeneXpert MRSA Assay (FDA approved for NASAL specimens only), is one component of a comprehensive MRSA colonization surveillance program. It is not intended to diagnose MRSA infection nor to guide or monitor treatment for MRSA infections.       Lab Results  Component Value  Date   PTH 85* 01/24/2016   PTH Comment 01/24/2016   CALCIUM 8.5* 02/13/2016   PHOS 4.5 02/10/2016          Impression: 1)Renal  AKI secondary to Prerenal/ATN                AKI sec to Hypotension/Hypovolemia/ACE                AKI on CKD               CKD stage 4.               CKD since 2010               CKD secondary to  Cardiorenal/Age associated decline/Multple AKI                Progression of CKD marked with multiple AKI                2)CVS- admitted with hypotension, now better               Cortisol with normal limits              Hx of CHF                  Primary team evaluating for need of  Cardio help  3)CNS- hx of dementia  4)CKD Mineral-Bone Disorder PTH acceptable Secondary Hyperparathyroidism  present Phosphorus at goal.   5)Afib -Hx of Afib Primary MD following. Now on  amiodarone            6)Electrolytes  Normokalemic  Hyponatremic     Now better  7)Acid base Co2 not at goal Lactic acidosis     Plan:  Will continue current conservative care I did discuss pt clinical condition with Pt, her husband and her niece. Pt and family do not wish for renal replacement therapy. Pt and family are leaning towards palliative care.   BHUTANI,MANPREET S 01/28/2016, 11:41 AM

## 2016-02-14 NOTE — Clinical Social Work Note (Signed)
[facility]     Mishawn Hemann, Juleen ChinaHeather D, LCSW

## 2016-02-14 NOTE — Progress Notes (Signed)
PT EXPIRED AT 2157. FAMILY AT BEDSIDE. [place] [facility] CALLED. PT NOT DONOR CANIDATED D/T AGE.  PT HAS NOW TAKEN PT'S BELONGINGS HOME EXCEPT HER GLASSES AND BLANKLET.THESE WERE LEFT BEHIND. THEY WILL BE SENT W/ PT TO [facility].

## 2016-02-14 NOTE — Progress Notes (Signed)
Subjective: She looks significantly worse to me today. She is more short of breath. Her blood pressure is still low. Her heart rate is somewhat better. The lab was unable to get blood.  Objective: Vital signs in last 24 hours: Temp:  [96.8 F (36 C)-97.9 F (36.6 C)] 96.8 F (36 C) (04/20 0400) Pulse Rate:  [25-111] 25 (04/20 0800) Resp:  [15-28] 22 (04/20 0800) BP: (46-143)/(22-120) 113/92 mmHg (04/20 0800) SpO2:  [70 %-100 %] 90 % (04/20 0816) Weight:  [132.5 kg (292 lb 1.8 oz)] 132.5 kg (292 lb 1.8 oz) (04/20 0500) Weight change: 3.7 kg (8 lb 2.5 oz) Last BM Date: 02/13/16  Intake/Output from previous day: 04/19 0701 - 04/20 0700 In: 2750 [P.O.:50; I.V.:2700] Out: 251 [Urine:250; Stool:1]  PHYSICAL EXAM General appearance: alert and moderate distress Resp: She looks dyspneic at rest. She has rhonchi bilaterally Cardio: irregularly irregular rhythm and Her tachycardia is better on amiodarone GI: soft, non-tender; bowel sounds normal; no masses,  no organomegaly Extremities: 2-3+ edema bilaterally  Lab Results:  Results for orders placed or performed during the hospital encounter of 01/29/2016 (from the past 48 hour(s))  Basic metabolic panel     Status: Abnormal   Collection Time: 02/12/16 10:53 AM  Result Value Ref Range   Sodium 136 135 - 145 mmol/L   Potassium 4.8 3.5 - 5.1 mmol/L   Chloride 103 101 - 111 mmol/L   CO2 17 (L) 22 - 32 mmol/L   Glucose, Bld 112 (H) 65 - 99 mg/dL   BUN 62 (H) 6 - 20 mg/dL   Creatinine, Ser 3.80 (H) 0.44 - 1.00 mg/dL   Calcium 8.4 (L) 8.9 - 10.3 mg/dL   GFR calc non Af Amer 9 (L) >60 mL/min   GFR calc Af Amer 11 (L) >60 mL/min    Comment: (NOTE) The eGFR has been calculated using the CKD EPI equation. This calculation has not been validated in all clinical situations. eGFR's persistently <60 mL/min signify possible Chronic Kidney Disease.    Anion gap 16 (H) 5 - 15  Basic metabolic panel     Status: Abnormal   Collection Time:  02/13/16  4:03 AM  Result Value Ref Range   Sodium 136 135 - 145 mmol/L   Potassium 5.0 3.5 - 5.1 mmol/L   Chloride 104 101 - 111 mmol/L   CO2 16 (L) 22 - 32 mmol/L   Glucose, Bld 89 65 - 99 mg/dL   BUN 61 (H) 6 - 20 mg/dL   Creatinine, Ser 3.83 (H) 0.44 - 1.00 mg/dL   Calcium 8.5 (L) 8.9 - 10.3 mg/dL   GFR calc non Af Amer 9 (L) >60 mL/min   GFR calc Af Amer 11 (L) >60 mL/min    Comment: (NOTE) The eGFR has been calculated using the CKD EPI equation. This calculation has not been validated in all clinical situations. eGFR's persistently <60 mL/min signify possible Chronic Kidney Disease.    Anion gap 16 (H) 5 - 15  CBC     Status: Abnormal   Collection Time: 02/13/16  4:03 AM  Result Value Ref Range   WBC 9.6 4.0 - 10.5 K/uL   RBC 3.73 (L) 3.87 - 5.11 MIL/uL   Hemoglobin 11.8 (L) 12.0 - 15.0 g/dL   HCT 36.6 36.0 - 46.0 %   MCV 98.1 78.0 - 100.0 fL   MCH 31.6 26.0 - 34.0 pg   MCHC 32.2 30.0 - 36.0 g/dL   RDW 18.5 (H) 11.5 - 15.5 %  Platelets 156 150 - 400 K/uL    Comment: PLATELET COUNT CONFIRMED BY SMEAR    ABGS No results for input(s): PHART, PO2ART, TCO2, HCO3 in the last 72 hours.  Invalid input(s): PCO2 CULTURES Recent Results (from the past 240 hour(s))  Blood culture (routine x 2)     Status: None (Preliminary result)   Collection Time: 01/31/2016  7:18 PM  Result Value Ref Range Status   Specimen Description LEFT ANTECUBITAL  Final   Special Requests BOTTLES DRAWN AEROBIC AND ANAEROBIC 6CC  Final   Culture NO GROWTH 4 DAYS  Final   Report Status PENDING  Incomplete  Blood culture (routine x 2)     Status: None (Preliminary result)   Collection Time: 02/11/2016  7:30 PM  Result Value Ref Range Status   Specimen Description BLOOD LEFT WRIST  Final   Special Requests BOTTLES DRAWN AEROBIC ONLY 4CC  Final   Culture NO GROWTH 4 DAYS  Final   Report Status PENDING  Incomplete  MRSA PCR Screening     Status: None   Collection Time: 01/31/2016 11:55 PM  Result Value  Ref Range Status   MRSA by PCR NEGATIVE NEGATIVE Final    Comment:        The GeneXpert MRSA Assay (FDA approved for NASAL specimens only), is one component of a comprehensive MRSA colonization surveillance program. It is not intended to diagnose MRSA infection nor to guide or monitor treatment for MRSA infections.    Studies/Results: No results found.  Medications:  Prior to Admission:  Prescriptions prior to admission  Medication Sig Dispense Refill Last Dose  . ALPRAZolam (XANAX) 0.25 MG tablet Take 1 tablet (0.25 mg total) by mouth 2 (two) times daily as needed for anxiety. 20 tablet 0 01/28/2016 at Unknown time  . aspirin EC 81 MG tablet Take 81 mg by mouth daily. Monitor for s/s of bleeding   02/20/2016 at Unknown time  . citalopram (CELEXA) 10 MG tablet Take 10 mg by mouth at bedtime.   3 02/07/2016 at Unknown time  . ENSURE (ENSURE) Take 237 mLs by mouth daily.   02/07/2016 at Unknown time  . ferrous sulfate 325 (65 FE) MG tablet Take 325 mg by mouth daily with breakfast.   02/13/2016 at Unknown time  . levothyroxine (SYNTHROID, LEVOTHROID) 88 MCG tablet Take 1 tablet (88 mcg total) by mouth daily. 90 tablet 3 02/06/2016 at Unknown time  . losartan (COZAAR) 25 MG tablet Take 25 mg by mouth daily.   01/29/2016 at Unknown time  . magnesium oxide (MAG-OX) 400 (241.3 Mg) MG tablet Take 1 tablet (400 mg total) by mouth 2 (two) times daily. 60 tablet 3 01/29/2016 at Unknown time  . Metoprolol Tartrate 75 MG TABS Take 1 tablet by mouth twice a day   02/21/2016 at 0900  . omeprazole (PRILOSEC) 20 MG capsule Take 20 mg by mouth daily.  3 02/07/2016 at Unknown time  . potassium chloride (K-DUR) 10 MEQ tablet Take 20 mEq by mouth daily.   02/21/2016 at Unknown time  . promethazine (PHENERGAN) 25 MG tablet Take 12.5 mg by mouth every 6 (six) hours as needed for nausea or vomiting.   02/04/2016 at Unknown time  . torsemide (DEMADEX) 20 MG tablet Take 2 tablets (40 mg total) by mouth daily. 30 tablet  0 02/10/2016 at Unknown time  . traMADol (ULTRAM) 50 MG tablet Take 1 tablet (50 mg total) by mouth every 12 (twelve) hours as needed. 60 tablet 0 Past Week  at Unknown time  . Vitamin D, Ergocalciferol, (DRISDOL) 50000 units CAPS capsule Take 1 capsule (50,000 Units total) by mouth every 7 (seven) days. 4 capsule 0 Past Week at Unknown time   Scheduled: . enoxaparin (LOVENOX) injection  30 mg Subcutaneous Q24H  . furosemide  40 mg Intravenous Q12H  . metoprolol tartrate  25 mg Oral BID  . pantoprazole  40 mg Oral Daily  . piperacillin-tazobactam (ZOSYN)  IV  2.25 g Intravenous Q8H   Continuous: . sodium chloride 1,000 mL (01/30/2016 0733)  . amiodarone 30 mg/hr (01/30/2016 0400)   QZE:SPQZRAQTMA, alum & mag hydroxide-simeth, morphine injection, ondansetron **OR** ondansetron (ZOFRAN) IV  Assesment: It appears that she has some heart failure symptoms now and some volume overload. She is known to have chronic combined systolic and diastolic heart failure and seems to have acute exacerbation now. She has acute on chronic renal insufficiency and we've been giving her fluids to try to improve her renal situation which has not been very successful. She has chronic atrial fibrillation now on amiodarone. She is generally not doing well. Her blood pressure is still low. Principal Problem:   Sepsis ([place]) Active Problems:   Chronic atrial fibrillation (HCC)   AKI (acute kidney injury) (HCC)   Paroxysmal supraventricular tachycardia (HCC)   Hypotension   Dementia   CKD (chronic kidney disease)   Lactic acid acidosis   CHF, chronic (HCC)   DNR (do not resuscitate)   Decubitus skin ulcer   Acute on chronic renal insufficiency (HCC)   Acute on chronic combined systolic and diastolic heart failure ([place])    Plan: I discussed her situation by phone with her husband. I told her that I'm concerned that she will not survive this episode and he understands. We're going to obtain palliative care consult for  help with goal setting and seeing where we need to go from here. I am concerned that when I had Lasix which I think I'm going to have to do now that I will precipitate worsening problems with renal failure and perhaps worsening hypotension.    LOS: 6 days   Shirleymae Hauth L 02/07/2016, 8:54 AM

## 2016-02-14 NOTE — Progress Notes (Signed)
GASPING RESPIRATION. VERY LOW BP. hoarseness 70'S. HUSBAND AND NIECE AT BEDSIDE.

## 2016-02-14 NOTE — Progress Notes (Signed)
Pharmacy Antibiotic Note-follow up  Michaela Tyler is a 82 y.o. female admitted on 12-05-2015 with sepsis.  Pharmacy has been consulted for Zosyn dosing. Per MD patient looks worse today and is more SOB.  Appears she is is having some heart failure symptoms and volume overload. Day#7 of zosyn. Blood and urine cxs ngtd.   Plan: Zosyn 2.25gm IV every 8 Hours   Height: 5\' 11"  (180.3 cm) Weight: 292 lb 1.8 oz (132.5 kg) IBW/kg (Calculated) : 70.8  Temp (24hrs), Avg:97.3 F (36.3 C), Min:96.8 F (36 C), Max:97.9 F (36.6 C)   Recent Labs Lab May 01, 2016 1840  May 01, 2016 1918 May 01, 2016 2105 02/09/16 0125 02/09/16 0412 02/10/16 0459 02/11/16 0454 02/12/16 1053 02/13/16 0403  WBC  --   --  7.0  --   --  7.2 7.8 9.4  --  9.6  CREATININE  --   < > 3.85*  --   --  3.78* 4.12* 3.96* 3.80* 3.83*  LATICACIDVEN 3.9*  --   --  3.8* 3.3* 3.3*  --   --   --   --   < > = values in this interval not displayed.  Estimated Creatinine Clearance: 14.1 mL/min (by C-G formula based on Cr of 3.83).    Antimicrobials this admission: Vancomycin 4/14 >> 4/18  Zosyn 4/14 >>   Microbiology results: 4/14 BCx: ngtd 4/1 UCx: negative 4/14 MRSA PCR: negative  Thank you for allowing pharmacy to be a part of this patient's care. Elder CyphersLorie Kamaiya Antilla, BS Pharm D, [place] Clinical Pharmacist Pager [phone] 02/11/2016 11:09 AM

## 2016-02-14 NOTE — Progress Notes (Signed)
Unable to obtained ordered AM lab work. Dr. Juanetta GoslingHawkins made aware via phone call.

## 2016-02-25 NOTE — Progress Notes (Signed)
[facility] HERE TO PICK UP BODY. PT'S GLASSES AND BLANKET SENT W/ HER. [employer]EDCONTROL NOTIFIED.

## 2016-02-25 NOTE — Discharge Summary (Signed)
Physician Discharge Summary  Patient ID: Michaela Tyler MRN: [id number] DOB/AGE: 80/28/1922 80 y.o. Primary Care Physician:FANTA,TESFAYE, MD Admit date: 02/09/2016 Discharge date: 02/21/2016    Discharge Diagnoses:   Principal Problem:   Sepsis [facility]) Active Problems:   Chronic atrial fibrillation (HCC)   AKI (acute kidney injury) (HCC)   Paroxysmal supraventricular tachycardia (HCC)   Hypotension   Dementia   CKD (chronic kidney disease)   Lactic acid acidosis   CHF, chronic (HCC)   DNR (do not resuscitate)   Decubitus skin ulcer   Acute on chronic renal insufficiency (HCC)   Acute on chronic combined systolic and diastolic heart failure (HCC)     Medication List    ASK your doctor about these medications        ALPRAZolam 0.25 MG tablet  Commonly known as:  XANAX  Take 1 tablet (0.25 mg total) by mouth 2 (two) times daily as needed for anxiety.     aspirin EC 81 MG tablet  Take 81 mg by mouth daily. Monitor for s/s of bleeding     citalopram 10 MG tablet  Commonly known as:  CELEXA  Take 10 mg by mouth at bedtime.     ENSURE  Take 237 mLs by mouth daily.     ferrous sulfate 325 (65 FE) MG tablet  Take 325 mg by mouth daily with breakfast.     levothyroxine 88 MCG tablet  Commonly known as:  SYNTHROID, LEVOTHROID  Take 1 tablet (88 mcg total) by mouth daily.     losartan 25 MG tablet  Commonly known as:  COZAAR  Take 25 mg by mouth daily.     magnesium oxide 400 (241.3 Mg) MG tablet  Commonly known as:  MAG-OX  Take 1 tablet (400 mg total) by mouth 2 (two) times daily.     Metoprolol Tartrate 75 MG Tabs  Take 1 tablet by mouth twice a day     omeprazole 20 MG capsule  Commonly known as:  PRILOSEC  Take 20 mg by mouth daily.     potassium chloride 10 MEQ tablet  Commonly known as:  K-DUR  Take 20 mEq by mouth daily.     promethazine 25 MG tablet  Commonly known as:  PHENERGAN  Take 12.5 mg by mouth every 6 (six) hours as needed for nausea or  vomiting.     torsemide 20 MG tablet  Commonly known as:  DEMADEX  Take 2 tablets (40 mg total) by mouth daily.     traMADol 50 MG tablet  Commonly known as:  ULTRAM  Take 1 tablet (50 mg total) by mouth every 12 (twelve) hours as needed.     Vitamin D (Ergocalciferol) 50000 units Caps capsule  Commonly known as:  DRISDOL  Take 1 capsule (50,000 Units total) by mouth every 7 (seven) days.        Discharged Condition:Deceased    Consults: Nephrology  Significant Diagnostic Studies: Dg Chest 1 View  01/23/2016  CLINICAL DATA:  CHF EXAM: CHEST 1 VIEW COMPARISON:  01/01/2016 FINDINGS: Cardiac shadow is enlarged. Mild vascular congestion is noted. No focal pulmonary edema is seen. No focal infiltrate is noted. No bony abnormality is seen. IMPRESSION: Vascular congestion without significant pulmonary edema. Electronically Signed   By: Alcide Clever M.D.   On: 01/23/2016 16:34   US Renal  01/23/2016  CLINICAL DATA:  Acute renal failure EXAM: RENAL / URINARY TRACT ULTRASOUND COMPLETE COMPARISON:  01/04/2016 FINDINGS: Right Kidney: Length: 9.7 cm. A  parapelvic cyst is noted in the upper pole measuring approximately 1.4 cm in greatest dimension. Left Kidney: Length: 10 cm. A few scattered cysts are noted new. The largest of these measures 1.1 cm in greatest dimension. Bladder: Appears normal for degree of bladder distention. Incidental note is made of a left-sided pleural effusion. IMPRESSION: Bilateral renal cystic change. Electronically Signed   By: Alcide CleverMark  Lukens M.D.   On: 01/23/2016 11:08   Dg Chest Port 1 View  2016/05/22  CLINICAL DATA:  Respiratory failure. EXAM: PORTABLE CHEST 1 VIEW COMPARISON:  02/05/2016 FINDINGS: Cardiac silhouette is mildly enlarged. No mediastinal or hilar masses or evidence of adenopathy. Enlargement of the main pulmonary artery is stable. No evidence of pneumonia or pulmonary edema. No convincing pleural effusion and no pneumothorax. Bony thorax is demineralized  but grossly intact. IMPRESSION: 1. No acute cardiopulmonary disease. 2. Mild cardiomegaly. Enlarged pulmonary arteries again suggesting an element of pulmonary arterial hypertension. Electronically Signed   By: Amie Portlandavid  Ormond M.D.   On: 02017/07/27 11:16   Dg Chest Portable 1 View  02/23/2016  CLINICAL DATA:  Hypotension, dyspnea. EXAM: PORTABLE CHEST 1 VIEW COMPARISON:  02/01/2016 FINDINGS: Moderate cardiomegaly, unchanged. Elevation of the right hemidiaphragm, unchanged. Mildly prominent hilar contours, likely vascular. The lungs are clear. No large effusions. No pneumothorax. IMPRESSION: Cardiomegaly. Prominent central pulmonary vasculature, possibly related to pulmonary arterial hypertension. No acute cardiopulmonary findings. Electronically Signed   By: Ellery Plunkaniel R Mitchell M.D.   On: 02/23/2016 21:05   Dg Chest Port 1 View  02/01/2016  CLINICAL DATA:  Low blood pressure EXAM: PORTABLE CHEST 1 VIEW COMPARISON:  01/23/2016 FINDINGS: Cardiac shadow remains mildly enlarged. Mild central vascular prominence is seen without focal edema. No focal infiltrate is noted. No bony abnormality is seen. IMPRESSION: Mild central vascular prominence similar to that seen on the prior exam. No new focal abnormality is noted. Electronically Signed   By: Alcide CleverMark  Lukens M.D.   On: 02/01/2016 13:41    Lab Results: Basic Metabolic Panel:  Recent Labs  11/91/4704/18/17 1053 02/13/16 0403  NA 136 136  K 4.8 5.0  CL 103 104  CO2 17* 16*  GLUCOSE 112* 89  BUN 62* 61*  CREATININE 3.80* 3.83*  CALCIUM 8.4* 8.5*   Liver Function Tests: No results for input(s): AST, ALT, ALKPHOS, BILITOT, PROT, ALBUMIN in the last 72 hours.   CBC:  Recent Labs  02/13/16 0403  WBC 9.6  HGB 11.8*  HCT 36.6  MCV 98.1  PLT 156    Recent Results (from the past 240 hour(s))  Blood culture (routine x 2)     Status: None   Collection Time: 01/26/2016  7:18 PM  Result Value Ref Range Status   Specimen Description LEFT ANTECUBITAL  Final    Special Requests BOTTLES DRAWN AEROBIC AND ANAEROBIC 6CC  Final   Culture NO GROWTH 6 DAYS  Final   Report Status 02017/07/27 FINAL  Final  Blood culture (routine x 2)     Status: None   Collection Time: 02/06/2016  7:30 PM  Result Value Ref Range Status   Specimen Description BLOOD LEFT WRIST  Final   Special Requests BOTTLES DRAWN AEROBIC ONLY 4CC  Final   Culture NO GROWTH 6 DAYS  Final   Report Status 02017/07/27 FINAL  Final  MRSA PCR Screening     Status: None   Collection Time: 01/28/2016 11:55 PM  Result Value Ref Range Status   MRSA by PCR NEGATIVE NEGATIVE Final    Comment:  The GeneXpert MRSA Assay (FDA approved for NASAL specimens only), is one component of a comprehensive MRSA colonization surveillance program. It is not intended to diagnose MRSA infection nor to guide or monitor treatment for MRSA infections.      Hospital Course: This was a 82 year old who came to the hospital from skilled care facility with hypotension. She was thought to be septic. She also had congestive heart failure and acute on chronic renal failure. She had dementia at baseline. Discussion was undertaken with her husband regarding his wishes for her care and he agreed to antibiotics IV fluids but did not want resuscitation done and did not want her placed on pressors. She had trouble with atrial fibrillation during the hospitalization with rapid ventricular response and this was difficult to treat because she had continued hypotension. She was given IV fluids she was started on amiodarone but did not show much improvement in her renal function and on the day prior to her death she was much more short of breath in appearance had tachypnea and some labored respirations. She died at 03/25/56 with family at bedside  Discharge Exam: Blood pressure 80/70, pulse 94, temperature 95.4 F (35.2 C), temperature source Axillary, resp. rate 34, height  (1.803 m), weight 132.5 kg (292 lb 1.8 oz), SpO2 88  %. Not applicable  Disposition: To funeral home      Signed: Fredirick Maudlin   03/14/16, 7:33 AM

## 2016-02-25 DEATH — deceased

## 2016-03-27 NOTE — Discharge Summary (Signed)
NAME:  Michaela Tyler, Michaela Tyler              ACCOUNT NO.:  [id number]  MEDICAL RECORD NO.:  [id number]  LOCATION:  [facility]                          FACILITY:  [facility]  PHYSICIAN:  Espen Bethel L. Juanetta GoslingHawkins, M.D.DATE OF BIRTH:  [birth year]  DATE OF ADMISSION:  01/31/2016 DATE OF DISCHARGE:  04/21/2017LH                              DISCHARGE SUMMARY   ADDENDUM:  FINAL DISCHARGE DIAGNOSES:  Acute tubular necrosis.     Loa Idler L. Juanetta GoslingHawkins, M.D.     [facility]/MEDQ  D:  03/01/2016  T:  03/02/2016  Job:  [id number]

## 2016-04-24 IMAGING — CR DG CHEST 1V PORT
1 series · 1 of 1 positions shown · non-contrast
Comparison: 01/23/2016

CLINICAL DATA: Low blood pressure

EXAM:
PORTABLE CHEST 1 VIEW

[ap portable]
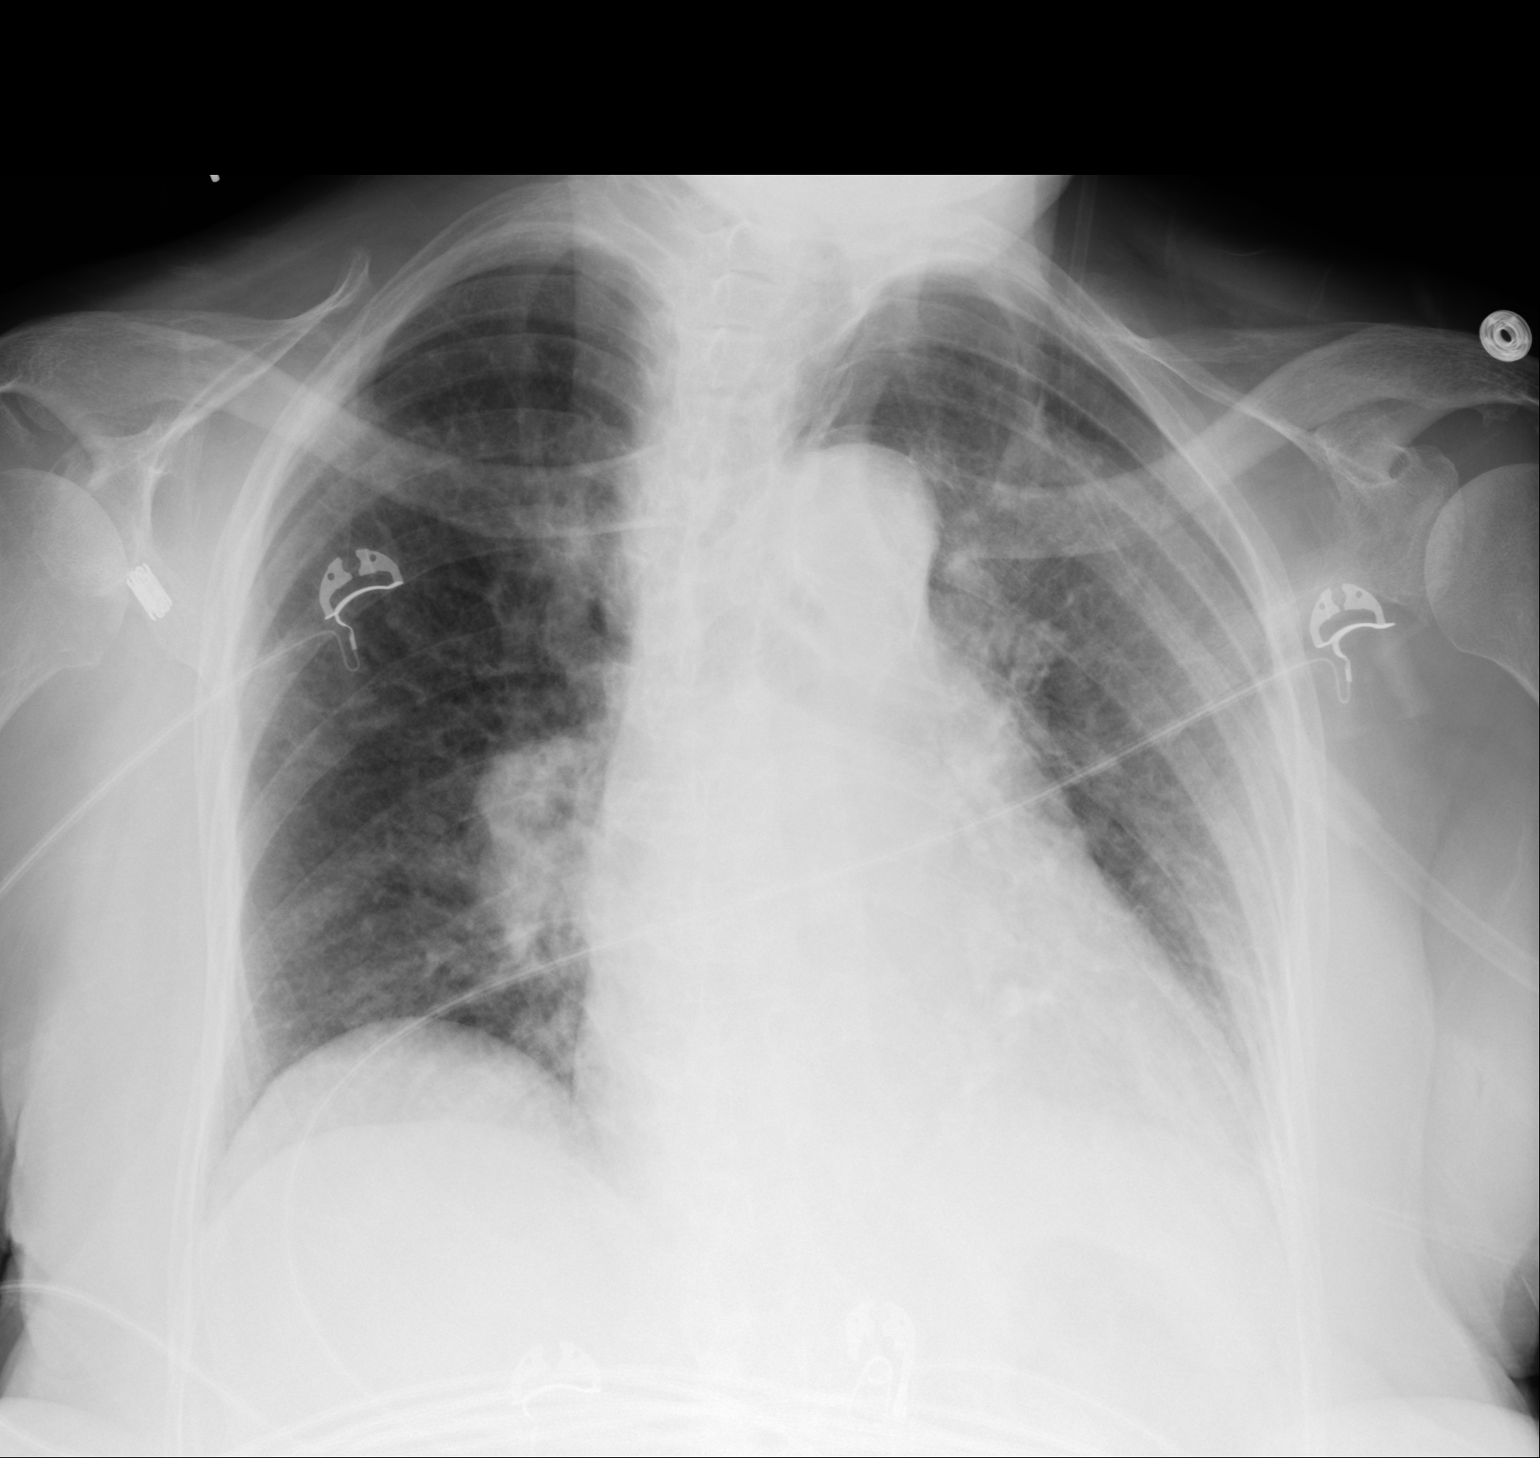

[1 of 1 positions shown; findings below may reference images not displayed]

FINDINGS: Cardiac shadow remains mildly enlarged. Mild central vascular
prominence is seen without focal edema. No focal infiltrate is
noted. No bony abnormality is seen.
IMPRESSION: Mild central vascular prominence similar to that seen on the prior
exam. No new focal abnormality is noted.

## 2016-05-01 IMAGING — CR DG CHEST 1V PORT
1 series · 1 of 1 positions shown · non-contrast
Comparison: 02/01/2016

CLINICAL DATA: Hypotension, dyspnea.

EXAM:
PORTABLE CHEST 1 VIEW

[ap portable]
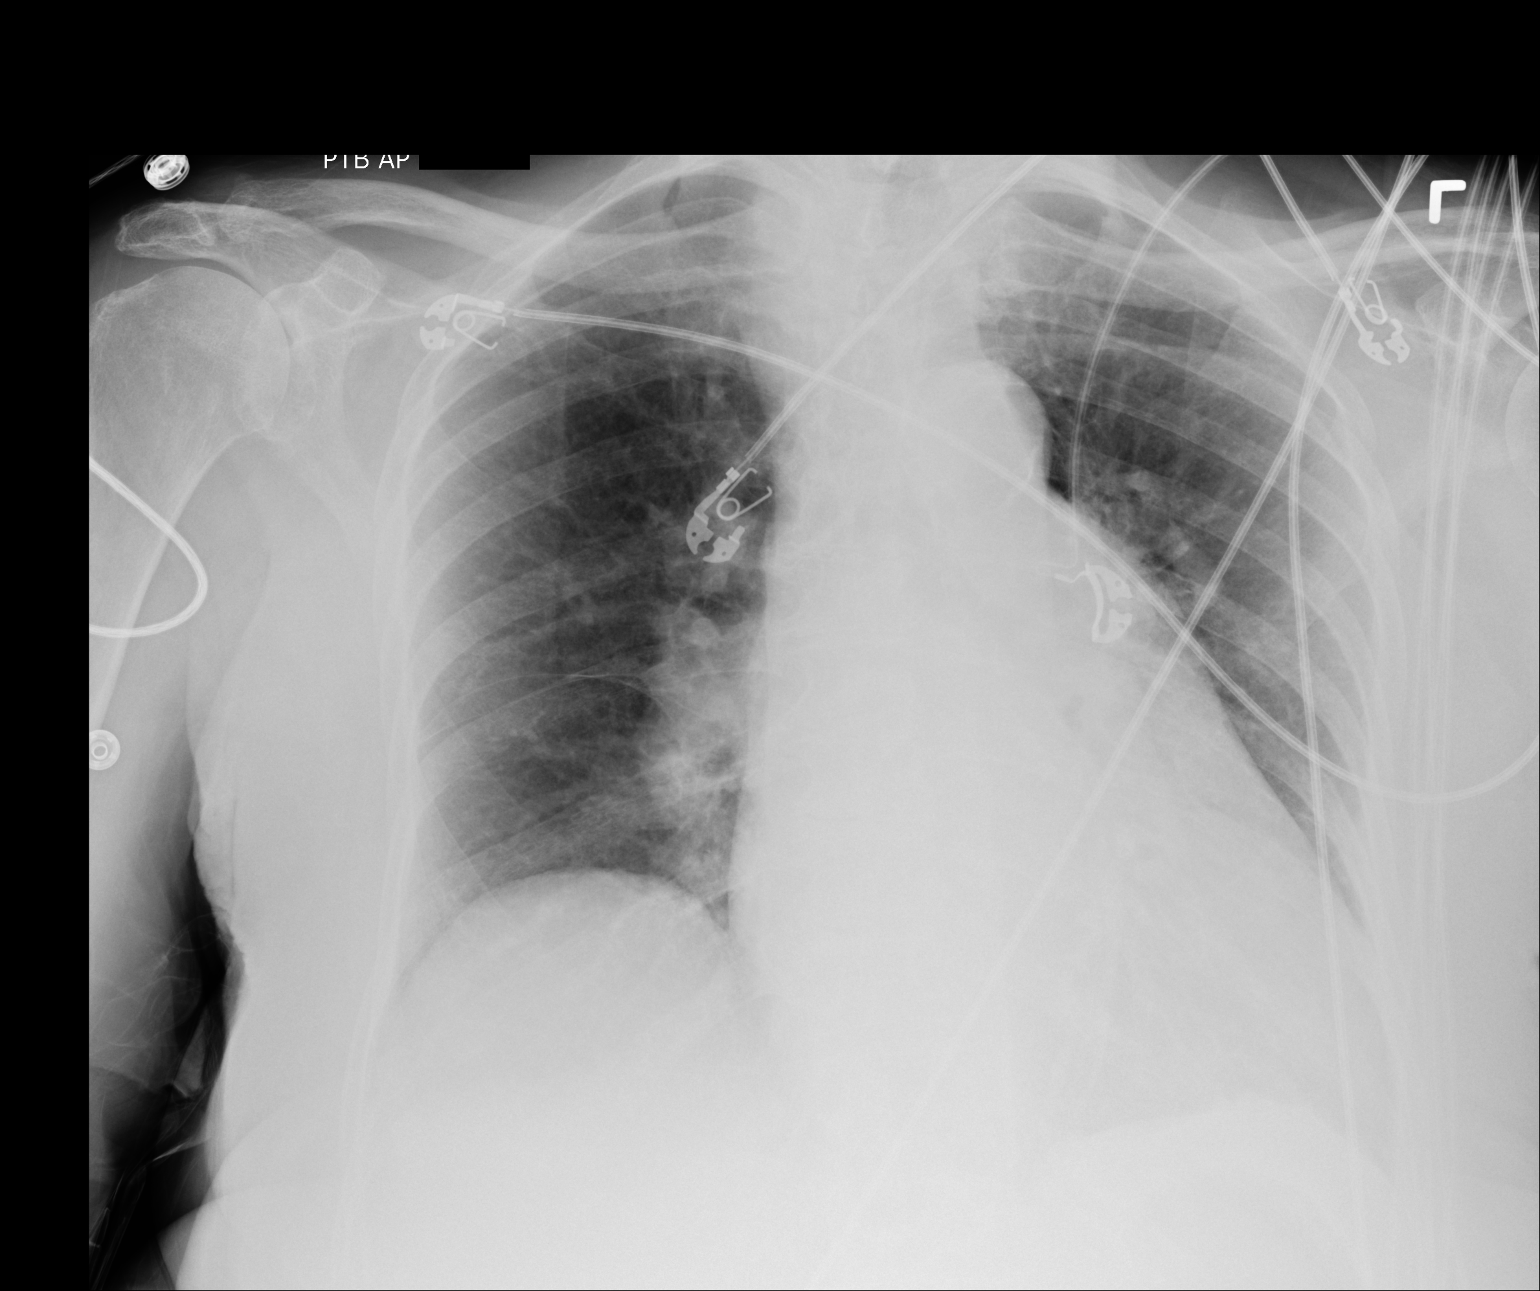

[1 of 1 positions shown; findings below may reference images not displayed]

FINDINGS: Moderate cardiomegaly, unchanged. Elevation of the right
hemidiaphragm, unchanged. Mildly prominent hilar contours, likely
vascular. The lungs are clear. No large effusions. No pneumothorax.
IMPRESSION: Cardiomegaly. Prominent central pulmonary vasculature, possibly
related to pulmonary arterial hypertension. No acute cardiopulmonary
findings.

## 2016-05-07 IMAGING — CR DG CHEST 1V PORT
1 series · 1 of 1 positions shown · non-contrast
Comparison: 02/08/2016

CLINICAL DATA: Respiratory failure.

EXAM:
PORTABLE CHEST 1 VIEW

[ap portable]
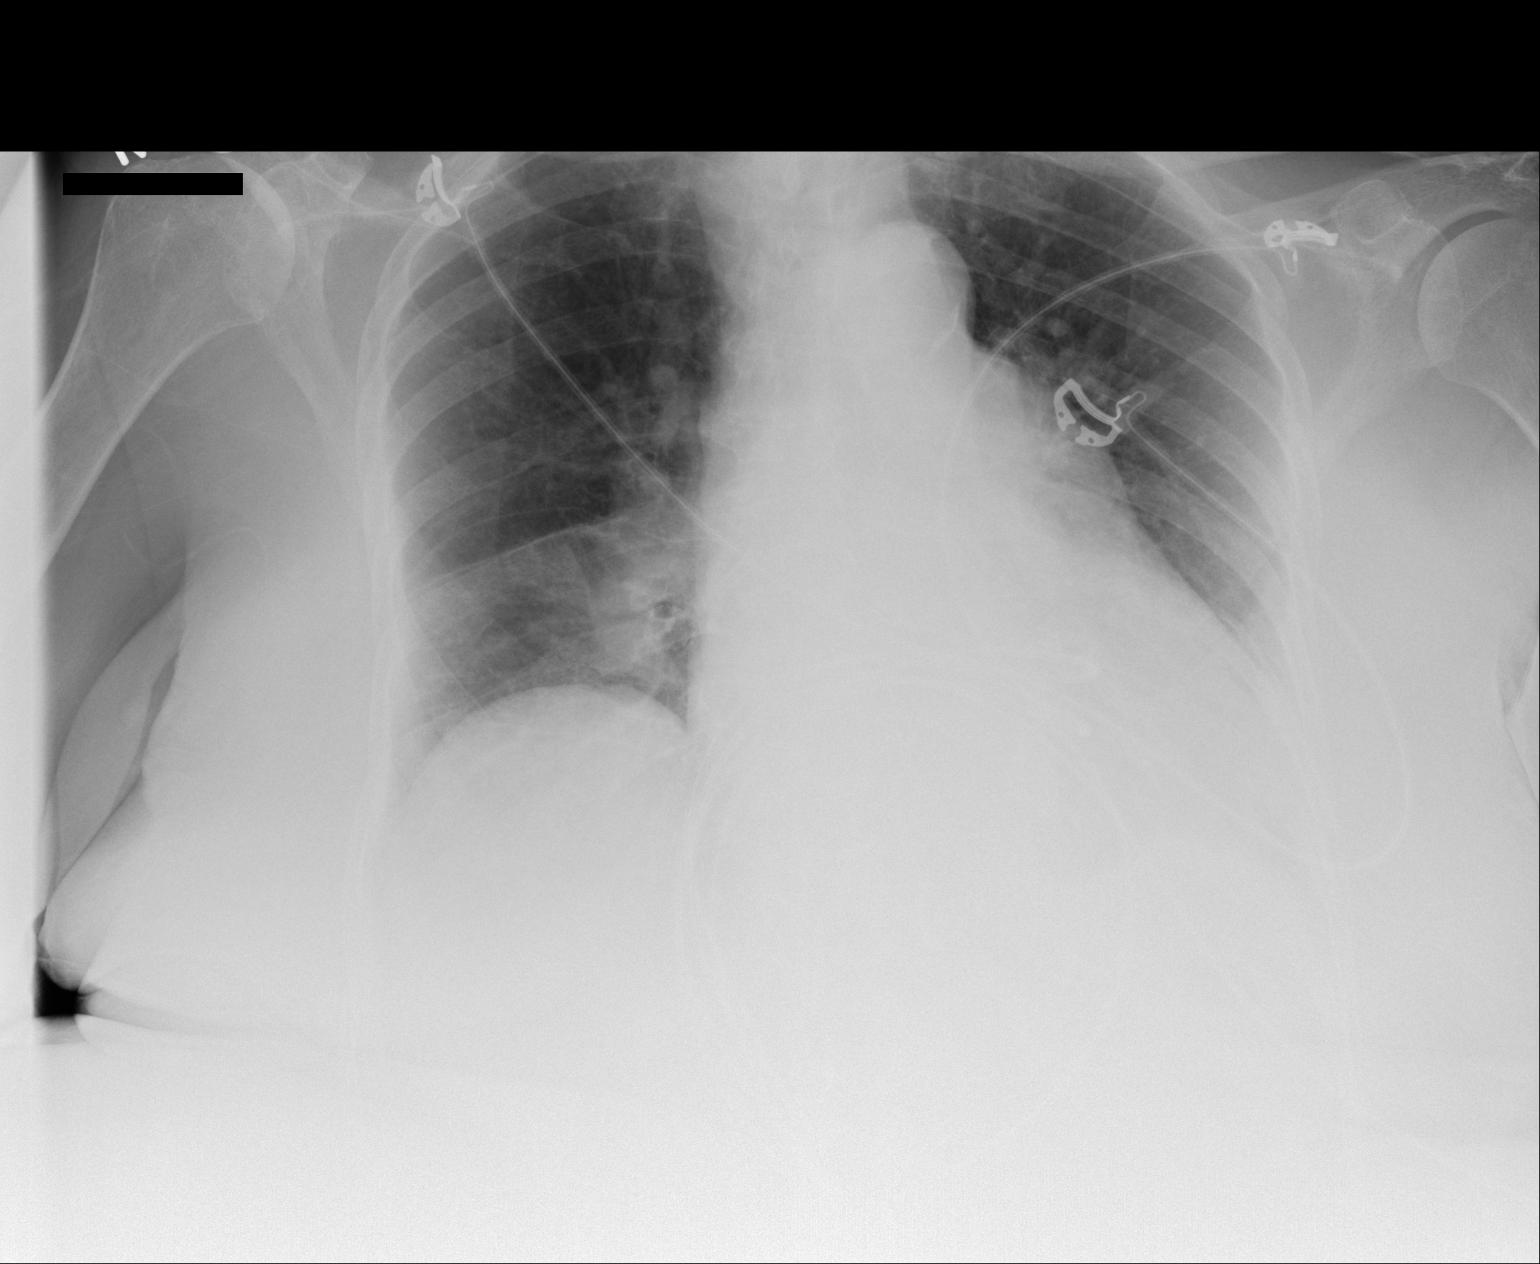

[1 of 1 positions shown; findings below may reference images not displayed]

FINDINGS: Cardiac silhouette is mildly enlarged. No mediastinal or hilar
masses or evidence of adenopathy. Enlargement of the main pulmonary
artery is stable.

No evidence of pneumonia or pulmonary edema. No convincing pleural
effusion and no pneumothorax.

Bony thorax is demineralized but grossly intact.
IMPRESSION: 1. No acute cardiopulmonary disease.
2. Mild cardiomegaly. Enlarged pulmonary arteries again suggesting
an element of pulmonary arterial hypertension.
